# Patient Record
Sex: Female | Born: 1955
Health system: Southern US, Community
[De-identification: ages and names within clinical notes are randomized; demographics above are authoritative.]

## PROBLEM LIST (undated history)

## (undated) DIAGNOSIS — M1990 Unspecified osteoarthritis, unspecified site: Secondary | ICD-10-CM

## (undated) DIAGNOSIS — R011 Cardiac murmur, unspecified: Secondary | ICD-10-CM

## (undated) DIAGNOSIS — L982 Febrile neutrophilic dermatosis [Sweet]: Secondary | ICD-10-CM

## (undated) DIAGNOSIS — F329 Major depressive disorder, single episode, unspecified: Secondary | ICD-10-CM

## (undated) DIAGNOSIS — A64 Unspecified sexually transmitted disease: Secondary | ICD-10-CM

## (undated) DIAGNOSIS — K219 Gastro-esophageal reflux disease without esophagitis: Secondary | ICD-10-CM

## (undated) DIAGNOSIS — F32A Depression, unspecified: Secondary | ICD-10-CM

## (undated) DIAGNOSIS — F419 Anxiety disorder, unspecified: Secondary | ICD-10-CM

## (undated) DIAGNOSIS — S060XAA Concussion with loss of consciousness status unknown, initial encounter: Secondary | ICD-10-CM

## (undated) DIAGNOSIS — K589 Irritable bowel syndrome without diarrhea: Secondary | ICD-10-CM

## (undated) DIAGNOSIS — K602 Anal fissure, unspecified: Secondary | ICD-10-CM

## (undated) DIAGNOSIS — G43909 Migraine, unspecified, not intractable, without status migrainosus: Secondary | ICD-10-CM

## (undated) DIAGNOSIS — Z87442 Personal history of urinary calculi: Secondary | ICD-10-CM

## (undated) DIAGNOSIS — C539 Malignant neoplasm of cervix uteri, unspecified: Secondary | ICD-10-CM

## (undated) DIAGNOSIS — E079 Disorder of thyroid, unspecified: Secondary | ICD-10-CM

## (undated) DIAGNOSIS — Z9289 Personal history of other medical treatment: Secondary | ICD-10-CM

## (undated) DIAGNOSIS — D801 Nonfamilial hypogammaglobulinemia: Secondary | ICD-10-CM

## (undated) DIAGNOSIS — S060X9A Concussion with loss of consciousness of unspecified duration, initial encounter: Secondary | ICD-10-CM

## (undated) DIAGNOSIS — J302 Other seasonal allergic rhinitis: Secondary | ICD-10-CM

## (undated) DIAGNOSIS — E785 Hyperlipidemia, unspecified: Secondary | ICD-10-CM

## (undated) DIAGNOSIS — J45909 Unspecified asthma, uncomplicated: Secondary | ICD-10-CM

## (undated) DIAGNOSIS — B159 Hepatitis A without hepatic coma: Secondary | ICD-10-CM

## (undated) DIAGNOSIS — M359 Systemic involvement of connective tissue, unspecified: Secondary | ICD-10-CM

## (undated) HISTORY — PX: UPPER GASTROINTESTINAL ENDOSCOPY: SHX188

## (undated) HISTORY — DX: Febrile neutrophilic dermatosis (sweet): L98.2

## (undated) HISTORY — PX: COLONOSCOPY: SHX174

## (undated) HISTORY — DX: Disorder of thyroid, unspecified: E07.9

## (undated) HISTORY — DX: Concussion with loss of consciousness of unspecified duration, initial encounter: S06.0X9A

## (undated) HISTORY — DX: Unspecified osteoarthritis, unspecified site: M19.90

## (undated) HISTORY — DX: Unspecified asthma, uncomplicated: J45.909

## (undated) HISTORY — DX: Major depressive disorder, single episode, unspecified: F32.9

## (undated) HISTORY — DX: Systemic involvement of connective tissue, unspecified: M35.9

## (undated) HISTORY — DX: Personal history of urinary calculi: Z87.442

## (undated) HISTORY — DX: Hepatitis a without hepatic coma: B15.9

## (undated) HISTORY — DX: Unspecified sexually transmitted disease: A64

## (undated) HISTORY — PX: ELBOW SURGERY: SHX618

## (undated) HISTORY — DX: Irritable bowel syndrome, unspecified: K58.9

## (undated) HISTORY — DX: Hyperlipidemia, unspecified: E78.5

## (undated) HISTORY — DX: Gastro-esophageal reflux disease without esophagitis: K21.9

## (undated) HISTORY — DX: Cardiac murmur, unspecified: R01.1

## (undated) HISTORY — DX: Malignant neoplasm of cervix uteri, unspecified: C53.9

## (undated) HISTORY — PX: OTHER SURGICAL HISTORY: SHX169

## (undated) HISTORY — DX: Other seasonal allergic rhinitis: J30.2

## (undated) HISTORY — DX: Migraine, unspecified, not intractable, without status migrainosus: G43.909

## (undated) HISTORY — PX: TONSILLECTOMY: SUR1361

## (undated) HISTORY — PX: NASAL SINUS SURGERY: SHX719

## (undated) HISTORY — DX: Anxiety disorder, unspecified: F41.9

## (undated) HISTORY — DX: Concussion with loss of consciousness status unknown, initial encounter: S06.0XAA

## (undated) HISTORY — DX: Nonfamilial hypogammaglobulinemia: D80.1

## (undated) HISTORY — DX: Personal history of other medical treatment: Z92.89

## (undated) HISTORY — PX: FOOT SURGERY: SHX648

## (undated) HISTORY — DX: Depression, unspecified: F32.A

## (undated) HISTORY — DX: Anal fissure, unspecified: K60.2

---

## 1986-07-17 HISTORY — PX: ABDOMINAL HYSTERECTOMY: SHX81

## 1993-07-17 HISTORY — PX: ELBOW SURGERY: SHX618

## 2007-07-18 HISTORY — PX: BREAST LUMPECTOMY: SHX2

## 2007-07-18 LAB — HM COLONOSCOPY

## 2009-05-26 ENCOUNTER — Encounter: Payer: Self-pay | Admitting: Internal Medicine

## 2009-07-30 ENCOUNTER — Encounter: Payer: Self-pay | Admitting: Internal Medicine

## 2009-09-16 ENCOUNTER — Ambulatory Visit: Payer: Self-pay | Admitting: Oncology

## 2009-10-12 ENCOUNTER — Encounter: Payer: Self-pay | Admitting: Internal Medicine

## 2009-10-13 LAB — CBC WITH DIFFERENTIAL/PLATELET
BASO%: 0.5 % (ref 0.0–2.0)
Basophils Absolute: 0 10*3/uL (ref 0.0–0.1)
EOS%: 2.6 % (ref 0.0–7.0)
Eosinophils Absolute: 0.1 10*3/uL (ref 0.0–0.5)
HCT: 39.5 % (ref 34.8–46.6)
HGB: 13.6 g/dL (ref 11.6–15.9)
LYMPH%: 17.7 % (ref 14.0–49.7)
MCH: 34.1 pg — ABNORMAL HIGH (ref 25.1–34.0)
MCHC: 34.4 g/dL (ref 31.5–36.0)
MCV: 99.1 fL (ref 79.5–101.0)
MONO#: 0.5 10*3/uL (ref 0.1–0.9)
MONO%: 9.2 % (ref 0.0–14.0)
NEUT#: 3.4 10*3/uL (ref 1.5–6.5)
NEUT%: 70 % (ref 38.4–76.8)
Platelets: 242 10*3/uL (ref 145–400)
RBC: 3.99 10*6/uL (ref 3.70–5.45)
RDW: 13.3 % (ref 11.2–14.5)
WBC: 4.9 10*3/uL (ref 3.9–10.3)
lymph#: 0.9 10*3/uL (ref 0.9–3.3)

## 2009-10-14 LAB — IGG, IGA, IGM
IgA: 61 mg/dL — ABNORMAL LOW (ref 68–378)
IgG (Immunoglobin G), Serum: 737 mg/dL (ref 694–1618)

## 2009-10-14 LAB — COMPREHENSIVE METABOLIC PANEL
ALT: 14 U/L (ref 0–35)
Albumin: 4.1 g/dL (ref 3.5–5.2)
CO2: 27 mEq/L (ref 19–32)
Calcium: 9.2 mg/dL (ref 8.4–10.5)
Chloride: 102 mEq/L (ref 96–112)
Glucose, Bld: 88 mg/dL (ref 70–99)
Potassium: 4.2 mEq/L (ref 3.5–5.3)
Sodium: 138 mEq/L (ref 135–145)
Total Protein: 6.2 g/dL (ref 6.0–8.3)

## 2009-10-14 LAB — LACTATE DEHYDROGENASE: LDH: 123 U/L (ref 94–250)

## 2009-10-25 ENCOUNTER — Ambulatory Visit (HOSPITAL_COMMUNITY): Admission: RE | Admit: 2009-10-25 | Discharge: 2009-10-25 | Payer: Self-pay | Admitting: Obstetrics & Gynecology

## 2009-10-25 HISTORY — PX: EXCISION VAGINAL CYST: SHX5825

## 2009-10-26 ENCOUNTER — Encounter (INDEPENDENT_AMBULATORY_CARE_PROVIDER_SITE_OTHER): Payer: Self-pay | Admitting: *Deleted

## 2009-10-26 DIAGNOSIS — D801 Nonfamilial hypogammaglobulinemia: Secondary | ICD-10-CM

## 2009-10-26 DIAGNOSIS — C539 Malignant neoplasm of cervix uteri, unspecified: Secondary | ICD-10-CM | POA: Insufficient documentation

## 2009-10-26 DIAGNOSIS — J45909 Unspecified asthma, uncomplicated: Secondary | ICD-10-CM

## 2009-10-26 DIAGNOSIS — M545 Low back pain: Secondary | ICD-10-CM

## 2009-10-26 DIAGNOSIS — J329 Chronic sinusitis, unspecified: Secondary | ICD-10-CM | POA: Insufficient documentation

## 2009-10-26 DIAGNOSIS — E039 Hypothyroidism, unspecified: Secondary | ICD-10-CM | POA: Insufficient documentation

## 2009-10-26 DIAGNOSIS — Z8719 Personal history of other diseases of the digestive system: Secondary | ICD-10-CM | POA: Insufficient documentation

## 2009-10-26 DIAGNOSIS — Z87442 Personal history of urinary calculi: Secondary | ICD-10-CM | POA: Insufficient documentation

## 2009-11-02 ENCOUNTER — Ambulatory Visit: Payer: Self-pay | Admitting: Internal Medicine

## 2009-11-02 DIAGNOSIS — Z8541 Personal history of malignant neoplasm of cervix uteri: Secondary | ICD-10-CM | POA: Insufficient documentation

## 2009-11-03 ENCOUNTER — Encounter: Payer: Self-pay | Admitting: Internal Medicine

## 2009-11-10 ENCOUNTER — Ambulatory Visit: Payer: Self-pay | Admitting: Oncology

## 2009-11-11 ENCOUNTER — Encounter: Payer: Self-pay | Admitting: Internal Medicine

## 2009-12-23 ENCOUNTER — Ambulatory Visit: Payer: Self-pay | Admitting: Oncology

## 2009-12-27 ENCOUNTER — Encounter: Payer: Self-pay | Admitting: Internal Medicine

## 2009-12-27 LAB — IGG, IGA, IGM
IgA: 61 mg/dL — ABNORMAL LOW (ref 68–378)
IgG (Immunoglobin G), Serum: 799 mg/dL (ref 694–1618)
IgM, Serum: 52 mg/dL — ABNORMAL LOW (ref 60–263)

## 2009-12-31 LAB — IGG SUBCLASS 4
IgA: 61 mg/dL — ABNORMAL LOW (ref 68–378)
IgE (Immunoglobulin E), Serum: 12.4 IU/mL (ref 0.0–180.0)
IgG (Immunoglobin G), Serum: 799 mg/dL (ref 694–1618)
IgG Subclass 1: 438 mg/dL (ref 382–929)
IgG Subclass 2: 254 mg/dL (ref 241–700)
IgM, Serum: 52 mg/dL — ABNORMAL LOW (ref 60–263)

## 2010-02-07 ENCOUNTER — Ambulatory Visit: Payer: Self-pay | Admitting: Oncology

## 2010-03-03 ENCOUNTER — Encounter: Payer: Self-pay | Admitting: Internal Medicine

## 2010-03-03 LAB — IGG, IGA, IGM
IgA: 56 mg/dL — ABNORMAL LOW (ref 68–378)
IgG (Immunoglobin G), Serum: 1100 mg/dL (ref 694–1618)
IgM, Serum: 43 mg/dL — ABNORMAL LOW (ref 60–263)

## 2010-03-30 ENCOUNTER — Ambulatory Visit: Payer: Self-pay | Admitting: Internal Medicine

## 2010-04-01 ENCOUNTER — Ambulatory Visit: Payer: Self-pay | Admitting: Oncology

## 2010-04-04 ENCOUNTER — Encounter: Payer: Self-pay | Admitting: Internal Medicine

## 2010-04-09 ENCOUNTER — Emergency Department (HOSPITAL_COMMUNITY): Admission: EM | Admit: 2010-04-09 | Discharge: 2010-04-09 | Payer: Self-pay | Admitting: Emergency Medicine

## 2010-04-11 ENCOUNTER — Telehealth: Payer: Self-pay | Admitting: Internal Medicine

## 2010-05-02 ENCOUNTER — Ambulatory Visit: Payer: Self-pay | Admitting: Oncology

## 2010-05-03 ENCOUNTER — Encounter: Payer: Self-pay | Admitting: Internal Medicine

## 2010-05-03 LAB — CBC WITH DIFFERENTIAL/PLATELET
BASO%: 0.3 % (ref 0.0–2.0)
Basophils Absolute: 0 10*3/uL (ref 0.0–0.1)
EOS%: 3.8 % (ref 0.0–7.0)
Eosinophils Absolute: 0.2 10*3/uL (ref 0.0–0.5)
HCT: 43.5 % (ref 34.8–46.6)
HGB: 14.4 g/dL (ref 11.6–15.9)
LYMPH%: 12.1 % — ABNORMAL LOW (ref 14.0–49.7)
MCH: 32.1 pg (ref 25.1–34.0)
MCHC: 33.2 g/dL (ref 31.5–36.0)
MCV: 96.8 fL (ref 79.5–101.0)
MONO#: 0.4 10*3/uL (ref 0.1–0.9)
MONO%: 7 % (ref 0.0–14.0)
NEUT#: 4.6 10*3/uL (ref 1.5–6.5)
NEUT%: 76.8 % (ref 38.4–76.8)
Platelets: 277 10*3/uL (ref 145–400)
RBC: 4.49 10*6/uL (ref 3.70–5.45)
RDW: 13.4 % (ref 11.2–14.5)
WBC: 6 10*3/uL (ref 3.9–10.3)
lymph#: 0.7 10*3/uL — ABNORMAL LOW (ref 0.9–3.3)

## 2010-06-03 ENCOUNTER — Ambulatory Visit: Payer: Self-pay | Admitting: Oncology

## 2010-06-16 ENCOUNTER — Encounter: Payer: Self-pay | Admitting: Internal Medicine

## 2010-06-28 ENCOUNTER — Ambulatory Visit: Payer: Self-pay | Admitting: Internal Medicine

## 2010-07-21 ENCOUNTER — Telehealth: Payer: Self-pay | Admitting: Internal Medicine

## 2010-07-22 ENCOUNTER — Ambulatory Visit: Payer: Self-pay | Admitting: Oncology

## 2010-07-26 ENCOUNTER — Encounter: Payer: Self-pay | Admitting: Internal Medicine

## 2010-07-26 LAB — IGG, IGA, IGM
IgA: 69 mg/dL (ref 68–378)
IgG (Immunoglobin G), Serum: 976 mg/dL (ref 694–1618)
IgM, Serum: 36 mg/dL — ABNORMAL LOW (ref 60–263)

## 2010-08-08 ENCOUNTER — Telehealth: Payer: Self-pay | Admitting: Internal Medicine

## 2010-08-12 ENCOUNTER — Telehealth: Payer: Self-pay | Admitting: Internal Medicine

## 2010-08-16 NOTE — Miscellaneous (Signed)
Summary: HIPAA Restrictions  HIPAA Restrictions   Imported By: Florinda Marker 11/02/2009 16:19:35  _____________________________________________________________________  External Attachment:    Type:   Image     Comment:   External Document

## 2010-08-16 NOTE — Assessment & Plan Note (Signed)
Summary: new pt recurrent sinus infections   CC:  auto-immunie disorder.  History of Present Illness: Ms. Emily Calhoun is a 55 year old who was referred to me by Dr. 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  She says that she has had problems with recurrent upper respiratory infections for all of her life.  She never knew exactly why until several years ago when she was diagnosed with hypogammaglobulinemia after one of her sons had been found to have the same condition.  She also has one son who has severe hypersensitivity reactions to multiple medications.  She recently moved here from [place] after her husband took a job as Publishing copy with [employer].  I do not have her records from [place].  She recalls being told that her IgA and IgM levels were low and that some other blood tests was hyper or elevated. Shortly after she was diagnosed she was started on intravenous immunoglobulin therapy. She developed hypersensitivity reaction after her initial dose and underwent desensitization.  She has been able to tolerate subsequent doses relatively well except for fatigue for a few days after each dose.Initially she was receiving the immunoglobulin therapy every two weeks.  She recalls having trough levels drawn occasionally before her immunoglobulin was confused.  She began to develop problems with access and had a Port-A-Cath placed about one year ago. Her last dose of IV Ig was in January shortly before she moved. Dr. Shanon Ace, the physician overseeing her therapy in [place] had recommended changing her dosing to once monthly shortly before she moved.  She also recalls some discussion of possibly stopping in the future but no specific decision was made.  She says that she definitely notes improvement since starting the IV Ig.  She feels like the frequency of respiratory infections has been decreased by at least 50%.  She is very concerned about being off of it now and  wants to restart.  Although her respiratory infections in the past have not been life-threatening and she's never had pneumonia, she is very worried about developing infections in the future, especially since she has severe allergies to so many antibiotics.  She has a history of anaphylaxis with diffuse severe itching and throat swelling with metronidazole, azithromycin, clarithromycin, erythromycin, aminoglycosides, vancomycin, levofloxacin, ciprofloxacin, sulfa agents, and penicillins, cephalosporins, ketolides and telithromycin.   Although she has a history of allergies to other quinolone agents she says that she can tolerate moxifloxacin.  She says she can also tolerate tetracycline (she had a bad sun hypersensitivity reaction while on minocycline) and clindamycin.  She is not sure if she is ever been treated with carbapenams or aztreonam.  She has had multiple severe sinus infections and has required 3 separate sinus surgeries.  She had an Aspergillus sinus infection several years ago and that is when she was diagnosed with hypogammaglobulinemia.  She is not sure how she was treated for that infection.  She says she has had one sinus infection since her last dose of IV Ig.  She says that manifested as frontal headache and she notes that" she probably should have had an MRI".  However the headache resolves spontaneously without any specific therapy.  She is due to see Dr. Osborn Coho tomorrow to establish ENT therapy, she is also seeing Dr. Paulino Rily for her primary care needs.  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  She says she cannot recall the last episode  but it sounds like it's been several years.  when she was in to see Dr. Truett Perna recently her immunoglobulin levels were checked.  Her IgG was normal at 737 but her IgA was low at 61 and her IgM was low at 43.  Her CBC and complete metabolic panels were  normal.  She says she has received a pneumococcal vaccine but does not know when the last dose was given.  She always receives seasonal flu vaccine.  Preventive Screening-Counseling & Management  Alcohol-Tobacco     Alcohol drinks/day: 2     Alcohol type: wine     Smoking Status: never  Caffeine-Diet-Exercise     Caffeine use/day: yes     Does Patient Exercise: yes  Safety-Violence-Falls     Seat Belt Use: yes   Current Allergies (reviewed today): ! FLAGYL (METRONIDAZOLE) ! GENTAMICIN SULFATE (GENTAMICIN SULFATE) ! MORPHINE SULFATE (MORPHINE SULFATE) ! LEVAQUIN (LEVOFLOXACIN) ! * MACROLIDES ! SULFA ! PCN ! KEFLEX (CEPHALEXIN) ! CEPHALOSPORINS ! ZITHROMAX (AZITHROMYCIN) ! BIAXIN (CLARITHROMYCIN) ! VANCOCIN HCL (VANCOMYCIN HCL) ! TOBRADEX (TOBRAMYCIN-DEXAMETHASONE) ! KETOCONAZOLE Past History:  Past Medical History: Asthma Cervical cancer, hx of Hypothyroidism Low back pain  Review of Systems       The patient complains of decreased hearing.  The patient denies anorexia, weight loss, weight gain, dyspnea on exertion, prolonged cough, hemoptysis, and enlarged lymph nodes.    Vital Signs:  Patient profile:   55 year old female Height:      62 inches (157.48 cm) Weight:      148.5 pounds (67.50 kg) BMI:     27.26 Temp:     97.8 degrees F oral Pulse rate:   76 / minute BP sitting:   112 / 78  (left arm) Cuff size:   regular  Vitals Entered By: Jennet Maduro RN (November 02, 2009 2:15 PM) CC: auto-immunie disorder Is Patient Diabetic? No Pain Assessment Patient in pain? no      Nutritional Status BMI of 25 - 29 = overweight Nutritional Status Detail appetite "OK"  Have you ever been in a relationship where you felt threatened, hurt or afraid?not asked   Does patient need assistance? Functional Status Self care Ambulation Normal   Physical Exam  General:  alert and overweight-appearing.   Eyes:  vision grossly intact, pupils equal, and pupils round.    Ears:  R ear normal and L ear normal.   Mouth:  good dentition, pharynx pink and moist, no erythema, and no exudates.   Lungs:  normal breath sounds, no crackles, and no wheezes.   Heart:  normal rate, regular rhythm, and no murmur.   Skin:  no rashes and no suspicious lesions.   Cervical Nodes:  no anterior cervical adenopathy and no posterior cervical adenopathy.   Axillary Nodes:  no R axillary adenopathy and no L axillary adenopathy.   Psych:  normally interactive, good eye contact, not anxious appearing, and not depressed appearing.     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  She does meet current guidelines for IVIG therapy.  It would be helpful for me to review Dr. Rhys Martini records and I have requested them from Dr. Truett Perna. I would recommend continuing the dose that she was receiving in [place] and check periodic trough levels in order to determine frequency of dosing. Orders: Consultation Level IV (19147)  Patient Instructions: 1)  Please schedule a follow-up appointment in 6 weeks.  Prevention & Chronic Care Immunizations   Influenza vaccine: Not documented    Tetanus booster: Not documented    Pneumococcal vaccine: Not documented  Colorectal Screening   Hemoccult: Not documented    Colonoscopy: Not documented  Other Screening   Pap smear: Not documented    Mammogram: Not documented   Smoking status: never  (11/02/2009)  Lipids   Total Cholesterol: Not documented   LDL: Not documented   LDL Direct: Not documented   HDL: Not documented   Triglycerides: Not documented

## 2010-08-16 NOTE — Consult Note (Signed)
Summary: [facility] New Pt. Eval.  [facility]   Imported By: Florinda Marker 01/27/2010 08:47:55  _____________________________________________________________________  External Attachment:    Type:   Image     Comment:   External Document

## 2010-08-16 NOTE — Consult Note (Signed)
Summary: New Pt. Referral: [facility]  New Pt. Referral: [facility]   Imported By: Florinda Marker 11/11/2009 09:12:50  _____________________________________________________________________  External Attachment:    Type:   Image     Comment:   External Document

## 2010-08-16 NOTE — Consult Note (Signed)
[facility]   Imported By: Florinda Marker 01/12/2010 15:26:29  _____________________________________________________________________  External Attachment:    Type:   Image     Comment:   External Document

## 2010-08-16 NOTE — Consult Note (Signed)
[facility]: [facility]   Imported By: Florinda Marker 03/31/2010 10:20:09  _____________________________________________________________________  External Attachment:    Type:   Image     Comment:   External Document

## 2010-08-16 NOTE — Miscellaneous (Signed)
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) Added new problem of ASTHMA, EXERCISE INDUCED (ICD-493.81) Added new problem of LOW BACK PAIN, CHRONIC (ICD-724.2) Added new problem of PERSONAL HISTORY OF URINARY CALCULI (ICD-V13.01) Added new problem of HEMORRHOIDS, HX OF (ICD-V12.79) Added new problem of CERVICAL CANCER (ICD-180.9) - Stage II, Hysterectomy in 1988 Added new problem of HYPOTHYROIDISM (ICD-244.9) Added new problem of UNSPECIFIED HEPATITIS (ICD-573.3) - hx of Hep A Medications: Added new medication of SUMATRIPTAN SUCCINATE 25 MG TABS (SUMATRIPTAN SUCCINATE) as needed per PCP Added new medication of VENTOLIN HFA 108 (90 BASE) MCG/ACT AERS (ALBUTEROL SULFATE) Two puffs two times a day per PCP Added new medication of ALVESCO 80 MCG/ACT AERS (CICLESONIDE) two puffs two times a day per PCP Added new medication of DIPHENHYDRAMINE HCL 50 MG CAPS (DIPHENHYDRAMINE HCL) Take 1 capsule by mouth two times a day as needed per PCP Added new medication of DOK 100 MG CAPS (DOCUSATE SODIUM) Take 1 capsule by mouth two times a day as needed per PCP Added new medication of LEXAPRO 20 MG TABS (ESCITALOPRAM OXALATE) Take 1 tablet by mouth once a day per PCP Added new medication of ESTRADERM 0.05 MG/24HR PTTW (ESTRADIOL) Apply 0.075 combination patches two times a day per PCP Added new medication of FAMOTIDINE 20 MG TABS (FAMOTIDINE) Take 1 tablet by mouth two times a day as needed per PCP Added new medication of LEVOTHYROXINE SODIUM 50 MCG TABS (LEVOTHYROXINE SODIUM) Take 1 tablet by mouth once a day per PCP Added new medication of VALACYCLOVIR HCL 500 MG TABS (VALACYCLOVIR HCL) Take 1 tablet by mouth once a day per PCP Added new medication of APAP 325 MG TABS (ACETAMINOPHEN) as needed per PCP Added new medication of CLONAZEPAM 1 MG TABS  (CLONAZEPAM) Take 1 tablet by mouth two times a day, uses 2-3 days prior to IVIG infusions per PCP Added new medication of CLINDAMYCIN PHOSPHATE 1 % GEL (CLINDAMYCIN PHOSPHATE) Apply two times a day as needed per PCP Allergies: Added new allergy or adverse reaction of FLAGYL (METRONIDAZOLE) Added new allergy or adverse reaction of GENTAMICIN SULFATE (GENTAMICIN SULFATE) Added new allergy or adverse reaction of MORPHINE SULFATE (MORPHINE SULFATE) Added new allergy or adverse reaction of LEVAQUIN (LEVOFLOXACIN) Added new allergy or adverse reaction of * MACROLIDES Added new allergy or adverse reaction of SULFA Added new allergy or adverse reaction of PCN Added new allergy or adverse reaction of KETEK (TELITHROMYCIN) Added new allergy or adverse reaction of CEPHALOSPORINS Added new allergy or adverse reaction of ZITHROMAX (AZITHROMYCIN) Added new allergy or adverse reaction of BIAXIN (CLARITHROMYCIN) Added new allergy or adverse reaction of VANCOCIN HCL (VANCOMYCIN HCL) Added new allergy or adverse reaction of TOBRADEX (TOBRAMYCIN-DEXAMETHASONE) Added new allergy or adverse reaction of KETOCONAZOLE Observations: Added new observation of NKA: F (10/26/2009 12:00)

## 2010-08-16 NOTE — Progress Notes (Signed)
Summary: Side effects from therapy.  Phone Note Call from Patient Call back at [facility] Phone [phone]   Caller: Patient Call For: Dr. Cliffton Asters Reason for Call: Acute Illness, Talk to Doctor Summary of Call: Messge left by pt.  Seen in ED for side effects from treatments.  Wanting to talk to MD about this problem.  MD notified of pt. request.  Jennet Maduro RN  April 11, 2010 2:30 PM   Follow-up for Phone Call        I called her number twice but it said the number had been changed or disconnected. Follow-up by: Cliffton Asters MD,  April 11, 2010 5:29 PM  Additional Follow-up for Phone Call Additional follow up Details #1::        I spoke with Emily Calhoun this morning.  She says that she had much worse symptoms and headache after her last IVIG infusion and is considering postponing her next confusion.  She feels like she just cannot tolerate the side effects.  I told her that she is already on so many premedications and I would not know what to add at this point.  I suggested that she talk to Dr. Truett Perna about taking some time off with the infusions. Additional Follow-up by: Cliffton Asters MD,  April 12, 2010 10:53 AM

## 2010-08-16 NOTE — Consult Note (Signed)
[facility]   Imported By: Florinda Marker 05/20/2010 11:16:42  _____________________________________________________________________  External Attachment:    Type:   Image     Comment:   External Document

## 2010-08-16 NOTE — Consult Note (Signed)
Summary: [facility] [place]: Health Care   Imported By: Florinda Marker 01/27/2010 15:23:59  _____________________________________________________________________  External Attachment:    Type:   Image     Comment:   External Document

## 2010-08-16 NOTE — Consult Note (Signed)
Summary: [facility]   Imported By: Florinda Marker 11/11/2009 11:45:16  _____________________________________________________________________  External Attachment:    Type:   Image     Comment:   External Document

## 2010-08-16 NOTE — Consult Note (Signed)
Summary: [facility] [place]: [facility]  [facility] [place]: [facility] Note   Imported By: Florinda Marker 01/27/2010 15:54:29  _____________________________________________________________________  External Attachment:    Type:   Image     Comment:   External Document

## 2010-08-16 NOTE — Consult Note (Signed)
[facility]   Imported By: Florinda Marker 01/27/2010 09:28:53  _____________________________________________________________________  External Attachment:    Type:   Image     Comment:   External Document

## 2010-08-16 NOTE — Assessment & Plan Note (Signed)
Summary: F/U OV/VS   CC:  follow-up visit.  History of Present Illness: Ms. Emily Calhoun is in for her second visit with me.  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  Her condition is further complicated by multiple antibiotic allergies.  She was initially diagnosed and treated in [place] by Emily Calhoun in [place]. She did not tolerate her initial dose of IVIG. Thereafter,  she was managed with very small doses of IVIG following intensive premedication with prochlorperazine, acetaminophen, diphenhydramine, famotidine, cetirizine, meperidine, acetazolamide, sumatriptan, ondansetron, albuterol inhalers, clonazepam and Solu-Medrol.  With this, she was able to tolerate gradually escalating doses on a two week schedule.  It appears the trough immunoglobulin levels were normal in February of this year before she moved here to [place].  After her visit with me in April she resumed IVIG therapy at the [facility] under the direction of Dr. Mancel Calhoun. She  tells me that she does take clonazepam for each monthly dose.  I do not see any indication in Dr. Kalman Calhoun notes that she is receiving any other premedication.  She says that after her last few doses she has been bothered by severe headaches, nausea, fatigue and severe constipation.  She says that this lasts for 5 to 7 days and is very debilitating.  It appears that trough immunoglobulin levels drawn before her last dose of IVIG on August 18 showed a slightly low IgA level of 56, normal IgG level of 1100, and slightly low IgM level of 43.  She does feel that the IVIG infusions have reduced her infections.  She is worried that if she stopped them or reduce the frequency of her infusions she might start to have severe infections again that would be difficult to manage, especially since she has so few antibiotics that she can tolerate.  However she admits that  she does consider reducing the frequency of her infusions in order to reduce the number of days of postinfusion side effects.   Current Allergies (reviewed today): ! FLAGYL (METRONIDAZOLE) ! GENTAMICIN SULFATE (GENTAMICIN SULFATE) ! MORPHINE SULFATE (MORPHINE SULFATE) ! LEVAQUIN (LEVOFLOXACIN) ! * MACROLIDES ! SULFA ! PCN ! KEFLEX (CEPHALEXIN) ! CEPHALOSPORINS ! ZITHROMAX (AZITHROMYCIN) ! BIAXIN (CLARITHROMYCIN) ! VANCOCIN HCL (VANCOMYCIN HCL) ! TOBRADEX (TOBRAMYCIN-DEXAMETHASONE) ! KETOCONAZOLE Vital Signs:  Patient profile:   55 year old female Height:      62 inches (157.48 cm) Weight:      143.25 pounds (65.11 kg) BMI:     26.30 Temp:     98.3 degrees F (36.83 degrees C) oral Pulse rate:   87 / minute BP sitting:   120 / 88  (left arm) Cuff size:   regular  Vitals Entered By: Emily Maduro RN (March 30, 2010 3:40 PM) CC: follow-up visit Is Patient Diabetic? No Pain Assessment Patient in pain? yes     Location: sinus pain Intensity: 4 Type: sorenessin Onset of pain  Intermittent Nutritional Status BMI of 25 - 29 = overweight Nutritional Status Detail appetite "fine"  Have you ever been in a relationship where you felt threatened, hurt or afraid?not asked   Does patient need assistance? Functional Status Self care Ambulation Normal   Physical Exam  General:  alert and well-nourished.   Mouth:  good dentition, pharynx pink and moist, no erythema, and no exudates.   Lungs:  normal breath sounds, no crackles, and no wheezes.   Heart:  normal rate, regular rhythm,  and no murmur.   Psych:  normally interactive, good eye contact, and not depressed appearing.  she is slightly anxious.   Impression & Recommendations:  Problem # 1:  HYPOGAMMAGLOBULINEMIA (ICD-279.00) Emily Calhoun has slightly low trough IgM and IgA levels on monthly IVIG but is not having problems with infections. I spoke with Emily Calhoun, her immunologist/hematologist, in [place]  [phone]). He suggested continuing the monthly dosing since she does have problems tolerating the infusions. I spoke with Emily Calhoun and he agrees with staying with monthly infusions. She is receiving the same, extensive premedications here that she was getting in [place].  Orders: Est. Patient Level IV (09811)  Medications Added to Medication List This Visit: 1)  Monthily Ivig  2)  Promethazine Hcl 25 Mg Tabs (Promethazine hcl) .... As needed 3)  Promethazine Hcl 50 Mg Supp (Promethazine hcl) .... As needed  Patient Instructions: 1)  Please schedule a follow-up appointment in 3 months.

## 2010-08-18 ENCOUNTER — Encounter: Payer: Self-pay | Admitting: Internal Medicine

## 2010-08-18 ENCOUNTER — Encounter (INDEPENDENT_AMBULATORY_CARE_PROVIDER_SITE_OTHER): Payer: 59 | Admitting: Internal Medicine

## 2010-08-18 DIAGNOSIS — D801 Nonfamilial hypogammaglobulinemia: Secondary | ICD-10-CM

## 2010-08-18 DIAGNOSIS — J309 Allergic rhinitis, unspecified: Secondary | ICD-10-CM | POA: Insufficient documentation

## 2010-08-18 DIAGNOSIS — J329 Chronic sinusitis, unspecified: Secondary | ICD-10-CM

## 2010-08-18 NOTE — Progress Notes (Signed)
Summary: Pt. verbalized that she has "viral head cold"  Phone Note Call from Patient Call back at Home Phone [phone]   Caller: Patient Call For: Cliffton Asters MD Reason for Call: Acute Illness Summary of Call: Pt. left message that she completed the antibiotic.  She was seen by Dr. Annalee Genta who did a "culture" which came back negative.  Then, on Jan 19 she started having symptoms of a "viral" infection.  She is not asking for medication at this time but wanted the "viral" infection noted in her chart.  RN returned phone call.  Pt. verbalized that starting Jan. 19 she had a headache, sorethroat, fever up to 101F and drainage down the back of her throat.  Again, she believes this to be "viral."  She verbalized that she is beginning to feel better, just sluggish and tired now.  Jennet Maduro RN  August 08, 2010 9:06 AM

## 2010-08-18 NOTE — Assessment & Plan Note (Signed)
Summary: F/U/VS   [facility]  [facility]  Last seen in November.  History of Present Illness: Emily Calhoun is in for her routine visit.  After her last visit with me in September she decided to stop her monthly IVIG infusions due to the troublesome side effects.  She has had one upper respiratory infection since that time.  She had some low-grade fevers cough productive of yellow sputum and some sinus congestion.  She used her intranasal steroids and managed it symptomatically with complete resolution of symptoms in about 7 days.  She did not require antibiotics.  She has had her annual influenza vaccine.  Preventive Screening-Counseling & Management  Alcohol-Tobacco     Alcohol drinks/day: 2     Alcohol type: wine     Smoking Status: never  Caffeine-Diet-Exercise     Caffeine use/day: yes     Does Patient Exercise: yes  Safety-Violence-Falls     Seat Belt Use: yes   Updated Prior Medication List: SUMATRIPTAN SUCCINATE 25 MG TABS (SUMATRIPTAN SUCCINATE) as needed per PCP VENTOLIN HFA 108 (90 BASE) MCG/ACT AERS (ALBUTEROL SULFATE) Two puffs two times a day per PCP ALVESCO 80 MCG/ACT AERS (CICLESONIDE) two puffs two times a day per PCP DIPHENHYDRAMINE HCL 50 MG CAPS (DIPHENHYDRAMINE HCL) Take 1 capsule by mouth two times a day as needed per PCP DOK 100 MG CAPS (DOCUSATE SODIUM) Take 1 capsule by mouth two times a day as needed per PCP LEXAPRO 20 MG TABS (ESCITALOPRAM OXALATE) Take 1 tablet by mouth once a day per PCP ESTRADERM 0.05 MG/24HR PTTW (ESTRADIOL) Apply 0.075 combination patches two times a day per PCP FAMOTIDINE 20 MG TABS (FAMOTIDINE) Take 1 tablet by mouth two times a day as needed per PCP LEVOTHYROXINE SODIUM 50 MCG TABS (LEVOTHYROXINE SODIUM) Take 1 tablet by mouth once a day per PCP VALACYCLOVIR HCL 500 MG TABS (VALACYCLOVIR HCL) Take 1 tablet by mouth once a day per PCP APAP 325 MG TABS (ACETAMINOPHEN) as needed per PCP CLONAZEPAM 1 MG TABS  (CLONAZEPAM) Take 1 tablet by mouth two times a day, uses 2-3 days prior to IVIG infusions per PCP PROMETHAZINE HCL 25 MG TABS (PROMETHAZINE HCL) as needed PROMETHAZINE HCL 50 MG SUPP (PROMETHAZINE HCL) as needed  Current Allergies (reviewed today): ! FLAGYL (METRONIDAZOLE) ! GENTAMICIN SULFATE (GENTAMICIN SULFATE) ! MORPHINE SULFATE (MORPHINE SULFATE) ! LEVAQUIN (LEVOFLOXACIN) ! * MACROLIDES ! SULFA ! PCN ! KEFLEX (CEPHALEXIN) ! CEPHALOSPORINS ! ZITHROMAX (AZITHROMYCIN) ! BIAXIN (CLARITHROMYCIN) ! VANCOCIN HCL (VANCOMYCIN HCL) ! TOBRADEX (TOBRAMYCIN-DEXAMETHASONE) ! KETOCONAZOLE Vital Signs:  Patient profile:   55 year old female Height:      62 inches (157.48 cm) Weight:      148.25 pounds (67.39 kg) BMI:     27.21 Temp:     97.4 degrees F (36.33 degrees C) oral Pulse rate:   76 / minute BP sitting:   119 / 80  (left arm) Cuff size:   regular  Vitals Entered By: Jennet Maduro RN (June 28, 2010 10:30 AM) CC[facility]  Last seen in November Is Patient Diabetic? No Pain Assessment Patient in pain? no      Nutritional Status BMI of 25 - 29 = overweight Nutritional Status Detail appetite "fine"  Have you ever been in a relationship where you felt threatened, hurt or afraid?not asked today   Does patient need assistance? Functional Status Self care Ambulation Normal   Physical Exam  General:  alert  and well-nourished.   Mouth:  good dentition, pharynx pink and moist, no erythema, and no exudates.   Lungs:  normal breath sounds, no crackles, and no wheezes.   Heart:  normal rate, regular rhythm, and no murmur.   Psych:  normally interactive, good eye contact, not anxious appearing, and not depressed appearing.     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 She has had only one self-limited upper respiratory infection since that  time.  She had repeat lab work on December 1.  Her IgG level remained normal at 863.  Her IgA level was slightly low at 62 but this was a little higher than it was in August.  Her IgM was slightly lower at 26.  I certainly would not treat her lab work and restart the infusions at this time given her fairly severe side effects.  She is in agreement with that plan.  She prefers to follow-up here on an as-needed basis. Orders: Est. Patient Level III (04540)  Patient Instructions: 1)  Please schedule a follow-up appointment as needed.

## 2010-08-18 NOTE — Progress Notes (Signed)
Summary: Pt. has sinus infection  Phone Note Call from Patient Call back at Home Phone [phone]   Caller: Patient Call For: Cliffton Asters MD Reason for Call: Acute Illness, Talk to Doctor Summary of Call: Message left by pt. stating that she was instructed to call when she developed an infection.  She has developed a sinus infection.  Would appreciate a call back from Dr. Orvan Falconer.  Jennet Maduro RN  July 21, 2010 5:15 PM   Follow-up for Phone Call        Please fill Avelox 400 mg daily for 5 days, no refills. Follow-up by: Cliffton Asters MD,  July 22, 2010 7:59 AM    New/Updated Medications: AVELOX 400 MG TABS (MOXIFLOXACIN HCL) .qdtab

## 2010-08-22 ENCOUNTER — Encounter (INDEPENDENT_AMBULATORY_CARE_PROVIDER_SITE_OTHER): Payer: Self-pay | Admitting: *Deleted

## 2010-08-24 NOTE — Progress Notes (Signed)
Summary: Pt. feels she needs placed on steroids  Phone Note Call from Patient Call back at Home Phone [phone]   Caller: Patient Reason for Call: Acute Illness Summary of Call: Pt. believes she is getting worse.  Thinks that she needs to be placed on steroids.  Please advise. Jennet Maduro RN  August 12, 2010 3:47 PM   Follow-up for Phone Call        Please see if she can come in for a visit on Thursday, 08/18/10 at 2:00 pm.  Follow-up by: Cliffton Asters MD,  August 16, 2010 10:02 AM

## 2010-08-24 NOTE — Assessment & Plan Note (Signed)
Vital Signs:  Patient profile:   55 year old female Height:      62 inches (157.48 cm) Weight:      147 pounds (66.82 kg) Pulse rate:   86 / minute BP sitting:   118 / 86  (left arm) Cuff size:   regular  Vitals Entered By: Jennet Maduro RN (August 18, 2010 2:07 PM) CC: productive cough, brown sputum cleared up after antibiotic, saw Dr. Kelli Churn, ENT recommeded continuing Mucinex.  Within the last 2 days has started having nasal dranage and headache Is Patient Diabetic? No Pain Assessment Patient in pain? no      Nutritional Status BMI of 25 - 29 = overweight  Have you ever been in a relationship where you felt threatened, hurt or afraid?not asked today   Does patient need assistance? Functional Status Self care Ambulation Normal   CC:  productive cough, brown sputum cleared up after antibiotic, saw Dr. Kelli Churn, and ENT recommeded continuing Mucinex.  Within the last 2 days has started having nasal dranage and headache.  History of Present Illness: Emily Calhoun is seen on a work in basis. She recently had a flare in her chronic upper respiratory symptoms.  She developed cough productive of dark brown sputum and increased sinus drainage.  I gave her a 5 day course of Avelox and she improved significantly but has had persistent clear sinus drainage greater than her usual amount of with increased sneezing.  It has not been responsive to her chronic use of Sudafed, antihistamines and intranasal steroids.  She is not any fever. She does have her usual chronic cough but no shortness of breath.  She has had some mild headaches which is not unusual for her.  She has not had any serious infections since she stopped giving her monthly IVIG injections and she remains comfortable staying off of the IVIG. She is requesting to have her Port-A-Cath removed.  She has not had any problems with it.  He was last flushed about 5 weeks ago.  She notes that the nurses were unable to draw blood from it the  last several months.  Preventive Screening-Counseling & Management  Alcohol-Tobacco     Alcohol drinks/day: 2     Alcohol type: wine     Smoking Status: never  Caffeine-Diet-Exercise     Caffeine use/day: yes     Does Patient Exercise: yes  Safety-Violence-Falls     Seat Belt Use: yes  Allergies: 1)  ! Flagyl (Metronidazole) 2)  ! Gentamicin Sulfate (Gentamicin Sulfate) 3)  ! Morphine Sulfate (Morphine Sulfate) 4)  ! Levaquin (Levofloxacin) 5)  ! * Macrolides 6)  ! Sulfa 7)  ! Pcn 8)  ! Keflex (Cephalexin) 9)  ! Cephalosporins 10)  ! Zithromax (Azithromycin) 11)  ! Biaxin (Clarithromycin) 12)  ! Vancocin Hcl (Vancomycin Hcl) 13)  ! Tobradex (Tobramycin-Dexamethasone) 14)  ! Ketoconazole  Past History:  Past Medical History: Asthma Cervical cancer, hx of Hypothyroidism Low back pain Allergic rhinitis  Physical Exam  General:  alert and well-nourished.   Nose:  no external erythema and no nasal discharge.   Mouth:  good dentition, pharynx pink and moist, no erythema, and no exudates.   Chest Wall:  her right upper chest Port-A-Cath site looks normal. Lungs:  normal breath sounds, no crackles, and no wheezes.   Heart:  normal rate, regular rhythm, and no murmur.     Impression & Recommendations:  Problem # 1:  SINUSITIS, RECURRENT (ICD-473.9) Her acute upper respiratory  infection has resolved.  She has chronic rhinitis that is probably due to a combination of allergic rhinitis and mucosal abnormalities related to her chronic sinusitis and surgeries.  I have counseled her to be careful with antibiotic and steroid use. Her updated medication list for this problem includes:    Sudafed 30 Mg Tabs (Pseudoephedrine hcl) .Marland Kitchen... Take 1 tablet by mouth two times a day    Flonase 50 Mcg/act Susp (Fluticasone propionate)  Orders: Est. 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  I will make a referral to general surgery. Orders: Est. Patient Level III (60454) Surgical Referral (Surgery)  Medications Added to Medication List This Visit: 1)  Sudafed 30 Mg Tabs (Pseudoephedrine hcl) .... Take 1 tablet by mouth two times a day 2)  Flonase 50 Mcg/act Susp (Fluticasone propionate)  Patient Instructions: 1)  Please schedule a follow-up appointment as needed.   Orders Added: 1)  Est. Patient Level III [09811] 2)  Surgical Referral [Surgery]

## 2010-09-01 ENCOUNTER — Encounter: Payer: Self-pay | Admitting: Internal Medicine

## 2010-09-01 NOTE — Consult Note (Signed)
Summary: [facility]  [facility]   Imported By: Florinda Marker 08/25/2010 15:57:39  _____________________________________________________________________  External Attachment:    Type:   Image     Comment:   External Document

## 2010-09-01 NOTE — Letter (Signed)
Summary: [facility]  [address]   [place], [place] [address]   Phone: [phone]  Fax: [phone]          August 22, 2010  [facility] [address] [place], [place]  [address]  [place]  Dear Ms. Gwynn,  Your appointment at [facility] has been scheduled with:    MD:     Dr. Donell Beers   Date:     September 01, 2009   Time:     3:50 PM arrive at 3:30 PM   Address:   [address], [address]   Tel. #:     [phone]   If you need to reschedule this appointment please call that office at least 24 hours in advance of the appointment.  Thank you.   Sincerely,   Jennet Maduro [facility]

## 2010-09-06 ENCOUNTER — Other Ambulatory Visit: Payer: Self-pay | Admitting: General Surgery

## 2010-09-06 ENCOUNTER — Encounter (HOSPITAL_COMMUNITY): Payer: 59 | Attending: General Surgery

## 2010-09-06 DIAGNOSIS — Z01812 Encounter for preprocedural laboratory examination: Secondary | ICD-10-CM | POA: Insufficient documentation

## 2010-09-06 LAB — CBC
HCT: 41.2 % (ref 36.0–46.0)
Hemoglobin: 14.1 g/dL (ref 12.0–15.0)
MCH: 32.8 pg (ref 26.0–34.0)
MCHC: 34.2 g/dL (ref 30.0–36.0)
MCV: 95.8 fL (ref 78.0–100.0)
Platelets: 252 10*3/uL (ref 150–400)
RBC: 4.3 MIL/uL (ref 3.87–5.11)
RDW: 12.5 % (ref 11.5–15.5)
WBC: 4 10*3/uL (ref 4.0–10.5)

## 2010-09-06 LAB — COMPREHENSIVE METABOLIC PANEL
ALT: 18 U/L (ref 0–35)
AST: 23 U/L (ref 0–37)
Albumin: 4.1 g/dL (ref 3.5–5.2)
Alkaline Phosphatase: 72 U/L (ref 39–117)
BUN: 13 mg/dL (ref 6–23)
CO2: 29 mEq/L (ref 19–32)
Calcium: 9.4 mg/dL (ref 8.4–10.5)
Chloride: 101 mEq/L (ref 96–112)
Creatinine, Ser: 0.76 mg/dL (ref 0.4–1.2)
GFR calc Af Amer: >60 mL/min (ref >60)
GFR calc non Af Amer: >60 mL/min (ref >60)
Glucose, Bld: 101 mg/dL — ABNORMAL HIGH (ref 70–99)
Potassium: 4.4 mEq/L (ref 3.5–5.1)
Sodium: 135 mEq/L (ref 135–145)
Total Bilirubin: 0.7 mg/dL (ref 0.3–1.2)
Total Protein: 7.1 g/dL (ref 6.0–8.3)

## 2010-09-06 LAB — SURGICAL PCR SCREEN
MRSA, PCR: NEGATIVE
Staphylococcus aureus: NEGATIVE

## 2010-09-07 ENCOUNTER — Ambulatory Visit (HOSPITAL_COMMUNITY)
Admission: RE | Admit: 2010-09-07 | Discharge: 2010-09-07 | Disposition: A | Discharge status: Home / Self Care | Payer: 59 | Source: Ambulatory Visit | Attending: General Surgery | Admitting: General Surgery

## 2010-09-07 DIAGNOSIS — Z79899 Other long term (current) drug therapy: Secondary | ICD-10-CM | POA: Insufficient documentation

## 2010-09-07 DIAGNOSIS — D801 Nonfamilial hypogammaglobulinemia: Secondary | ICD-10-CM | POA: Insufficient documentation

## 2010-09-07 DIAGNOSIS — F411 Generalized anxiety disorder: Secondary | ICD-10-CM | POA: Insufficient documentation

## 2010-09-07 DIAGNOSIS — Z452 Encounter for adjustment and management of vascular access device: Secondary | ICD-10-CM | POA: Insufficient documentation

## 2010-09-07 DIAGNOSIS — J45909 Unspecified asthma, uncomplicated: Secondary | ICD-10-CM | POA: Insufficient documentation

## 2010-09-15 HISTORY — PX: BREAST REDUCTION SURGERY: SHX8

## 2010-09-22 NOTE — Op Note (Signed)
  [name], [name]                  ACCOUNT NO.:  [id number]  MEDICAL RECORD NO.:  [id number]           PATIENT TYPE:  O  LOCATION:  DAYL                         FACILITY:  [facility]  PHYSICIAN:  Almond Lint, MD       DATE OF BIRTH:  11/27/1955  DATE OF PROCEDURE:  09/07/2010 DATE OF DISCHARGE:                              OPERATIVE REPORT   PREOPERATIVE DIAGNOSIS:  Hypogammaglobulinemia.  POSTOPERATIVE DIAGNOSIS:  Hypogammaglobulinemia.  PROCEDURE:  Removal of right subclavian Port-A-Cath.  SURGEON:  Almond Lint, MD  ANESTHESIA:  General and local.  FINDINGS:  Port removed easily, 12 cm of catheter was removed with angled cut at the end.  SPECIMEN:  None.  BLOOD LOSS:  Minimal.  COMPLICATIONS:  None known.  PROCEDURE IN DETAIL:  [name] was identified in the holding area and taken to operating room where she was placed supine on the operating room table.  General anesthesia was induced.  Her right upper chest was prepped and draped in sterile fashion.  Her prior incision site was infiltrated with local anesthetic.  The old incision was opened and dissection commenced through the subcutaneous tissues with the cautery. Once the capsule of the Port-A-Cath was identified, a Horticulturist, commercial was placed in for visualization and the Port-A-Cath capsule was opened with Metzenbaum scissors.  The Port-A-Cath was elevated with a Kocher and this came out easily from the vein.  The capsule was then removed from the incision with cautery.  The edges of the wound were trimmed back, and the wound was closed with interrupted 3-0 Vicryl deep dermal sutures and running Monocryl for subcuticular suture.  The wound was then cleaned, dried and dressed with Dermabond.  The patient was tolerated the procedure well.  Needle, sponge and instrument counts were correct x2, and she was taken to PACU in stable condition.     Almond Lint, MD    FB/MEDQ  D:  09/07/2010  T:  09/07/2010   Job:  [id number]  Electronically Signed by Almond Lint MD on 09/21/2010 12:44:50 PM

## 2010-09-23 ENCOUNTER — Encounter: Payer: Self-pay | Admitting: *Deleted

## 2010-09-27 NOTE — Consult Note (Signed)
Summary: Consultation Report  Consultation Report   Imported By: Florinda Marker 09/23/2010 09:04:23  _____________________________________________________________________  External Attachment:    Type:   Image     Comment:   External Document

## 2010-10-05 LAB — URINALYSIS, ROUTINE W REFLEX MICROSCOPIC
Glucose, UA: NEGATIVE mg/dL
Ketones, ur: NEGATIVE mg/dL
Protein, ur: NEGATIVE mg/dL
Urobilinogen, UA: 0.2 mg/dL (ref 0.0–1.0)

## 2010-10-05 LAB — CBC
Hemoglobin: 13.8 g/dL (ref 12.0–15.0)
MCHC: 34.8 g/dL (ref 30.0–36.0)
RBC: 4.04 MIL/uL (ref 3.87–5.11)
WBC: 4.7 10*3/uL (ref 4.0–10.5)

## 2010-10-05 LAB — URINE MICROSCOPIC-ADD ON

## 2010-10-07 NOTE — Progress Notes (Signed)
This encounter was created in error - please disregard.

## 2010-10-18 ENCOUNTER — Encounter (HOSPITAL_BASED_OUTPATIENT_CLINIC_OR_DEPARTMENT_OTHER): Payer: 59 | Admitting: Oncology

## 2010-10-18 DIAGNOSIS — R1031 Right lower quadrant pain: Secondary | ICD-10-CM

## 2010-10-18 DIAGNOSIS — D801 Nonfamilial hypogammaglobulinemia: Secondary | ICD-10-CM

## 2010-11-15 LAB — HM PAP SMEAR

## 2011-02-21 ENCOUNTER — Encounter: Payer: Self-pay | Admitting: Internal Medicine

## 2011-02-21 ENCOUNTER — Ambulatory Visit (INDEPENDENT_AMBULATORY_CARE_PROVIDER_SITE_OTHER): Payer: 59 | Admitting: Internal Medicine

## 2011-02-21 DIAGNOSIS — J069 Acute upper respiratory infection, unspecified: Secondary | ICD-10-CM

## 2011-02-21 DIAGNOSIS — R21 Rash and other nonspecific skin eruption: Secondary | ICD-10-CM | POA: Insufficient documentation

## 2011-02-21 DIAGNOSIS — R000 Tachycardia, unspecified: Secondary | ICD-10-CM | POA: Insufficient documentation

## 2011-02-21 NOTE — Assessment & Plan Note (Signed)
I do not know the cause of her rash and doubt that it is related to the viral URI. I would simply follow it at this time.

## 2011-02-21 NOTE — Assessment & Plan Note (Signed)
I suggested that she return to see her primary care doctor about her tachycardia and concerns that it might be related to the Wellbutrin. She has not noticed any improvement as she has tapered down on the dose of Wellbutrin.

## 2011-02-21 NOTE — Assessment & Plan Note (Signed)
It is quite likely that she her husband are suffering from the same viral upper respiratory infection. She is not febrile today and I did not see any reason to start empiric antibiotic therapy. I suggested that she continue symptomatic therapy with Tylenol and had Afrin for her sinus congestion, rhinorrhea and cough. She should do that for the next 3-4 days. She has not really been having more frequent respiratory infections since stopping the IV Ig late last year. I would recommend that she have complete immunoglobulin levels once she is fully recovered from this illness to see if she is hypogammaglobulinemic.

## 2011-02-21 NOTE — Progress Notes (Signed)
  Subjective:    Patient ID: Emily Calhoun, female    DOB: [birth year], 55 y.o.   MRN: [id number]  HPI Emily Calhoun is seen on a work in basis. She and her husband traveled back to [place] he recently to visit her son. About one week ago just as they were returning she developed subjective fevers, mild chills, rhinorrhea and cough productive of clear white sputum. She's been taking Tylenol PM and hydroxyzine to treat her symptoms. She feels a little bit better today and feels like this is the first day she has not had any fever. Her husband Emily Calhoun is had a similar illness.  She's also noted a mildly pruritic rash on her left upper back the began about the same time as her respiratory illness.  Her only change in medication since her last visit was May he has been that she stopped her Lexapro was started on Wellbutrin. About that same time she began to notice some heat intolerance and tachycardia when she was riding her horse. She states that her heart rate has been getting up to about 160 while riding. She says that's a dramatic difference and that she used to be able to tolerate the same level of activity without any noticeable tachycardia. She states she had an echocardiogram done and was told that was normal. She does have some mild shortness of breath when her heart rate is up but does not feel like she's having a flare of exercise-induced asthma.    Review of Systems     Objective:   Physical Exam  Constitutional: No distress.       She sounds congested.  HENT:  Right Ear: External ear normal.  Left Ear: External ear normal.  Mouth/Throat: Oropharynx is clear and moist. No oropharyngeal exudate.  Eyes: Conjunctivae are normal.  Cardiovascular: Normal rate, regular rhythm and normal heart sounds.   No murmur heard. Pulmonary/Chest: Breath sounds normal. She has no wheezes. She has no rales.  Skin:       She has a cluster of slightly excoriated papules on her left upper back.          Assessment & Plan:

## 2011-02-27 ENCOUNTER — Ambulatory Visit: Payer: 59 | Admitting: Internal Medicine

## 2011-02-28 ENCOUNTER — Telehealth: Payer: Self-pay | Admitting: *Deleted

## 2011-02-28 NOTE — Telephone Encounter (Signed)
Patient called c/o ongoing URI symptoms.  Said she has called her PCP and has an appointment to see him. Wendall Mola CMA

## 2011-03-21 ENCOUNTER — Encounter: Payer: Self-pay | Admitting: Internal Medicine

## 2011-03-21 ENCOUNTER — Ambulatory Visit (INDEPENDENT_AMBULATORY_CARE_PROVIDER_SITE_OTHER): Payer: 59 | Admitting: Internal Medicine

## 2011-03-21 ENCOUNTER — Ambulatory Visit (INDEPENDENT_AMBULATORY_CARE_PROVIDER_SITE_OTHER)
Admission: RE | Admit: 2011-03-21 | Discharge: 2011-03-21 | Disposition: A | Discharge status: Home / Self Care | Payer: 59 | Source: Ambulatory Visit | Attending: Internal Medicine | Admitting: Internal Medicine

## 2011-03-21 VITALS — BP 110/76 | HR 86 | Temp 97.9°F | Ht 63.0 in | Wt 148.4 lb

## 2011-03-21 DIAGNOSIS — J45990 Exercise induced bronchospasm: Secondary | ICD-10-CM

## 2011-03-21 DIAGNOSIS — D801 Nonfamilial hypogammaglobulinemia: Secondary | ICD-10-CM

## 2011-03-21 NOTE — Progress Notes (Signed)
  Subjective:    Patient ID: Emily Calhoun, female    DOB: May 16, 1956, 55 y.o.   MRN: [id number]  HPI  55 yowf  Daughter of GP never smoked but grew up in house of smokers with lots of ear infections and sore throat assoc with breathing difficulties and allergy tests neg as adult multiple times "always neg"  with low IgA and  IgM borderline by w/u in  Immunology at [facility] 2010 rec infusions of gammaglobulin not sure if they helped but if anything helped x maybe reduced sinus fungal infections but did not help breathing at all  then stopped them completely November 2011 due to systemic reactions > referred to pulmonary clinic by [facility] in Sept 2012    03/21/2011 Initial pulmonary office eval cc not happy with" breathing control" x 15  Years no better with advair,  alvesco albuterol. Main concern is  Poor heat tolerance can do eliptical trainer fine x 45 min as long as stays cool but does have   Some coughing when supine but not typically any spells of breathing difficulty.  Intermittent nasal congestion does not appear to correlate with breathing difficulties. No purulent sputum at present  Sleeping ok without nocturnal  or early am exac of resp c/o's or need for noct saba.   Pt denies any significant sore throat, dysphagia, itching, sneezing,    excess/ purulent  Nasal secretions,  fever, chills, sweats, unintended wt loss, pleuritic or exertional cp, hempoptysis, orthopnea pnd or leg swelling.    Also denies any obvious fluctuation of symptoms with weather or environmental changes or other aggravating or alleviating factors other than as outlined above.    Review of Systems  Constitutional: Negative for fever, chills and unexpected weight change.  HENT: Positive for ear pain and congestion. Negative for nosebleeds, sore throat, rhinorrhea, sneezing, trouble swallowing, dental problem, voice change, postnasal drip and sinus pressure.   Eyes: Negative for visual disturbance.  Respiratory:  Positive for cough and shortness of breath. Negative for choking.   Cardiovascular: Positive for chest pain. Negative for leg swelling.  Gastrointestinal: Negative for vomiting, abdominal pain and diarrhea.  Genitourinary: Negative for difficulty urinating.  Musculoskeletal: Positive for arthralgias.  Skin: Negative for rash.  Neurological: Positive for headaches. Negative for tremors and syncope.  Hematological: Does not bruise/bleed easily.       Objective:   Physical Exam  Anxious but quite pleasant amb  wf nad  Wt 03/21/2011  148  HEENT: nl dentition, turbinates, and orophanx. Nl external ear canals without cough reflex   NECK :  without JVD/Nodes/TM/ nl carotid upstrokes bilaterally   LUNGS: no acc muscle use, clear to A and P bilaterally without cough on insp or exp maneuvers   CV:  RRR  no s3 or murmur or increase in P2, no edema   ABD:  soft and nontender with nl excursion in the supine position. No bruits or organomegaly, bowel sounds nl  MS:  warm without deformities, calf tenderness, cyanosis or clubbing  SKIN: warm and dry without lesions    NEURO:  alert, approp, no deficits        Assessment & Plan:

## 2011-03-21 NOTE — Patient Instructions (Addendum)
Symbicort Take 2 puffs first thing in am and then another 2 puffs about 12 hours later.   Stop the alvesco and only use the albuterol in an emergency  Work on perfecting  inhaler technique:  relax and gently blow all the way out then take a nice smooth deep breath back in, triggering the inhaler at same time you start breathing in.  Hold for up to 5 seconds if you can.  Rinse and gargle with water when done   If your mouth or throat starts to bother you,   I suggest you time the inhaler to your dental care and after using the inhaler(s) brush teeth and tongue with a baking soda containing toothpaste and when you rinse this out, gargle with it first to see if this helps your mouth and throat.     Pepcid 20 mg after bfast and at bedtime for now automatically  GERD (REFLUX)  is an extremely common cause of respiratory symptoms, many times with no significant heartburn at all.    It can be treated with medication, but also with lifestyle changes including avoidance of late meals, excessive alcohol, smoking cessation, and avoid fatty foods, chocolate, peppermint, colas, red wine, and acidic juices such as orange juice.  NO MINT OR MENTHOL PRODUCTS SO NO COUGH DROPS  USE SUGARLESS CANDY INSTEAD (jolley ranchers or Stover's)  NO OIL BASED VITAMINS - use powdered substitutes.   Please schedule a follow up office visit in 2  weeks, sooner if needed     .

## 2011-03-22 ENCOUNTER — Telehealth: Payer: Self-pay | Admitting: Internal Medicine

## 2011-03-22 ENCOUNTER — Encounter: Payer: Self-pay | Admitting: Internal Medicine

## 2011-03-22 NOTE — Telephone Encounter (Signed)
It was not elevated here and should not go up on symbicort either esp if find the need for ventolin goes down but it is also influenced by caffeine and sudafed which she should avoid.   If persists we can do a 24 hour holter monitor

## 2011-03-22 NOTE — Progress Notes (Signed)
Quick Note:  Spoke with pt and notified of results per Dr. Wert. Pt verbalized understanding and denied any questions.  ______ 

## 2011-03-22 NOTE — Assessment & Plan Note (Signed)
No apparent benefit on lower respiratory symptoms ? Helped sinus dz per pt's hx  > defer decision to rx to ID/ Dr Orvan Falconer

## 2011-03-22 NOTE — Telephone Encounter (Signed)
Spoke with pt to give her cxr results. She verbalized understanding of these results, but states that she was wanting to ask MW at ov yesterday why her heart rate might be increased so much. She states that she has seen her PCP about this, and she told pt that she did not know the cause, and should ask MW at [facility] but she forgot. Please advise recs thanks

## 2011-03-22 NOTE — Telephone Encounter (Signed)
I spoke with pt and she states she is going to monitor her heart rate

## 2011-03-22 NOTE — Assessment & Plan Note (Addendum)
Symptoms are markedly disproportionate to objective findings and not clear this is a lung problem but pt does appear to have difficult airway management issues.  DDX of  difficult airways managment all start with A and  include Adherence, Ace Inhibitors, Acid Reflux, Active Sinus Disease, Alpha 1 Antitripsin deficiency, Anxiety masquerading as Airways dz,  ABPA,  allergy(esp in young), Aspiration (esp in elderly), Adverse effects of DPI,  Active smokers, plus two Bs  = Bronchiectasis and Beta blocker use..and one C= CHF   Adherence is always the initial "prime suspect" and is a multilayered concern that requires a "trust but verify" approach in every patient - starting with knowing how to use medications, especially inhalers, correctly, keeping up with refills and understanding the fundamental difference between maintenance and prns vs those medications only taken for a very short course and then stopped and not refilled. The proper method of use, as well as anticipated side effects, of this metered-dose inhaler are discussed and demonstrated to the patient. Improved to 75% with coaching, has already "failed" advair and alvesco per hx  Try symbicort 160 2bid x 2 weeks then return to regroup  ? Acid reflux > See instructions for specific recommendations which were reviewed directly with the patient who was given a copy with highlighter outlining the key components.   ? Anxiety > dx of exclusion

## 2011-03-24 ENCOUNTER — Institutional Professional Consult (permissible substitution): Payer: 59 | Admitting: Pulmonary Disease

## 2011-04-05 ENCOUNTER — Telehealth: Payer: Self-pay | Admitting: Internal Medicine

## 2011-04-05 ENCOUNTER — Encounter: Payer: Self-pay | Admitting: Internal Medicine

## 2011-04-05 ENCOUNTER — Ambulatory Visit (INDEPENDENT_AMBULATORY_CARE_PROVIDER_SITE_OTHER): Payer: 59 | Admitting: Internal Medicine

## 2011-04-05 DIAGNOSIS — J45909 Unspecified asthma, uncomplicated: Secondary | ICD-10-CM

## 2011-04-05 MED ORDER — ESOMEPRAZOLE MAGNESIUM 40 MG PO CPDR: 40.0000 mg | DELAYED_RELEASE_CAPSULE | Freq: Every day | ORAL | Status: DC

## 2011-04-05 NOTE — Progress Notes (Signed)
  Subjective:    Patient ID: Emily Calhoun, female    DOB: 1955/10/06, 55 y.o.   MRN: [id number]  HPI  61 yowf  Daughter of GP never smoked but grew up in house of smokers with lots of ear infections and sore throat assoc with breathing difficulties and allergy tests neg as adult multiple times "always neg"  with low IgA and  IgM borderline by w/u in  Immunology at [facility] 2010 rec infusions of gammaglobulin not sure if they helped but if anything helped x maybe reduced sinus fungal infections but did not help breathing at all  then stopped them completely November 2011 due to systemic reactions > referred to pulmonary clinic by [facility] in Sept 2012    03/21/2011 Initial pulmonary office eval cc not happy with" breathing control" x 15  Years no better with advair,  alvesco albuterol. 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  Intermittent nasal congestion does not appear to correlate with breathing difficulties. No purulent sputum at present. rec Symbicort Take 2 puffs first thing in am and then another 2 puffs about 12 hours later.   Stop the alvesco and only use the albuterol in an emergency  Work on perfecting  inhaler technique:   .     Pepcid 20 mg after bfast and at bedtime for now automatically  GERD diet    04/05/2011 f/u ov/Avaiyah Strubel cc no change in tolerance to riding in heat (could not catch her breath assoc with loss of voice) despite using symbicort 2 puff 12 hours then then 2 puffs before event. No problem with indoor aerobics   Sleeping ok without nocturnal  or early am exac of resp c/o's or need for noct saba.   Pt denies any significant sore throat, dysphagia, itching, sneezing,    excess/ purulent  Nasal secretions,  fever, chills, sweats, unintended wt loss, pleuritic or exertional cp, hempoptysis, orthopnea pnd or leg  swelling.    Also denies any obvious fluctuation of symptoms with weather or environmental changes or other aggravating or alleviating factors other than as outlined above.          Objective:   Physical Exam  Anxious but quite pleasant amb  wf nad  Wt 03/21/2011  148 > 04/05/2011  148   HEENT: nl dentition, turbinates, and orophanx. Nl external ear canals without cough reflex   NECK :  without JVD/Nodes/TM/ nl carotid upstrokes bilaterally   LUNGS: no acc muscle use, clear to A and P bilaterally without cough on insp or exp maneuvers   CV:  RRR  no s3 or murmur or increase in P2, no edema   ABD:  soft and nontender with nl excursion in the supine position. No bruits or organomegaly, bowel sounds nl  MS:  warm without deformities, calf tenderness, cyanosis or clubbing    cxr 03/22/11 Significant bronchitic changes.  2. No focal pulmonary abnormality.     Assessment & Plan:

## 2011-04-05 NOTE — Telephone Encounter (Signed)
UNNECESSARY  

## 2011-04-05 NOTE — Assessment & Plan Note (Signed)
Symptoms are refractory to symbicort despite adequate hfa demonstrated.  Symptoms are markedly disproportionate to objective findings and not clear this is a lung problem but pt does appear to have difficult airway management issues.   DDX of  difficult airways managment all start with A and  include Adherence, Ace Inhibitors, Acid Reflux, Active Sinus Disease, Alpha 1 Antitripsin deficiency, Anxiety masquerading as Airways dz,  ABPA,  allergy(esp in young), Aspiration (esp in elderly), Adverse effects of DPI,  Active smokers, plus two Bs  = Bronchiectasis and Beta blocker use..and one C= CHF   ? Acid reflux/ vcd > needs max rx then methacholine challenge while on acid rx to make sure this is really asthma'  ? Anxiety> dx of exclusion

## 2011-04-05 NOTE — Patient Instructions (Addendum)
Be sure to keep using symbicort  Take 2 puffs first thing in am and then another 2 puffs about 12 hours later.    Only use your albuterol as a rescue medication to be used if you can't catch your breath by resting or doing a relaxed purse lip breathing pattern. The less you use it, the better it will work when you need it.   Start nexium 40 mg Take 30-60 min before first meal of the day   Continue pepcid 20 mg one at bedtime  We will  schedule a methacholine challenge in 2 weeks, no sooner, to give the medications for reflux a chance to work  Please schedule a follow up office visit in 3  weeks, sooner if needed

## 2011-04-19 ENCOUNTER — Ambulatory Visit (HOSPITAL_COMMUNITY)
Admission: RE | Admit: 2011-04-19 | Discharge: 2011-04-19 | Disposition: A | Discharge status: Home / Self Care | Payer: 59 | Source: Ambulatory Visit | Attending: Internal Medicine | Admitting: Internal Medicine

## 2011-04-19 DIAGNOSIS — J45909 Unspecified asthma, uncomplicated: Secondary | ICD-10-CM | POA: Insufficient documentation

## 2011-04-20 ENCOUNTER — Ambulatory Visit (INDEPENDENT_AMBULATORY_CARE_PROVIDER_SITE_OTHER): Payer: 59 | Admitting: Internal Medicine

## 2011-04-20 ENCOUNTER — Encounter: Payer: Self-pay | Admitting: Internal Medicine

## 2011-04-20 DIAGNOSIS — J45909 Unspecified asthma, uncomplicated: Secondary | ICD-10-CM

## 2011-04-20 MED ORDER — MONTELUKAST SODIUM 10 MG PO TABS: 10.0000 mg | ORAL_TABLET | Freq: Every day | ORAL | Status: DC

## 2011-04-20 MED ORDER — LEVALBUTEROL TARTRATE 45 MCG/ACT IN AERO: 1.0000 | INHALATION_SPRAY | RESPIRATORY_TRACT | Status: DC | PRN

## 2011-04-20 MED ORDER — BUDESONIDE-FORMOTEROL FUMARATE 160-4.5 MCG/ACT IN AERO: INHALATION_SPRAY | RESPIRATORY_TRACT | Status: DC

## 2011-04-20 NOTE — Patient Instructions (Addendum)
Try adding singulair 10 mg one each pm - this should improve your sinus problems and your breathing, but you have to take it every day for a month  Continue symbicort 160 mg Take 2 puffs first thing in am and then another 2 puffs about 12 hours later.   Only use xopenex (rescue) when you really need it for breathing and stop the other forms of albuterol  Only use zyrtec as needed for runny nose as needed   Please schedule a follow up office visit in 4 weeks, sooner if needed

## 2011-04-20 NOTE — Progress Notes (Signed)
Subjective:    Patient ID: Emily Calhoun, female    DOB: [birth year], 55 y.o.   MRN: [id number]  HPI  52 yowf  Daughter of GP never smoked but grew up in house of smokers with lots of ear infections and sore throat assoc with breathing difficulties and allergy tests neg as adult multiple times "always neg"  with low IgA and  IgM borderline by w/u in  Immunology at [facility] 2010 rec infusions of gammaglobulin not sure if they helped but if anything helped x maybe reduced sinus fungal infections but did not help breathing at all  then stopped them completely November 2011 due to systemic reactions > referred to pulmonary clinic by [facility] in Sept 2012    03/21/2011 Initial pulmonary office eval cc not happy with" breathing control" x 15  Years no better with advair,  alvesco albuterol. 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  Intermittent nasal congestion does not appear to correlate with breathing difficulties. No purulent sputum at present. rec Symbicort Take 2 puffs first thing in am and then another 2 puffs about 12 hours later.   Stop the alvesco and only use the albuterol in an emergency  Work on perfecting  inhaler technique:   .     Pepcid 20 mg after bfast and at bedtime for now automatically  GERD diet    04/05/2011 f/u ov/Emily Calhoun cc no change in tolerance to riding in heat (could not catch her breath assoc with loss of voice) despite using symbicort 2 puff 12 hours then then 2 puffs before event. No problem with indoor aerobics rec Be sure to keep using symbicort  Take 2 puffs first thing in am and then another 2 puffs about 12 hours later.    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 Could not articulate what the other 50% of symptoms she experiences but may be related to tachcardia from so much saba use before activity. Has never tried singulair.   Sleeping ok without nocturnal  or early am exac of resp c/o's or need for noct saba.   Pt denies any significant sore throat, dysphagia, itching, sneezing,    excess/ purulent  Nasal secretions,  fever, chills, sweats, unintended wt loss, pleuritic or exertional cp, hempoptysis, orthopnea pnd or leg swelling.    Also denies any obvious fluctuation of symptoms with weather or environmental changes or other aggravating or alleviating factors other than as outlined above.          Objective:   Physical Exam  Anxious but quite pleasant amb  wf nad  Wt 03/21/2011  148 > 04/05/2011 >  148 04/20/2011    HEENT: nl dentition, turbinates, and orophanx. Nl external ear canals without cough reflex   NECK :  without JVD/Nodes/TM/ nl carotid upstrokes bilaterally   LUNGS: no acc muscle use, clear to  A and P bilaterally without cough on insp or exp maneuvers   CV:  RRR  no s3 or murmur or increase in P2, no edema   ABD:  soft and nontender with nl excursion in the supine position. No bruits or organomegaly, bowel sounds nl  MS:  warm without deformities, calf tenderness, cyanosis or clubbing    cxr 03/22/11  Significant bronchitic changes.  2. No focal pulmonary abnormality.     Assessment & Plan:

## 2011-04-20 NOTE — Assessment & Plan Note (Signed)
Not clear why only 50% of her symptoms are reproduced with MCT but she clearly has asthma with Symptoms are markedly disproportionate to objective findings and not clear this is a lung problem but pt does appear to have difficult airway management issues.   DDX of  difficult airways managment all start with A and  include Adherence, Ace Inhibitors, Acid Reflux, Active Sinus Disease, Alpha 1 Antitripsin deficiency, Anxiety masquerading as Airways dz,  ABPA,  allergy(esp in young), Aspiration (esp in elderly), Adverse effects of DPI,  Active smokers, plus two Bs  = Bronchiectasis and Beta blocker use..and one C= CHF   Adherence is always the initial "prime suspect" and is a multilayered concern that requires a "trust but verify" approach in every patient - starting with knowing how to use medications, especially inhalers, correctly, keeping up with refills and understanding the fundamental difference between maintenance and prns vs those medications only taken for a very short course and then stopped and not refilled.  The proper method of use, as well as anticipated side effects, of this metered-dose inhaler are discussed and demonstrated to the patient. Improved to 90% with coaching  Try singulair as new maint rx and use prn xopenex and zyrtec as per avs instructions for specific recommendations which were reviewed directly with the patient who was given a copy with highlighter outlining the key components.

## 2011-05-16 ENCOUNTER — Encounter: Payer: Self-pay | Admitting: Internal Medicine

## 2011-05-16 ENCOUNTER — Ambulatory Visit (INDEPENDENT_AMBULATORY_CARE_PROVIDER_SITE_OTHER): Payer: 59 | Admitting: Internal Medicine

## 2011-05-16 DIAGNOSIS — J45909 Unspecified asthma, uncomplicated: Secondary | ICD-10-CM

## 2011-05-16 MED ORDER — MOMETASONE FURO-FORMOTEROL FUM 200-5 MCG/ACT IN AERO: INHALATION_SPRAY | RESPIRATORY_TRACT | Status: DC

## 2011-05-16 NOTE — Patient Instructions (Signed)
The only change I recommend is to use dulera 200 Take 2 puffs first thing in am and then another 2 puffs about 12 hours later instead of symbicort   If not satisfied you need a cpst with spirometry before and after exercise using the dulera the morning of the study.  Continue to take the reflux medications and diet.

## 2011-05-16 NOTE — Progress Notes (Signed)
Subjective:    Patient ID: Leeroy Cha, female    DOB: [birth year], 55 y.o.   MRN: [id number]  HPI  50 yowf  Daughter of GP never smoked but grew up in house of smokers with lots of ear infections and sore throat assoc with breathing difficulties and allergy tests neg as adult multiple times "always neg"  with low IgA and  IgM borderline by w/u in  Immunology at [facility] 2010 rec infusions of gammaglobulin not sure if they helped but if anything helped x maybe reduced sinus fungal infections but did not help breathing at all  then stopped them completely November 2011 due to systemic reactions > referred to pulmonary clinic by [facility] in Sept 2012    03/21/2011 Initial pulmonary office eval cc not happy with" breathing control" x 15  Years no better with advair,  alvesco albuterol. 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  Intermittent nasal congestion does not appear to correlate with breathing difficulties. No purulent sputum at present. rec Symbicort Take 2 puffs first thing in am and then another 2 puffs about 12 hours later.   Stop the alvesco and only use the albuterol in an emergency  Work on perfecting  inhaler technique:   .     Pepcid 20 mg after bfast and at bedtime for now automatically  GERD diet    04/05/2011 f/u ov/Wert cc no change in tolerance to riding in heat (could not catch her breath assoc with loss of voice) despite using symbicort 2 puff 12 hours then then 2 puffs before event. No problem with indoor aerobics rec Be sure to keep using symbicort  Take 2 puffs first thing in am and then another 2 puffs about 12 hours later.   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 Could not articulate what the other 50% of symptoms she experiences but may be related to tachcardia from so much saba use before activity. Has never tried singulair.  rec Try adding singulair 10 mg one each pm - this should improve your sinus problems and your breathing, but you have to take it every day for a month Continue symbicort 160 mg Take 2 puffs first thing in am and then another 2 puffs about 12 hours later. Only use xopenex (rescue) when you really need it for breathing and stop the other forms of albuterol Only use zyrtec as needed for runny nose as needed    05/16/2011 f/u ov/Wert took singulair for two weeks and no real change the pattern of short of breath riding but no trouble with other forms of exercise but only used rescue 3 x total always while riding,  All three times needed rescue was w/in 3 hours of the symbicort,  Every time it happens is one third of the way cross country race x 2 min in with no  improvement in any of her symptoms x cough since started symcicort and gerd rxl.     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   Sleeping ok without nocturnal  or early am exac of resp c/o's or need for noct saba.   Pt denies any significant sore throat, dysphagia, itching, sneezing,    excess/ purulent  Nasal secretions,  fever, chills, sweats, unintended wt loss, pleuritic or exertional cp, hempoptysis, orthopnea pnd or leg swelling.    Also denies any obvious fluctuation of symptoms with weather or environmental changes or other aggravating or alleviating factors other than as outlined above.          Objective:   Physical  Exam  Anxious but quite pleasant amb  wf nad  Wt 03/21/2011  148 > 04/05/2011 >  148 04/20/2011  >  05/16/2011  149  HEENT: nl dentition, turbinates, and orophanx. Nl external ear canals without cough reflex   NECK :  without JVD/Nodes/TM/ nl carotid upstrokes bilaterally   LUNGS: no acc muscle use, clear to A and P bilaterally without cough on insp or exp maneuvers   CV:  RRR  no s3 or murmur or increase in P2, no edema   ABD:  soft and nontender with nl excursion in the supine position. No bruits or organomegaly, bowel sounds nl  MS:  warm without deformities, calf tenderness, cyanosis or clubbing    cxr 03/22/11  Significant bronchitic changes.  2. No focal pulmonary abnormality.     Assessment & Plan:

## 2011-05-17 ENCOUNTER — Encounter: Payer: Self-pay | Admitting: Internal Medicine

## 2011-05-17 NOTE — Assessment & Plan Note (Addendum)
-   Symbicort 160 trial 03/21/11 > changed to dulera 200 05/17/2011     - HFA 50% 05/16/2011  > 90% p coaching    - Methacholine challenge on Max gerd rx 04/19/11 > pos at 1.0  So added singulair trial 04/20/2011 > failed p two weeks  Symptoms are markedly disproportionate to objective findings and not clear this is a lung problem but pt does appear to have difficult airway management issues.   DDX of  difficult airways managment all start with A and  include Adherence, Ace Inhibitors, Acid Reflux, Active Sinus Disease, Alpha 1 Antitripsin deficiency, Anxiety masquerading as Airways dz,  ABPA,  allergy(esp in young), Aspiration (esp in elderly), Adverse effects of DPI,  Active smokers, plus two Bs  = Bronchiectasis and Beta blocker use..and one C= CHF  ? Acid reflux> no change rx  ? Anxiety > dx of exclusion > could be that she's just pushing herself beyond her capabilities just like we see in young basketball players who all want to perform like MJ  ?  Adherence is always the initial "prime suspect" and is a multilayered concern that requires a "trust but verify" approach in every patient - starting with knowing how to use medications, especially inhalers, correctly, keeping up with refills and understanding the fundamental difference between maintenance and prns vs those medications only taken for a very short course and then stopped and not refilled. The proper method of use, as well as anticipated side effects, of this metered-dose inhaler are discussed and demonstrated to the patient. Improved to 75% with coaching, no better on symbicort > try dulera 200 then regrouip - the asthma component that caused the cough the and pos MCT could probably be controlled with low dose qvar having failed to address all her symptoms with stronger meds and singulair   See instructions for specific recommendations which were reviewed directly with the patient who was given a copy with highlighter outlining the key  components.

## 2011-05-22 ENCOUNTER — Ambulatory Visit (INDEPENDENT_AMBULATORY_CARE_PROVIDER_SITE_OTHER): Payer: 59 | Admitting: Adult Health

## 2011-05-22 ENCOUNTER — Encounter: Payer: Self-pay | Admitting: Adult Health

## 2011-05-22 ENCOUNTER — Telehealth: Payer: Self-pay | Admitting: Internal Medicine

## 2011-05-22 DIAGNOSIS — J329 Chronic sinusitis, unspecified: Secondary | ICD-10-CM

## 2011-05-22 DIAGNOSIS — R0602 Shortness of breath: Secondary | ICD-10-CM

## 2011-05-22 DIAGNOSIS — Z9889 Other specified postprocedural states: Secondary | ICD-10-CM

## 2011-05-22 DIAGNOSIS — J45909 Unspecified asthma, uncomplicated: Secondary | ICD-10-CM

## 2011-05-22 DIAGNOSIS — R05 Cough: Secondary | ICD-10-CM

## 2011-05-22 NOTE — Telephone Encounter (Signed)
I spoke with pt and she c/o increase SOB x 2-3 days, cough. Pt states she had a rough weekend. Pt states Dr. Sherene Sires gave her a sample of xopenex but felt it didn't really help her. Pt is coming today to see TP at 4:30

## 2011-05-22 NOTE — Patient Instructions (Signed)
We are setting you up for a CT sinus .  Continue on Dulera 2 puffs Twice daily   Hold Flonase 2 days then restart May use Ayr Saline Gel As needed  For nasal irritation  Saline rinses As needed   follow up Dr. Sherene Sires  In 2-3 weeks and As needed   We are referring you to cardiology to evaluate your dyspnea.  Please contact office for sooner follow up if symptoms do not improve or worsen or seek emergency care

## 2011-05-22 NOTE — Progress Notes (Signed)
Subjective:    Patient ID: [place], female    DOB: November 14, 1955, 55 y.o.   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 2010 rec infusions of gammaglobulin not sure if they helped but if anything helped x maybe reduced sinus fungal infections but did not help breathing at all  then stopped them completely November 2011 due to systemic reactions > referred to pulmonary clinic by [facility] in Sept 2012    03/21/2011 Initial pulmonary office eval cc not happy with" breathing control" x 15  Years no better with advair,  alvesco albuterol. 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  Intermittent nasal congestion does not appear to correlate with breathing difficulties. No purulent sputum at present. rec Symbicort Take 2 puffs first thing in am and then another 2 puffs about 12 hours later.   Stop the alvesco and only use the albuterol in an emergency  Work on perfecting  inhaler technique:   .     Pepcid 20 mg after bfast and at bedtime for now automatically  GERD diet    04/05/2011 f/u ov/Wert cc no change in tolerance to riding in heat (could not catch her breath assoc with loss of voice) despite using symbicort 2 puff 12 hours then then 2 puffs before event. No problem with indoor aerobics rec Be sure to keep using symbicort  Take 2 puffs first thing in am and then another 2 puffs about 12 hours later.   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 Could not articulate what the other 50% of symptoms she experiences but may be related to tachcardia from so much saba use before activity. Has never tried singulair.  rec Try adding singulair 10 mg one each pm - this should improve your sinus problems and your breathing, but you have to take it every day for a month Continue symbicort 160 mg Take 2 puffs first thing in am and then another 2 puffs about 12 hours later. Only use xopenex (rescue) when you really need it for breathing and stop the other forms of albuterol Only use zyrtec as needed for runny nose as needed    05/16/2011 f/u ov/Wert took singulair for two weeks and no real change the pattern of short of breath riding but no trouble with other forms of exercise but only used rescue 3 x total always while riding,  All three times needed rescue was w/in 3 hours of the symbicort,  Every time it happens is one third of the way cross country race x 2 min in with no improvement  in any of her symptoms x cough since started symcicort and gerd rxl.    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 >>changed to [facility]   05/22/2011 Acute OV  Complains of increased SOB x 2 days worse with exertion and at night, nasal dryness, nosebleeds, wheezing, chest tightness. Was out in the cold air 2 days ago, she competes with competition horse racing, jumping fences and sprinting. Says symptoms predominately occur with racing. Within 30 secs of race starts to feel tightness in chest and dry cough. Used xopenex prior to race this weekend with no change in symptoms. Does have a lot of sinus congestion and drainage.  Had  some nosebleed 3 days ago.  No leg swelling, edema, abd pain or n/v/d.          Objective:   Physical Exam  Anxious but quite pleasant amb  wf nad  Wt 03/21/2011  148 > 04/05/2011 >  148 04/20/2011  >  05/16/2011  149>>149 05/22/11   HEENT: nl dentition, turbinates, and orophanx. Nl external ear canals without cough reflex   NECK :  without JVD/Nodes/TM/ nl carotid upstrokes bilaterally   LUNGS: no acc muscle use, clear to A and P bilaterally without cough on insp or exp maneuvers No wheezing noted.    CV:  RRR  no s3 or murmur or increase in P2, no edema   ABD:  soft and nontender with nl excursion in the supine position. No bruits or organomegaly, bowel sounds nl  MS:  warm without deformities, calf tenderness, cyanosis or clubbing         Assessment & Plan:

## 2011-05-23 ENCOUNTER — Ambulatory Visit (INDEPENDENT_AMBULATORY_CARE_PROVIDER_SITE_OTHER)
Admission: RE | Admit: 2011-05-23 | Discharge: 2011-05-23 | Disposition: A | Discharge status: Home / Self Care | Payer: 59 | Source: Ambulatory Visit | Attending: Adult Health | Admitting: Adult Health

## 2011-05-23 DIAGNOSIS — Z9889 Other specified postprocedural states: Secondary | ICD-10-CM

## 2011-05-23 DIAGNOSIS — R05 Cough: Secondary | ICD-10-CM

## 2011-05-23 DIAGNOSIS — J329 Chronic sinusitis, unspecified: Secondary | ICD-10-CM

## 2011-05-24 NOTE — Assessment & Plan Note (Signed)
Not optimally controlled asthma ? Etiology  Will check CT sinus to r/o possible sinus disease.  Will send to [facility] for eval of possible card component of the dyspnea. Has strong family hx of CAD   Plan:  We are setting you up for a CT sinus .  Continue on Dulera 2 puffs Twice daily   Hold Flonase 2 days then restart  May use Ayr Saline Gel As needed  For nasal irritation  Saline rinses As needed   follow up Dr. Sherene Sires  In 2-3 weeks and As needed   We are referring you to cardiology to evaluate your dyspnea.  Please contact office for sooner follow up if symptoms do not improve or worsen or seek emergency care

## 2011-06-01 ENCOUNTER — Other Ambulatory Visit: Payer: Self-pay | Admitting: Internal Medicine

## 2011-06-09 ENCOUNTER — Other Ambulatory Visit: Payer: Self-pay | Admitting: *Deleted

## 2011-06-09 ENCOUNTER — Telehealth: Payer: Self-pay | Admitting: Internal Medicine

## 2011-06-09 ENCOUNTER — Institutional Professional Consult (permissible substitution): Payer: 59 | Admitting: Cardiovascular Disease

## 2011-06-09 MED ORDER — MOMETASONE FURO-FORMOTEROL FUM 200-5 MCG/ACT IN AERO: 2.0000 | INHALATION_SPRAY | Freq: Two times a day (BID) | RESPIRATORY_TRACT | Status: DC

## 2011-06-09 MED ORDER — MOMETASONE FURO-FORMOTEROL FUM 200-5 MCG/ACT IN AERO: INHALATION_SPRAY | RESPIRATORY_TRACT | Status: DC

## 2011-06-09 NOTE — Telephone Encounter (Signed)
Refill sent. Pt aware.Andrej Spagnoli, CMA  

## 2012-06-07 ENCOUNTER — Other Ambulatory Visit: Payer: Self-pay | Admitting: Internal Medicine

## 2012-09-17 ENCOUNTER — Other Ambulatory Visit: Payer: Self-pay | Admitting: Neurology

## 2012-09-17 DIAGNOSIS — R109 Unspecified abdominal pain: Secondary | ICD-10-CM

## 2012-09-20 ENCOUNTER — Other Ambulatory Visit: Payer: 59

## 2012-09-23 ENCOUNTER — Other Ambulatory Visit: Payer: Self-pay | Admitting: Neurology

## 2012-09-23 ENCOUNTER — Ambulatory Visit
Admission: RE | Admit: 2012-09-23 | Discharge: 2012-09-23 | Disposition: A | Discharge status: Home / Self Care | Payer: 59 | Source: Ambulatory Visit | Attending: Neurology | Admitting: Neurology

## 2012-09-23 DIAGNOSIS — R109 Unspecified abdominal pain: Secondary | ICD-10-CM

## 2012-09-23 DIAGNOSIS — R209 Unspecified disturbances of skin sensation: Secondary | ICD-10-CM

## 2012-09-23 MED ORDER — IOHEXOL 300 MG/ML  SOLN
100.0000 mL | Freq: Once | INTRAMUSCULAR | Status: AC | PRN
  Administered 2012-09-23: 100 mL via INTRAVENOUS

## 2012-09-30 ENCOUNTER — Other Ambulatory Visit: Payer: Self-pay | Admitting: Neurology

## 2012-10-17 ENCOUNTER — Ambulatory Visit: Payer: Self-pay | Admitting: Neurology

## 2012-10-18 ENCOUNTER — Ambulatory Visit (HOSPITAL_COMMUNITY)
Admission: RE | Admit: 2012-10-18 | Discharge: 2012-10-18 | Disposition: A | Discharge status: Home / Self Care | Payer: 59 | Source: Ambulatory Visit | Attending: Chiropractic Medicine | Admitting: Chiropractic Medicine

## 2012-10-18 ENCOUNTER — Other Ambulatory Visit (HOSPITAL_COMMUNITY): Payer: Self-pay | Admitting: Chiropractic Medicine

## 2012-10-18 DIAGNOSIS — G8929 Other chronic pain: Secondary | ICD-10-CM | POA: Insufficient documentation

## 2012-10-18 DIAGNOSIS — M51379 Other intervertebral disc degeneration, lumbosacral region without mention of lumbar back pain or lower extremity pain: Secondary | ICD-10-CM | POA: Insufficient documentation

## 2012-10-18 DIAGNOSIS — M5137 Other intervertebral disc degeneration, lumbosacral region: Secondary | ICD-10-CM | POA: Insufficient documentation

## 2012-10-18 DIAGNOSIS — IMO0002 Reserved for concepts with insufficient information to code with codable children: Secondary | ICD-10-CM

## 2012-10-18 DIAGNOSIS — M25559 Pain in unspecified hip: Secondary | ICD-10-CM | POA: Insufficient documentation

## 2012-12-03 ENCOUNTER — Telehealth: Payer: Self-pay | Admitting: *Deleted

## 2012-12-03 NOTE — Telephone Encounter (Signed)
Chart reviewed.  Acyclovir 400mg  qd #90/0RF.

## 2012-12-03 NOTE — Telephone Encounter (Signed)
Last seen for Aex 11/28/11 next Aex 12/24/12   Ok to give #30?

## 2012-12-03 NOTE — Telephone Encounter (Signed)
Can you double check about this.  i think she is on Valacyclovir 500mg  qd.  OK to RF this #90/0RF [facility].  Please check with pharmacy about medication.  Let me know if not Valacyclovir (Valtrex).

## 2012-12-03 NOTE — Telephone Encounter (Signed)
Chart in your chair with fax from pharmacy attached

## 2012-12-24 ENCOUNTER — Encounter: Payer: Self-pay | Admitting: Obstetrics & Gynecology

## 2012-12-24 ENCOUNTER — Ambulatory Visit (INDEPENDENT_AMBULATORY_CARE_PROVIDER_SITE_OTHER): Payer: 59 | Admitting: Obstetrics & Gynecology

## 2012-12-24 VITALS — BP 102/70 | HR 64 | Resp 16 | Ht 61.75 in | Wt 144.4 lb

## 2012-12-24 DIAGNOSIS — Z01419 Encounter for gynecological examination (general) (routine) without abnormal findings: Secondary | ICD-10-CM

## 2012-12-24 DIAGNOSIS — Z0000 Encounter for general adult medical examination without abnormal findings: Secondary | ICD-10-CM

## 2012-12-24 DIAGNOSIS — L708 Other acne: Secondary | ICD-10-CM

## 2012-12-24 DIAGNOSIS — L709 Acne, unspecified: Secondary | ICD-10-CM

## 2012-12-24 LAB — POCT URINALYSIS DIPSTICK
Protein, UA: NEGATIVE
Urobilinogen, UA: NEGATIVE

## 2012-12-24 MED ORDER — ACYCLOVIR 400 MG PO TABS: 400.0000 mg | ORAL_TABLET | Freq: Every day | ORAL | Status: DC

## 2012-12-24 MED ORDER — ESTRADIOL 0.075 MG/24HR TD PTTW: 1.0000 | MEDICATED_PATCH | TRANSDERMAL | Status: DC

## 2012-12-24 MED ORDER — VALACYCLOVIR HCL 500 MG PO TABS: 500.0000 mg | ORAL_TABLET | Freq: Every day | ORAL | Status: DC

## 2012-12-24 NOTE — Progress Notes (Signed)
57 y.o. [id number] MarriedCaucasianF here for annual exam.  Needs new person for dermatology.  Having itchy place on back of neck/scalp.  Had bed bug outbreak.  Now with scarring on ankles.  Also had recent spider or tick bite.  Wants me to look at it.  Blood work with Mila Palmer.  All normal per patient.  She reports seeing neurologist for itchy place on scalp.  Referred by Dr. Sharyn Lull.  CT abd was done.  Reviewed today.  Negative.    No vaginal bleeding.  Still riding horses quite a bit.  Requests name of plastic surgeon for blepharoplasty and possible revision of scar under right breast that has been present since breast reduction was performed.  Patient's last menstrual period was 07/17/1986.          Sexually active: yes  The current method of family planning is status post hysterectomy.    Exercising: yes  horse back riding and running Smoker:  no  Health Maintenance: Pap:  11/15/10 WNL History of abnormal Pap:  yes MMG:  01/17/11 normal Colonoscopy:  2013 repeat in 5 years BMD:   2010 normal TDaP:  2010 Screening Labs: PCP, Hb today: PCP, Urine today: RBC-trace   reports that she has never smoked. She has never used smokeless tobacco. She reports that she drinks about 3.5 ounces of alcohol per week. She reports that she does not use illicit drugs.  Past Medical History  Diagnosis Date  . Asthma   . Sweet's disease   . Autoimmune disease   . Migraine   . Thyroid disease     hypothyroid  . STD (sexually transmitted disease)     HSV II  . Migraine   . Anxiety   . History of kidney stones   . IBS (irritable bowel syndrome)   . Cervical cancer     mom took DES    Past Surgical History  Procedure Laterality Date  . Colon surgery    . Nasal sinus surgery    . Vesicovaginal fistula closure w/ tah    . Abdominal hysterectomy  1988    ?retained ovaries for cx ca  . Hemorrhoid surgery    . Elbow surgery Right 1995    tendon repair  . Foot surgery Right   . Breast  lumpectomy Left 2009    benign  . Breast reduction surgery Bilateral 09/2010  . Excision vaginal cyst  10-25-2009    benign squamous cyst    Current Outpatient Prescriptions  Medication Sig Dispense Refill  . Acetaminophen (APAP) 325 MG tablet Take 650 mg by mouth as needed. Per PCP       . ALBUTEROL IN Inhale into the lungs as needed.      . Clobetasol Propionate 0.05 % shampoo Apply 1 application topically 2 (two) times daily as needed.      . Cyanocobalamin (VITAMIN B 12 PO) Take by mouth daily.      . diphenhydrAMINE (BENADRYL) 50 MG capsule Take 50 mg by mouth. Two times a day , uses 2-3 days prior to IVIG infusions per PCP       . docusate sodium (COLACE) 100 MG capsule Take 100 mg by mouth 2 (two) times daily as needed. Per PCP       . escitalopram (LEXAPRO) 10 MG tablet Take 10 mg by mouth daily.      Marland Kitchen estradiol (VIVELLE-DOT) 0.075 MG/24HR Place 1 patch onto the skin 2 (two) times a week.      Marland Kitchen  fluticasone (FLONASE) 50 MCG/ACT nasal spray Place 1 spray into the nose 2 (two) times daily.       Marland Kitchen levothyroxine (SYNTHROID, LEVOTHROID) 75 MCG tablet Take 75 mcg by mouth daily before breakfast.      . Linaclotide (LINZESS PO) Take 1 capsule by mouth daily.      . Mometasone Furo-Formoterol Fum (DULERA) 200-5 MCG/ACT AERO Inhale 2 puffs into the lungs 2 (two) times daily.  1 Inhaler  11  . Multiple Vitamins-Minerals (MULTIVITAMIN PO) Take by mouth daily.      Marland Kitchen NEXIUM 40 MG capsule TAKE 1 CAPSULE BY MOUTH DAILY.  30 capsule  0  . Omega-3 Fatty Acids (FISH OIL PO) Take 2 capsules by mouth at bedtime.      . SUMAtriptan (IMITREX) 25 MG tablet Take 25 mg by mouth as needed. Per PCP       . valACYclovir (VALTREX) 500 MG tablet Take 500 mg by mouth daily.        Marland Kitchen levalbuterol (XOPENEX HFA) 45 MCG/ACT inhaler Inhale 1 puff into the lungs every 4 (four) hours as needed for wheezing.  1 Inhaler  2   No current facility-administered medications for this visit.    Family History  Problem  Relation Age of Onset  . Allergies Mother   . Heart disease Mother   . Heart disease Father   . Clotting disorder Father     per pt had clot in is intestines  . Diabetes Father   . Thyroid disease Father   . Diabetes Sister   . Thyroid disease Sister     ROS:  Pertinent items are noted in HPI.  Otherwise, a comprehensive ROS was negative.  Exam:   BP 102/70  Pulse 64  Resp 16  Ht 5' 1.75" (1.568 m)  Wt 144 lb 6.4 oz (65.499 kg)  BMI 26.64 kg/m2  LMP 07/17/1986   Height: 5' 1.75" (156.8 cm)  Ht Readings from Last 3 Encounters:  12/24/12 5' 1.75" (1.568 m)  05/22/11 5\' 2"  (1.575 m)  05/16/11 5\' 2"  (1.575 m)    General appearance: alert, cooperative and appears stated age Head: Normocephalic, without obvious abnormality, atraumatic Neck: no adenopathy, supple, symmetrical, trachea midline and thyroid normal to inspection and palpation Lungs: clear to auscultation bilaterally Breasts: normal appearance, no masses or tenderness, s/p breast reduction Heart: regular rate and rhythm Abdomen: soft, non-tender; bowel sounds normal; no masses,  no organomegaly Extremities: extremities normal, atraumatic, no cyanosis or edema Skin: Skin color, texture, turgor normal. Superficial erythema around bite on right shin and left abdomen--appears to be topical reaction Lymph nodes: Cervical, supraclavicular, and axillary nodes normal. No abnormal inguinal nodes palpated Neurologic: Grossly normal   Pelvic: External genitalia:  no lesions              Urethra:  normal appearing urethra with no masses, tenderness or lesions              Bartholins and Skenes: normal                 Vagina: normal appearing vagina with normal color and discharge, no lesions, small 1-2cm cyst to right side of cuff              Cervix: absent              Pap taken: no Bimanual Exam:  Uterus:  uterus absent              Adnexa: normal adnexa  and no mass, fullness, tenderness               Rectovaginal:  Confirms               Anus:  normal sphincter tone, no lesions  A:  Well Woman with normal exam 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  Appt made for pt before she left for July 8 at [place]. 9:30am pap smear not indicated this year.  Neg 5/12 Rx for Acyclovir 400mg  qd #90/4 RF Vivelle dot 0.075 twice weekly #24/4 RF Derm referral will be made per pt request return annually or prn  An After Visit Summary was printed and given to the patient.

## 2012-12-24 NOTE — Patient Instructions (Signed)

## 2013-02-27 ENCOUNTER — Encounter: Payer: Self-pay | Admitting: Obstetrics & Gynecology

## 2013-03-12 ENCOUNTER — Encounter: Payer: Self-pay | Admitting: *Deleted

## 2013-03-12 ENCOUNTER — Ambulatory Visit (INDEPENDENT_AMBULATORY_CARE_PROVIDER_SITE_OTHER): Payer: 59 | Admitting: Internal Medicine

## 2013-03-12 ENCOUNTER — Encounter: Payer: Self-pay | Admitting: Internal Medicine

## 2013-03-12 VITALS — BP 133/88 | HR 71 | Temp 97.9°F | Wt 147.0 lb

## 2013-03-12 DIAGNOSIS — F3289 Other specified depressive episodes: Secondary | ICD-10-CM

## 2013-03-12 DIAGNOSIS — F329 Major depressive disorder, single episode, unspecified: Secondary | ICD-10-CM | POA: Insufficient documentation

## 2013-03-12 DIAGNOSIS — G4700 Insomnia, unspecified: Secondary | ICD-10-CM

## 2013-03-12 DIAGNOSIS — D801 Nonfamilial hypogammaglobulinemia: Secondary | ICD-10-CM

## 2013-03-12 LAB — CBC
Hemoglobin: 14.6 g/dL (ref 12.0–15.0)
Platelets: 290 10*3/uL (ref 150–400)
RBC: 4.38 MIL/uL (ref 3.87–5.11)
WBC: 6 10*3/uL (ref 4.0–10.5)

## 2013-03-12 LAB — COMPREHENSIVE METABOLIC PANEL
ALT: 18 U/L (ref 0–35)
Albumin: 4.4 g/dL (ref 3.5–5.2)
CO2: 30 mEq/L (ref 19–32)
Chloride: 102 mEq/L (ref 96–112)
Glucose, Bld: 87 mg/dL (ref 70–99)
Potassium: 4.2 mEq/L (ref 3.5–5.3)
Sodium: 139 mEq/L (ref 135–145)
Total Bilirubin: 0.6 mg/dL (ref 0.3–1.2)
Total Protein: 6.9 g/dL (ref 6.0–8.3)

## 2013-03-12 NOTE — Progress Notes (Signed)
Patient ID: Emily Calhoun, female   DOB: 1955-09-14, 57 y.o.   MRN: [id number]         [facility] for Infectious Disease  Patient Active Problem List   Diagnosis Date Noted  . Depression 03/12/2013    Priority: High  . Insomnia 03/12/2013    Priority: High  . Rash 02/21/2011    Priority: High  . 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 SINUSITIS, RECURRENT 10/26/2009    Priority: High  . ALLERGIC RHINITIS 08/18/2010  . CERVICAL CANCER, HX OF 11/02/2009  . CERVICAL CANCER 10/26/2009  . HYPOTHYROIDISM 10/26/2009  . Asthma 10/26/2009  . LOW BACK PAIN, CHRONIC 10/26/2009  . HEMORRHOIDS, HX OF 10/26/2009  . PERSONAL HISTORY OF URINARY CALCULI 10/26/2009    Patient's Medications  New Prescriptions   No medications on file  Previous Medications   ACETAMINOPHEN (APAP) 325 MG TABLET    Take 650 mg by mouth as needed. Per PCP    ACYCLOVIR (ZOVIRAX) 400 MG TABLET    Take 1 tablet (400 mg total) by mouth daily.   ALBUTEROL IN    Inhale into the lungs as needed.   ALPRAZOLAM (XANAX) 0.25 MG TABLET    Take 0.5 mg by mouth at bedtime as needed for sleep.   CLINDAMYCIN (CLEOCIN) 300 MG CAPSULE    Take 300 mg by mouth 3 (three) times daily.   CLOBETASOL PROPIONATE 0.05 % SHAMPOO    Apply 1 application topically 2 (two) times daily as needed.   CYANOCOBALAMIN (VITAMIN B 12 PO)    Take by mouth daily.   DIPHENHYDRAMINE (BENADRYL) 50 MG CAPSULE    Take 50 mg by mouth. Two times a day , uses 2-3 days prior to IVIG infusions per PCP    ESCITALOPRAM (LEXAPRO) 10 MG TABLET    Take 10 mg by mouth daily.   ESTRADIOL (VIVELLE-DOT) 0.075 MG/24HR    Place 1 patch onto the skin 2 (two) times a week.   FLUTICASONE (FLONASE) 50 MCG/ACT NASAL SPRAY    Place 1 spray into the nose 2 (two) times daily.    LEVOTHYROXINE (SYNTHROID, LEVOTHROID) 75 MCG TABLET    Take 75 mcg by mouth daily before breakfast.   LINACLOTIDE (LINZESS PO)    Take 1 capsule by mouth daily.   MOMETASONE  FURO-FORMOTEROL FUM (DULERA) 200-5 MCG/ACT AERO    Inhale 2 puffs into the lungs 2 (two) times daily.   MOMETASONE-FORMOTEROL (DULERA) 100-5 MCG/ACT AERO    Inhale 2 puffs into the lungs.   MULTIPLE VITAMINS-MINERALS (MULTIVITAMIN PO)    Take by mouth daily.   NEXIUM 40 MG CAPSULE    TAKE 1 CAPSULE BY MOUTH DAILY.   SUMATRIPTAN (IMITREX) 25 MG TABLET    Take 25 mg by mouth as needed. Per PCP   Modified Medications   No medications on file  Discontinued Medications   DOCUSATE SODIUM (COLACE) 100 MG CAPSULE    Take 100 mg by mouth 2 (two) times daily as needed. Per PCP    LEVALBUTEROL (XOPENEX HFA) 45 MCG/ACT INHALER    Inhale 1 puff into the lungs every 4 (four) hours as needed for wheezing.   OMEGA-3 FATTY ACIDS (FISH OIL PO)    Take 2 capsules by mouth at bedtime.    Subjective: Emily Calhoun is in for her first visit in 2 years. She has a history of recurrent sinus infections and asthma. 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 She  took bimonthly IVIG infusions for years before she moved here from [place] but decided to stop them in early 2012 because of side effects and uncertainty as to whether or not they were helping prevent infections. She feels like she's been doing worse since May of this year. She went on a trip to [place] and upon returning she developed a blistering rash on both ankles. It seemed to get better on its own then recurred and now she is left with excoriated lesions. Last month she developed increased sinus congestion and sore throat. She noted some black dots in the nasal discharge and was worried about fungal infection. She's had subjective fevers, chills and sweats. She says that when she gets this way she becomes very depressed and has no energy and feels like she could sleep all day. Over the past few months she's had a notable change in her sleep pattern. She has difficulty falling asleep and staying asleep. Several months ago she started  taking Xanax 0.5 mg at bedtime but still has difficulty sleeping. She will also occasionally take Benadryl to help her sleep. He is now frequently napping during the day and finds it difficult to complete her normal activities. She tries to stay active with jogging and horseback riding but finds that she's not able to do as much as she normally would. She will note unusual dyspnea on exertion with his exercises. She's had no change in her appetite and states she eats well. She has not had any change in her weight.  She believes it's been at least 6 months and she saw her primary care physician, Dr. Paulino Rily. She's been seen by Dr. Ritta Slot for a body composition analysis. His also been seeing her dermatologist, Dr. Sharyn Lull. She was referred to see Dr. Terrace Arabia, a neurologist because of pain and a "whelp" on her left posterior scalp. Dr. Terrace Arabia ordered a CT scan because of a history that was obtained of some lower abdominal and right hip pain. There are no acute abnormalities and apparently there was no etiology for the scalp pain found. She's also been seen more recently by Dr. Osborn Coho who performed endoscopic evaluation of her sinuses. Cultures were obtained that he'll but methicillin sensitive staph aureus and multiple skin flora. The staph aureus is sensitive to clindamycin which he has been taking for the past 3 days. She has also been using her Nettie pot and intranasal Bactroban. She think she may feel a little bit better today.  Review of Systems: Constitutional: positive for chills, fatigue, fevers and sweats, negative for anorexia and weight loss Eyes: negative Ears, nose, mouth, throat, and face: positive for hearing loss and nasal congestion, negative for ear drainage, earaches, sore mouth and sore throat Respiratory: positive for asthma, cough, dyspnea on exertion and sputum, negative for hemoptysis and pleurisy/chest pain Cardiovascular: positive for dyspnea and palpitations, negative for  chest pressure/discomfort, irregular heart beat, orthopnea and paroxysmal nocturnal dyspnea Gastrointestinal: negative Genitourinary:negative Integument/breast: positive for skin lesion(s) and as noted under subjective, negative for dryness, pruritus and skin color change Musculoskeletal:positive for right hip pain that she attributes to riding her horse, negative for back pain, myalgias, neck pain and stiff joints Behavioral/Psych: positive for depression and sleep disturbance, negative for anxiety and loss of interest in favorite activities  Past Medical History  Diagnosis Date  . Asthma   . Sweet's disease   . Autoimmune disease   . Migraine   . Thyroid disease     hypothyroid  .  STD (sexually transmitted disease)     HSV II  . Migraine   . Anxiety   . History of kidney stones   . IBS (irritable bowel syndrome)   . Cervical cancer     mom took DES  . Hypogammaglobulinemia     history of    History  Substance Use Topics  . Smoking status: Never Smoker   . Smokeless tobacco: Never Used  . Alcohol Use: 3.5 oz/week    7 drink(s) per week    Family History  Problem Relation Age of Onset  . Allergies Mother   . Heart disease Mother   . Heart disease Father   . Clotting disorder Father     per pt had clot in is intestines  . Diabetes Father   . Thyroid disease Father   . Diabetes Sister   . Thyroid disease Sister     Allergies  Allergen Reactions  . Azithromycin   . Cephalexin   . Cephalosporins   . Clarithromycin   . Gentamicin Sulfate     REACTION: all mycins  . Ketoconazole   . Levofloxacin   . Macrobid [Nitrofurantoin]   . Metronidazole   . Morphine Sulfate   . Penicillins   . Singulair [Montelukast Sodium]     Nervousness and depression per pt  . Sulfonamide Derivatives   . Tobramycin-Dexamethasone     Objective: Temp: 97.9 F (36.6 C) (08/27 1137) Temp src: Oral (08/27 1137) BP: 133/88 mmHg (08/27 1137) Pulse Rate: 71 (08/27 1137)  General:  She appears both worried and frustrated. At time she is tearful. Skin: She has some excoriated lesions around her ankles. She is a pale patch of dry skin the web space between her thumb and index finger on right hand. Oral: No oropharyngeal lesions Ears: Normal canals and tympanic membranes Nose: Both nares open but edematous and erythematous mucosa left greater than right Lymph nodes: No palpable adenopathy Lungs: Clear bilaterally Cor: Regular S1 and S2 no murmurs Abdomen: Soft and nontender with no palpable masses Joints and extremities: Normal Neuro: Normal speech and conversation Mood and affect: Appears mildly depressed  Lab Results No recent lab work available   Assessment: I doubt that there is one simple answer to all of the problems Verdene is currently facing. She has long history of sinus congestion and respiratory problems. This may possibly be due to recurrent infections related to hypogammaglobulinemia but there is probably also a component of seasonal allergies and possibly her GERD. I will obtain quantitative immunoglobulins today and have her continue taking the clindamycin for now.  She is also suffering from worsening depression. She states that she has considered seeing a psychiatrist/counselor. This is probably contributing to her fatigue and difficulty sleeping and vice versa. I certainly also possible that her use of Benadryl and Xanax is affecting her mood and stamina. I will try to arrange a psychiatric evaluation.  She has a variety of skin lesions that are chronic and bothersome to her. On exam, the lesions are very nonspecific. She is requesting help obtaining a second opinion dermatology evaluation.  Plan: 1. Check CBC, complete metabolic panel, and quantitative immunoglobulin panel today 2. Continue clindamycin for now 3. Referral for psychiatric evaluation 4. Referral for dermatologic evaluation   Cliffton Asters, MD [facility] for Infectious  Disease [facility] [phone] pager   [phone] cell 03/12/2013, 1:22 PM

## 2013-03-13 ENCOUNTER — Telehealth: Payer: Self-pay | Admitting: *Deleted

## 2013-03-13 LAB — OTHER SOLSTAS TEST
IgA: 68 mg/dL — ABNORMAL LOW (ref 69–380)
IgG (Immunoglobin G), Serum: 996 mg/dL (ref 690–1700)
IgM, Serum: 63 mg/dL (ref 52–322)

## 2013-03-13 NOTE — Telephone Encounter (Signed)
Per Dr Orvan Falconer made the patient a referral to [facility] for 03/14/13 at 240 pm with Dr Michell Heinrich. Also called  Dr Donell Beers office to make a referral and was told to give the patient the number and have her call as they do not take referrals and if they need any notes they will call our office. Advised the patient to call [phone] the patient request line and schedule asap. Called the patient and advised her of the appt with Dorcas Mcmurray and the number for counseling and advised her if any questions please call the office. She was very happy to get an appt so quickly and advised to tell Dr Orvan Falconer thank you.

## 2013-03-19 ENCOUNTER — Telehealth: Payer: Self-pay | Admitting: *Deleted

## 2013-03-19 NOTE — Telephone Encounter (Signed)
Patient called for her lab results, in particular the "gammaglobulinemia" test. Please advise Wendall Mola

## 2013-04-07 ENCOUNTER — Telehealth: Payer: Self-pay

## 2013-04-07 NOTE — Telephone Encounter (Signed)
See note

## 2013-04-07 NOTE — Telephone Encounter (Signed)
Patient is calling to inform physician of recurrent sinus infection. She is on third course of antibiotics given by Dr Annalee Genta.  Patient wanted Infectious Disease specialist to know what was happening.   Laurell Josephs, RN

## 2013-04-07 NOTE — Telephone Encounter (Deleted)
Patient is calling to inform physician of her recurrent

## 2013-04-08 ENCOUNTER — Telehealth: Payer: Self-pay | Admitting: Internal Medicine

## 2013-04-08 ENCOUNTER — Telehealth: Payer: Self-pay | Admitting: Licensed Clinical Social Worker

## 2013-04-08 NOTE — Telephone Encounter (Signed)
Patient called today concerned about now being on a third antibiotic and still not feeling well. She would like Dr.Campbell to give her some insight on what she needs to do from this point. Also see previous not from yesterday.

## 2013-04-08 NOTE — Telephone Encounter (Signed)
I spoke to Emily Calhoun today. Last week she had a recurrence of sore throat, increased cough productive of yellow sputum and purulent sinus drainage. She is not having any fevers. She spoke with Dr. Thurmon Fair nurse and a refill on her clindamycin prescription was called in. She started out on September 19 and is feeling better but is frustrated by the recurrent symptoms. She's not had any recent cultures.  I told her that I suspect that her acute on chronic symptoms are related to underlying respiratory tract disease and that it may be that her symptoms are not always do to an acute infection that will respond to antibiotics. I suggested that she followup with Dr. Francella Solian, her pulmonologist and return to see me in one week. If she is having continued cough and purulent secretions I will obtain sputum cultures to help guide any future antibiotic therapy.  Her recent immunoglobulin levels did not show any impressive deficiencies and I do not think that restarting intravenous immunoglobulin is likely to make much difference at this time.

## 2013-04-15 ENCOUNTER — Ambulatory Visit (INDEPENDENT_AMBULATORY_CARE_PROVIDER_SITE_OTHER): Payer: 59 | Admitting: Internal Medicine

## 2013-04-15 ENCOUNTER — Encounter: Payer: Self-pay | Admitting: Internal Medicine

## 2013-04-15 VITALS — BP 119/78 | HR 84 | Temp 98.0°F | Ht 62.0 in | Wt 148.0 lb

## 2013-04-15 DIAGNOSIS — Z23 Encounter for immunization: Secondary | ICD-10-CM

## 2013-04-15 DIAGNOSIS — J329 Chronic sinusitis, unspecified: Secondary | ICD-10-CM

## 2013-04-15 NOTE — Progress Notes (Signed)
Patient ID: Emily Calhoun, female   DOB: 22-Feb-1956, 57 y.o.   MRN: [id number]         [facility]ious Disease  Patient Active Problem List   Diagnosis Date Noted  . Depression 03/12/2013    Priority: High  . Insomnia 03/12/2013    Priority: High  . Rash 02/21/2011    Priority: High  . 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 SINUSITIS, RECURRENT 10/26/2009    Priority: High  . ALLERGIC RHINITIS 08/18/2010  . CERVICAL CANCER, HX OF 11/02/2009  . CERVICAL CANCER 10/26/2009  . HYPOTHYROIDISM 10/26/2009  . Asthma 10/26/2009  . LOW BACK PAIN, CHRONIC 10/26/2009  . HEMORRHOIDS, HX OF 10/26/2009  . PERSONAL HISTORY OF URINARY CALCULI 10/26/2009    Patient's Medications  New Prescriptions   No medications on file  Previous Medications   ACETAMINOPHEN (APAP) 325 MG TABLET    Take 650 mg by mouth as needed. Per PCP    ACYCLOVIR (ZOVIRAX) 400 MG TABLET    Take 1 tablet (400 mg total) by mouth daily.   ALBUTEROL IN    Inhale into the lungs as needed.   ALPRAZOLAM (XANAX) 0.25 MG TABLET    Take 0.5 mg by mouth at bedtime as needed for sleep.   CLINDAMYCIN (CLEOCIN) 300 MG CAPSULE    Take 300 mg by mouth 3 (three) times daily.   CLOBETASOL PROPIONATE 0.05 % SHAMPOO    Apply 1 application topically 2 (two) times daily as needed.   CYANOCOBALAMIN (VITAMIN B 12 PO)    Take by mouth daily.   DIPHENHYDRAMINE (BENADRYL) 50 MG CAPSULE    Take 50 mg by mouth. Two times a day , uses 2-3 days prior to IVIG infusions per PCP    ESCITALOPRAM (LEXAPRO) 10 MG TABLET    Take 20 mg by mouth daily.    ESTRADIOL (VIVELLE-DOT) 0.075 MG/24HR    Place 1 patch onto the skin 2 (two) times a week.   FLUTICASONE (FLONASE) 50 MCG/ACT NASAL SPRAY    Place 1 spray into the nose 2 (two) times daily.    LEVOTHYROXINE (SYNTHROID, LEVOTHROID) 75 MCG TABLET    Take 75 mcg by mouth daily before breakfast.   LINACLOTIDE (LINZESS PO)    Take 1 capsule by mouth daily.   MOMETASONE  FURO-FORMOTEROL FUM (DULERA) 200-5 MCG/ACT AERO    Inhale 2 puffs into the lungs 2 (two) times daily.   MOMETASONE-FORMOTEROL (DULERA) 100-5 MCG/ACT AERO    Inhale 2 puffs into the lungs.   MULTIPLE VITAMINS-MINERALS (MULTIVITAMIN PO)    Take by mouth daily.   NEXIUM 40 MG CAPSULE    TAKE 1 CAPSULE BY MOUTH DAILY.   SUMATRIPTAN (IMITREX) 25 MG TABLET    Take 25 mg by mouth as needed. Per PCP   Modified Medications   No medications on file  Discontinued Medications   No medications on file    Subjective: Emily Calhoun is in for a followup visit with her husband, Emily Calhoun. She's been back on clindamycin since September 19 after having another flare of her sinus congestion and cough productive of yellow-green sputum. She is feeling better with decreased congestion. She has not had any fever, chills or sweats. She is back to her baseline dyspnea on exertion. She notes wheezing daily but says this is unchanged.  She saw Emily Calhoun recently for a psychiatric evaluation. He recommended increasing her Lexapro. My records indicated that she was taking 10 mg daily and his  history also obtained that same dose. He recommended increasing the Lexapro to 20 mg daily but she states that after she got to the new prescription refills she realized that she had been taking 20 mg previously and so was taking 40 mg for a few days before she realized it. She then stopped and went back to her baseline 20 mg daily. She previously told me that she was taking Xanax about once daily but now says she rarely takes it. She reiterates that she is much more depressed since having the respiratory problems over the past 6 weeks. She is very frustrated and wants to know what she should do with her medications. She does not have followup visit with Emily Calhoun until November.  She states that she is using her rescue inhaler whenever she rides her horse or has more exercise. She states that she's not sure if her Elwin Sleight is helping she has not  seen her pulmonologist, Dr. Francella Calhoun, and about 2 years. She asked me if she thought it would be helpful for her to get another opinion at [facility].  Review of Systems: Pertinent items are noted in HPI.  Past Medical History  Diagnosis Date  . Asthma   . Sweet's disease   . Autoimmune disease   . Migraine   . Thyroid disease     hypothyroid  . STD (sexually transmitted disease)     HSV II  . Migraine   . Anxiety   . History of kidney stones   . IBS (irritable bowel syndrome)   . Cervical cancer     mom took DES  . Hypogammaglobulinemia     history of    History  Substance Use Topics  . Smoking status: Never Smoker   . Smokeless tobacco: Never Used  . Alcohol Use: 3.5 oz/week    7 drink(s) per week    Family History  Problem Relation Age of Onset  . Allergies Mother   . Heart disease Mother   . Heart disease Father   . Clotting disorder Father     per pt had clot in is intestines  . Diabetes Father   . Thyroid disease Father   . Diabetes Sister   . Thyroid disease Sister     Allergies  Allergen Reactions  . Azithromycin   . Cephalexin   . Cephalosporins   . Clarithromycin   . Gentamicin Sulfate     REACTION: all mycins  . Ketoconazole   . Levofloxacin   . Macrobid [Nitrofurantoin]   . Metronidazole   . Morphine Sulfate   . Penicillins   . Singulair [Montelukast Sodium]     Nervousness and depression per pt  . Sulfonamide Derivatives   . Tobramycin-Dexamethasone     Objective: Temp: 98 F (36.7 C) (09/30 1149) Temp src: Oral (09/30 1149) BP: 119/78 mmHg (09/30 1149) Pulse Rate: 84 (09/30 1149)  General: She is in no distress Skin: No rash Nasal mucosa: Slightly pink in left nostril without drainage Or pharyngeal: No lesions Lungs: Clear without any crackles or wheezing Cor: Regular S1 and S2 no murmurs Mood and affect: Frustrated   Assessment: Emily Calhoun has had some recent flares of her chronic bronchitis and sinusitis.  She has improved with a third course of oral clindamycin. I will have her complete therapy in 3 more days then stop. I think she also has some component of chronic respiratory illness and asthma and I suggested that she schedule a visit with Dr.  Wert in the near future. I obtain quantitative immunoglobulin levels one month ago and all were normal with the exception of her IgA level which was just 1 point below normal at 68. I'm not convinced that restarting intravenous immunoglobulin we'll offer significant benefit.  She is clearly more depressed and anxious. I am not sure how the confusion over her Lexapro dose occurred but I suggested that she followup with Emily Calhoun to get further input about her medical management.  Plan: 1. Complete 3 more days of clindamycin and stop and observe off antibiotics 2. Influenza vaccination today 3. Schedule followup visit with Emily Calhoun 4. Followup with Emily Calhoun 5. Follow up here in 4-6 weeks   Cliffton Asters, MD [facility] [phone] pager   [phone] cell 04/15/2013, 12:25 PM

## 2013-04-16 ENCOUNTER — Encounter: Payer: Self-pay | Admitting: Internal Medicine

## 2013-04-22 ENCOUNTER — Encounter: Payer: Self-pay | Admitting: Internal Medicine

## 2013-04-22 ENCOUNTER — Ambulatory Visit (INDEPENDENT_AMBULATORY_CARE_PROVIDER_SITE_OTHER): Payer: 59 | Admitting: Internal Medicine

## 2013-04-22 VITALS — BP 114/70 | HR 94 | Temp 97.8°F | Ht 61.5 in | Wt 152.0 lb

## 2013-04-22 DIAGNOSIS — J45909 Unspecified asthma, uncomplicated: Secondary | ICD-10-CM

## 2013-04-22 MED ORDER — ALBUTEROL SULFATE HFA 108 (90 BASE) MCG/ACT IN AERS: 2.0000 | INHALATION_SPRAY | Freq: Four times a day (QID) | RESPIRATORY_TRACT | Status: DC | PRN

## 2013-04-22 NOTE — Progress Notes (Signed)
Subjective:    Patient ID: Emily Calhoun, female    DOB: 1955-10-01   MRN: [id number]  Brief patient profile:  56 yowf  Daughter of GP never smoked but grew up in house of smokers with lots of ear infections and sore throat assoc with breathing difficulties going back to childhood and played competitive tennis in 20's with def poor aerobic tolerance  and allergy tests neg as adult multiple times "always neg"  with low IgA and  IgM borderline by w/u in  Immunology at [place] rec infusions of gammaglobulin not sure if they helped but if anything helped x maybe reduced sinus fungal infections but did not help breathing at all  then stopped them completely November 2011 due to systemic reactions > referred to pulmonary clinic by [facility] in Sept 2012 with Pos methacholine challenge 04/2011 though only repoduced symptoms about 50% level.  History of Present Illness  03/21/2011 Initial pulmonary office eval cc not happy with" breathing control" x 15  Years no better with advair,  alvesco albuterol. 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  Intermittent nasal congestion does not appear to correlate with breathing difficulties. No purulent sputum at present. rec Symbicort Take 2 puffs first thing in am and then another 2 puffs about 12 hours later.  Stop the alvesco and only use the albuterol in an emergency Work on perfecting  inhaler technique:   .    Pepcid 20 mg after bfast and at bedtime for now automatically GERD diet    04/05/2011 f/u ov/Kenzli Barritt cc no change in tolerance to riding in heat (could not catch her breath assoc with loss of voice) despite using symbicort 2 puff 12 hours then then 2 puffs before event. No problem with indoor aerobics rec Be sure to keep using symbicort  Take 2 puffs first thing in am and then another 2 puffs about  12 hours later.  Only use your albuterol as a rescue medication to be used if you can't catch your breath Start nexium 40 mg Take 30-60 min before first meal of the day  Continue pepcid 20 mg one at bedtime    04/20/2011 f/u ov/Nekoda Chock cc better overall with  50% reproduction of symptoms with Methacholine challenge test  and better p albuterol - less than twice weekly. Could not articulate what the other 50% of symptoms she experiences but may be related to tachcardia from so much saba use before activity. Has never tried singulair.  rec Try adding singulair 10 mg one each pm - this should improve your sinus problems and your breathing, but you have to take it every day for a month Continue symbicort 160 mg Take 2 puffs first thing in am and then another 2 puffs about 12 hours later. Only use xopenex (rescue) when you really need it for breathing and stop the other forms of albuterol Only use zyrtec as needed for runny nose as needed    05/16/2011 f/u ov/Maryse Brierley took singulair for two weeks and no real change the pattern of short of breath riding but no trouble with other forms of exercise but only used rescue 3 x total always while riding,  All three times needed rescue was w/in 3 hours of the symbicort,  Every time it happens is one third of the way cross country race x  2 min in with no improvement in any of her symptoms x cough since started symcicort and gerd rxl.   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 >>changed to [employer]   05/22/2011 Acute OV  Complains of increased SOB x 2 days worse with exertion and at night, nasal dryness, nosebleeds, wheezing, chest tightness. Was out in the cold air 2 days ago, she competes with competition horse racing, jumping fences and sprinting. Says symptoms predominately occur with racing. Within 30 secs of race starts to feel tightness in chest and dry cough. Used xopenex prior  to race this weekend with no change in symptoms. Does have a lot of sinus congestion and drainage.  Had some nosebleed 3 days ago.  rec We are setting you up for a CT sinus .  Continue on Dulera 2 puffs Twice daily   Hold Flonase 2 days then restart May use [place] Saline Gel As needed  For nasal irritation  Saline rinses As needed       04/22/2013 f/u ov/Tryphena Perkovich re: asthma poor control on dulera and "prn" alvesco Chief Complaint  Patient presents with  . Acute Visit    Pt c/o increased SOB for the past 2 months. She states that she only gets SOB with exertion.    after sev hours supine cough dry most nights  Doe x  Riding horseback daily x 2 months x worse than usual attributed to heat No relief prn alvesco  - has not tried rescue saba or rechallenge p ? eia Cardiac w/u by Mendel Ryder "one half my heart does not work"  = LBBB with nl echo per notes reviewed   No obvious pattern to  day to day or daytime variabilty or assoc chronic daytime or ex cough or cp or chest tightness, subjective wheeze overt sinus or hb symptoms. No unusual exp hx or h/o childhood pna/ asthma or knowledge of premature birth.  Sleeping ok without early am exacerbation  of respiratory  c/o's or need for noct saba. Also denies any obvious fluctuation of symptoms with weather or environmental changes or other aggravating or alleviating factors except as outlined above   Current Medications, Allergies, Complete Past Medical History, Past Surgical History, Family History, and Social History were reviewed in [employer] record.  ROS  The following are not active complaints unless bolded sore throat, dysphagia, dental problems, itching, sneezing,  nasal congestion or excess/ purulent secretions, ear ache,   fever, chills, sweats, unintended wt loss, pleuritic or exertional cp, hemoptysis,  orthopnea pnd or leg swelling, presyncope, palpitations, heartburn, abdominal pain, anorexia, nausea, vomiting, diarrhea   or change in bowel or urinary habits, change in stools or urine, dysuria,hematuria,  rash, arthralgias, visual complaints, headache, numbness weakness or ataxia or problems with walking or coordination,  change in mood/affect or memory.               Objective:   Physical Exam  Anxious but quite pleasant amb  wf nad  Wt 03/21/2011  148 > 04/05/2011 >  148 04/20/2011  >  05/16/2011  149>>149 05/22/11 > 04/22/2013  152   HEENT: nl dentition, turbinates, and orophanx. Nl external ear canals without cough reflex   NECK :  without JVD/Nodes/TM/ nl carotid upstrokes bilaterally   LUNGS: no acc muscle use, clear to A and P bilaterally without cough on insp or exp maneuvers No wheezing  noted.    CV:  RRR  no s3 or murmur or increase in P2, no edema   ABD:  soft and nontender with nl excursion in the supine position. No bruits or organomegaly, bowel sounds nl  MS:  warm without deformities, calf tenderness, cyanosis or clubbing         Assessment & Plan:

## 2013-04-22 NOTE — Patient Instructions (Addendum)
Continue dulera 200 Take 2 puffs first thing in am and then another 2 puffs about 12 hours later.    Only use your albuterol (proire) as a rescue medication to be used if you can't catch your breath by resting or doing a relaxed purse lip breathing pattern.   - ok to use proaire when short of breath horseback riding, take 2 puffs and wait  5 min then try to resume same activity as caused you to be short breath  - if not happy with the results, you need a cpst having done your am dulera but not the albuterol on that specific day and we'll do the test with spirometry before and after to see if you have any exercise induced asthma that is not being eliminated with dulera.  Pepcid 20 mg one at bedtime for at least a month to see if your noct cough improves  GERD (REFLUX)  is an extremely common cause of respiratory symptoms, many times with no significant heartburn at all.    It can be treated with medication, but also with lifestyle changes including avoidance of late meals, excessive alcohol, smoking cessation, and avoid fatty foods, chocolate, peppermint, colas, red wine, and acidic juices such as orange juice.  NO MINT OR MENTHOL PRODUCTS SO NO COUGH DROPS  USE SUGARLESS CANDY INSTEAD (jolley ranchers or Stover's)  NO OIL BASED VITAMINS - use powdered substitutes.

## 2013-04-24 NOTE — Assessment & Plan Note (Addendum)
-   Symbicort 160 trial 03/21/11 > changed to dulera 200 05/17/2011     - HFA 50% 05/16/2011  > 90% 04/22/2013     - Methacholine challenge on Max gerd rx 04/19/11 > pos at 1.0  So added singulair trial 04/20/2011 > failed p two weeks     -CT sinus 05/23/11 -neg (Minimal mucosal thickening along the anterior aspect of the left maxillary sinus)  Very strongly doubt that the symptoms she has with intense ex "all her life" are ex induced asthma though we have never excluded this possibility by cpst with spirometry before and after while on dulera, which is the next logical step in the w/u and will probably point to a conditioning issue which I reviewed in detail with her today to  Help her understand how this works and 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  In the meantime she can certainly try saba and rechallenge herself 5 min p saba for any activity that she knows provokes sob  The noct symptoms of dry cough are much more likely GERD than asthma related based on occurying freq w/in an hour of hs and for this I rec pepcid at hs  Extensive review also of Dr  Rexene Edison Smith's notes re cardiac status = benign LBBB

## 2013-04-30 ENCOUNTER — Telehealth: Payer: Self-pay

## 2013-04-30 DIAGNOSIS — J329 Chronic sinusitis, unspecified: Secondary | ICD-10-CM

## 2013-04-30 MED ORDER — DOXYCYCLINE HYCLATE 50 MG PO CAPS: 50.0000 mg | ORAL_CAPSULE | Freq: Two times a day (BID) | ORAL | Status: DC

## 2013-04-30 NOTE — Telephone Encounter (Addendum)
Patient is having discolored sinus drainage after discontinuing Antibiotic for one week.    Please advise.  She will also call Dr Thurmon Fair office to report symptoms.   Laurell Josephs, RN

## 2013-04-30 NOTE — Telephone Encounter (Signed)
I will give her a trial of oral doxycycline. She has an extensive list of antibiotic allergies and intolerances that severely limits therapeutic options.

## 2013-05-16 ENCOUNTER — Telehealth: Payer: Self-pay | Admitting: *Deleted

## 2013-05-16 NOTE — Telephone Encounter (Signed)
Patient called and advised she is having another sinus infection and she wants to have an antibiotic called in. Advised her that because she has an extensive allergy list and just had a sinus infection 04/30/13 for which we called in meds she will need to be seen. We were disconnected and I advised Tamika of the call and that if she calls back to just schedule her with the first available to be checked out.

## 2013-05-19 ENCOUNTER — Encounter: Payer: Self-pay | Admitting: Internal Medicine

## 2013-05-19 ENCOUNTER — Ambulatory Visit (INDEPENDENT_AMBULATORY_CARE_PROVIDER_SITE_OTHER): Payer: 59 | Admitting: Internal Medicine

## 2013-05-19 VITALS — BP 124/86 | HR 84 | Temp 97.8°F | Ht 62.0 in | Wt 152.0 lb

## 2013-05-19 DIAGNOSIS — J329 Chronic sinusitis, unspecified: Secondary | ICD-10-CM

## 2013-05-19 NOTE — Progress Notes (Signed)
Patient ID: Emily Calhoun, female   DOB: February 21, 1956, 57 y.o.   MRN: [id number]         [facility]ious Disease  Patient Active Problem List   Diagnosis Date Noted  . Depression 03/12/2013    Priority: High  . Insomnia 03/12/2013    Priority: High  . Rash 02/21/2011    Priority: High  . 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 SINUSITIS, RECURRENT 10/26/2009    Priority: High  . ALLERGIC RHINITIS 08/18/2010  . CERVICAL CANCER, HX OF 11/02/2009  . CERVICAL CANCER 10/26/2009  . HYPOTHYROIDISM 10/26/2009  . Asthma 10/26/2009  . LOW BACK PAIN, CHRONIC 10/26/2009  . HEMORRHOIDS, HX OF 10/26/2009  . PERSONAL HISTORY OF URINARY CALCULI 10/26/2009    Patient's Medications  New Prescriptions   No medications on file  Previous Medications   ACETAMINOPHEN (APAP) 325 MG TABLET    Take 650 mg by mouth as needed. Per PCP    ACYCLOVIR (ZOVIRAX) 400 MG TABLET    Take 1 tablet (400 mg total) by mouth daily.   ALBUTEROL (PROAIR HFA) 108 (90 BASE) MCG/ACT INHALER    Inhale 2 puffs into the lungs every 6 (six) hours as needed for wheezing or shortness of breath.   ALPRAZOLAM (XANAX) 0.25 MG TABLET    Take 0.5 mg by mouth at bedtime as needed for sleep.   CICLESONIDE (ALVESCO) 80 MCG/ACT INHALER    Inhale 1 puff into the lungs as needed.   CYANOCOBALAMIN (VITAMIN B 12 PO)    Take by mouth daily.   DIPHENHYDRAMINE (BENADRYL) 50 MG CAPSULE    Take 50 mg by mouth. Two times a day , uses 2-3 days prior to IVIG infusions per PCP    ESTRADIOL (VIVELLE-DOT) 0.075 MG/24HR    Place 1 patch onto the skin 2 (two) times a week.   FLUTICASONE (FLONASE) 50 MCG/ACT NASAL SPRAY    Place 1 spray into the nose 2 (two) times daily.    LEVOTHYROXINE (SYNTHROID, LEVOTHROID) 75 MCG TABLET    Take 75 mcg by mouth daily before breakfast.   LINACLOTIDE (LINZESS PO)    Take 1 capsule by mouth daily as needed.    MOMETASONE FURO-FORMOTEROL FUM (DULERA) 200-5 MCG/ACT AERO    Inhale 2 puffs into  the lungs 2 (two) times daily.   MULTIPLE VITAMINS-MINERALS (MULTIVITAMIN PO)    Take by mouth daily.   NEXIUM 40 MG CAPSULE    TAKE 1 CAPSULE BY MOUTH DAILY.   SUMATRIPTAN (IMITREX) 25 MG TABLET    Take 25 mg by mouth as needed. Per PCP    VENLAFAXINE (EFFEXOR) 75 MG TABLET    Take 75 mg by mouth daily.  Modified Medications   No medications on file  Discontinued Medications   DOXYCYCLINE (VIBRAMYCIN) 50 MG CAPSULE    Take 1 capsule (50 mg total) by mouth 2 (two) times daily.   ESCITALOPRAM (LEXAPRO) 10 MG TABLET    Take 20 mg by mouth daily.     Subjective: Emily Calhoun is seen on a work in basis today. Her husband, Emily Calhoun, who is with her. She continues to be bothered with nasal drainage that she describes as yellow to brown in color. She uses a nettipot with saline solution and a small amount of mupirocin ointment several times daily. She will no thick yellow mucus after each saline irrigation. He also uses fluticasone nasal spray in the morning after her saline irrigation. She underwent endoscopic evaluation in August  which showed the sinuses were widely patent with minimal drainage and no mucosal edema or erythema. She's had 3 rounds of antibiotics in the last few months including moxifloxacin, clindamycin and doxycycline was minimal relief in her symptoms. She has not had any fever, chills or sweats. He is also complaining of discomfort in her upper teeth that she thinks are related to "sinus infection". She also remains extremely fatigued and believes that may be due to infection.  Review of Systems: Constitutional: positive for malaise, negative for anorexia, chills, fevers, sweats and weight loss Eyes: positive for mild purulent discharge from left eye recently, negative for irritation, redness and visual disturbance Ears, nose, mouth, throat, and face: positive for nasal congestion and intermittent popping in her left ear and discomfort in her anterior T., negative for ear drainage, earaches,  epistaxis, hearing loss, sore mouth and sore throat Respiratory: positive for cough and dyspnea on exertion, negative for sputum and wheezing Cardiovascular: negative Gastrointestinal: negative Genitourinary:negative Integument/breast: positive for recent pruritic rash on the back of her neck  Past Medical History  Diagnosis Date  . Asthma   . Sweet's disease   . Autoimmune disease   . Migraine   . Thyroid disease     hypothyroid  . STD (sexually transmitted disease)     HSV II  . Migraine   . Anxiety   . History of kidney stones   . IBS (irritable bowel syndrome)   . Cervical cancer     mom took DES  . Hypogammaglobulinemia     history of    History  Substance Use Topics  . Smoking status: Never Smoker   . Smokeless tobacco: Never Used  . Alcohol Use: 3.5 oz/week    7 drink(s) per week    Family History  Problem Relation Age of Onset  . Allergies Mother   . Heart disease Mother   . Heart disease Father   . Clotting disorder Father     per pt had clot in is intestines  . Diabetes Father   . Thyroid disease Father   . Diabetes Sister   . Thyroid disease Sister     Allergies  Allergen Reactions  . Azithromycin Anaphylaxis  . Cephalexin Anaphylaxis  . Cephalosporins Anaphylaxis  . Metronidazole Anaphylaxis  . Penicillins Anaphylaxis  . Sulfonamide Derivatives Anaphylaxis  . Clarithromycin   . Gentamicin Sulfate     REACTION: all mycins  . Ketoconazole   . Macrobid [Nitrofurantoin]   . Morphine Sulfate   . Singulair [Montelukast Sodium]     Nervousness and depression per pt  . Tobramycin-Dexamethasone     Objective: Temp: 97.8 F (36.6 C) (11/03 1104) Temp src: Oral (11/03 1104) BP: 124/86 mmHg (11/03 1104) Pulse Rate: 84 (11/03 1104)  General: She is concerned but in no distress Skin: Excoriations on her upper back and posterior neck without obvious primary lesion Oral: No oropharyngeal lesions and teeth in good condition Lungs: Clear Cor:  Regular S1 and S2 no murmurs  Assessment: I'm not convinced that her current sinus drainage is due to active infection. I suspect that is the major reason why she has had little relief from recent antibiotic treatments. I tried to help her understand that nasal mucous and positive cultures (sinus cultures grew MSSA in August and she has a history of positive fungal cultures more remotely) are not always do to an active infection and I reviewed the potential downside of continued antibiotic therapy including more problems with adverse events and allergies (  she has an extensive list of antimicrobial agents causing anaphylaxis), emergence of drug resistant organisms and C. difficile colitis. Her previous sinus surgery her sinuses are well drained in widely patent. No significant inflammation was noted that when she underwent endoscopic evaluation several months ago. I suggested that she stop using the mupirocin ointment and stay off of antibiotics for now. I do not believe that she has significant hypogammaglobulinemia now and I would not restart IVIG.  Plan: 1. Discontinue mupirocin ointment and saline irrigation 2. Observe off of antibiotics 3. Followup in 3 weeks   Cliffton Asters, MD [facility] [phone] pager   [phone] cell 05/19/2013, 12:01 PM

## 2013-06-09 ENCOUNTER — Ambulatory Visit: Payer: 59 | Admitting: Internal Medicine

## 2013-06-21 ENCOUNTER — Telehealth: Payer: Self-pay | Admitting: Gynecology

## 2013-06-21 NOTE — Telephone Encounter (Signed)
On Call Note:  Needs refill for Acylcovir 400 mg daily suppression.  # 30 no refill provided.  [facility] [phone]

## 2013-07-02 NOTE — Telephone Encounter (Signed)
See other encounter for 06/21/13. KW CMA / TF

## 2013-07-18 ENCOUNTER — Telehealth: Payer: Self-pay | Admitting: Obstetrics & Gynecology

## 2013-07-18 NOTE — Telephone Encounter (Signed)
Return call to patient, number confirmation. LMTCB

## 2013-07-18 NOTE — Telephone Encounter (Signed)
Patient states she has seen [facility] for this issue. Says that her eyes are swollen and puffy. And having problems with her vision. States she needs to see some one today. i told her the nurse would call her.

## 2013-07-18 NOTE — Telephone Encounter (Signed)
Patient called to say sorry she called the wrong office about her eyes. She got her doctors confused

## 2013-07-18 NOTE — Telephone Encounter (Signed)
Call to patient, confirmed she does not need our office, she was trying to call Mardene Speak OD at [facility]. They are seeing her for on-going eye problem for last 3 weeks.  Routing to provider for final review. Patient agreeable to disposition. Will close encounter

## 2013-07-24 ENCOUNTER — Telehealth: Payer: Self-pay | Admitting: *Deleted

## 2013-07-24 ENCOUNTER — Encounter: Payer: Self-pay | Admitting: Internal Medicine

## 2013-07-24 ENCOUNTER — Ambulatory Visit (INDEPENDENT_AMBULATORY_CARE_PROVIDER_SITE_OTHER): Payer: 59 | Admitting: Internal Medicine

## 2013-07-24 VITALS — BP 98/58 | HR 103 | Temp 97.8°F | Resp 12 | Ht 61.75 in | Wt 151.7 lb

## 2013-07-24 DIAGNOSIS — E039 Hypothyroidism, unspecified: Secondary | ICD-10-CM

## 2013-07-24 DIAGNOSIS — M25562 Pain in left knee: Secondary | ICD-10-CM | POA: Insufficient documentation

## 2013-07-24 DIAGNOSIS — M2550 Pain in unspecified joint: Secondary | ICD-10-CM

## 2013-07-24 DIAGNOSIS — R5382 Chronic fatigue, unspecified: Secondary | ICD-10-CM

## 2013-07-24 DIAGNOSIS — R5383 Other fatigue: Secondary | ICD-10-CM

## 2013-07-24 DIAGNOSIS — R5381 Other malaise: Secondary | ICD-10-CM

## 2013-07-24 NOTE — Telephone Encounter (Signed)
Pt shared these symptoms:  Numbness of her forehead, lids closing, difficulty seeing, sweats, dry mouth, whelps on her head and buttocks, decreased bowel movements when taking Linziss.  Requesting referral to endocrinologist.  RN spoke with Dr. Megan Salon about pt's request.

## 2013-07-24 NOTE — Telephone Encounter (Signed)
Pt has a scheduled appt with Dr. Cruzita Lederer for this afternoon.  Dr. Megan Salon informed.

## 2013-07-24 NOTE — Progress Notes (Signed)
Patient ID: Emily Calhoun, female   DOB: May 14, 1956, 58 y.o.   MRN: [id number]   HPI  Emily Calhoun is a 58 y.o.-year-old female, referred by her PCP, Dr.Wolters for evaluation for chronic fatigue and arthralgia. She also has hypothyroidism. This was a work-in appt - she is here with her husband who offers part of the history.  Pt describes multiple sxs for last 3 years, but some developed more recently: - She was on 3 types of ABx last mo for a sinus infection that never clear up  - developed heavy, swollen, eye lids  - in last 3 weeks.  - Around the same time, she was referred to Dr Casimiro Needle >> took her off Lexapro that she was taking for last 10 years >> started on Effexor >> insomnia, weight gain, hallucinogenic dreams >> she stopped this 6 days ago >> 4 days ago: puffy eyelids, vomiting, still insomnia >> saw PCP then, but a clear cause not found) >> saw psychiatrist the same day (again, a cause not found) >> feels a little better today - remains off psychotropic medications. Sees counselor now, but not going back to see Dr Casimiro Needle, does not have a new psychiatrist - extremely tired - did not sleep in 3 days (per husband) - memory problems - exercises - burns 1300 cal/day per her fitbit - weight gain despite exercising and lack of appetite - for last 10 days >> bloated - constipation >> Linzess helps  >> sees GI - sweats at night and during the day - hysterectomy in 1988 (feels these are not hot flushes)  - palpitations >> sees cardiology - constantly drinking water  - R shoulder pain radiating down the arm (nerve pain) - last month - occasionally little fingers swelling bilaterally - old injury >> lower back pain - tinnitus >> sees ENT - dry eyes - saw ophthalmologist  - rash - hand and buttocks - itchy scalp  - saw dermatologist >> referred to neurology  She had Medrol packs and Prednisone - long time ago. She has been on several steroid tapers in the past, >1 years ago.   She also  took a steroid every 2 weeks- 1 mo x 1 year, the again few mo. Stopped the infusions 2 years   For her hypothyroidism, she is on Levothyroxine 75 mcg. I reviewed pt's last TSH: 0.84 on 07/22/2013.   Reviewed recent CMP normal exc. Glu 101 in 07/22/2013, per records from PCP.    Reviewed recent CBG: normal Hb/HT, slightly high neutrophiles and slightly low Lymphs - will scan.   Reviewed last vitamin  D - 45.4 on 05/07/2013.   I reviewed her chart and she also has a history of major depression - Dr Casimiro Needle, hypogammaglobulinemia - Dr Learta Codding, asthma - Dr Melvyn Novas, kidney stones, CKD stage 2-3, GERD, vit D def  - Dr Sabra Heck, mild HL, tinnitus - Dr. Wilburn Cornelia. Also seeing Dr Tamala Julian with cardiology.  ROS: Constitutional: see HPI Eyes: + blurry vision, no xerophthalmia ENT: no sore throat, + nodules palpated in throat, no dysphagia/odynophagia, no hoarseness, + tinnitus Cardiovascular: no CP/SOB/palpitations/leg swelling Respiratory: no cough/SOB Gastrointestinal: no N/V/D/+ C Musculoskeletal: no muscle/joint aches Skin: + rashes - left big finger and right buttock - few red papules Neurological: no tremors/numbness/tingling/dizziness, + HA Psychiatric: + depression/+ anxiety  Past Medical History  Diagnosis Date  . Asthma   . Sweet's disease   . Autoimmune disease   . Migraine   . Thyroid disease  hypothyroid  . STD (sexually transmitted disease)     HSV II  . Migraine   . Anxiety   . History of kidney stones   . IBS (irritable bowel syndrome)   . Cervical cancer     mom took DES  . Hypogammaglobulinemia     history of   Past Surgical History  Procedure Laterality Date  . Colon surgery    . Nasal sinus surgery    . Vesicovaginal fistula closure w/ tah    . Abdominal hysterectomy  1988    ?retained ovaries for cx ca  . Hemorrhoid surgery    . Elbow surgery Right 1995    tendon repair  . Foot surgery Right   . Breast lumpectomy Left 2009    benign  . Breast reduction  surgery Bilateral 09/2010  . Excision vaginal cyst  10-25-2009    benign squamous cyst   History   Social History  . Marital Status: Married    Spouse Name: N/A    Number of Children: 2   Occupational History  . Futures trader    Social History Main Topics  . Smoking status: Never Smoker   . Smokeless tobacco: Never Used  . Alcohol Use:     Wine 2x/day  . Drug Use: No  . Sexual Activity: Yes    Birth Control/ Protection: Surgical     Comment: TAH   Current Outpatient Prescriptions on File Prior to Visit  Medication Sig Dispense Refill  . Acetaminophen (APAP) 325 MG tablet Take 650 mg by mouth as needed. Per PCP       . acyclovir (ZOVIRAX) 400 MG tablet Take 1 tablet (400 mg total) by mouth daily.  90 tablet  4  . albuterol (PROAIR HFA) 108 (90 BASE) MCG/ACT inhaler Inhale 2 puffs into the lungs every 6 (six) hours as needed for wheezing or shortness of breath.  1 Inhaler  3  . ciclesonide (ALVESCO) 80 MCG/ACT inhaler Inhale 1 puff into the lungs as needed.      . Cyanocobalamin (VITAMIN B 12 PO) Take by mouth daily.      . diphenhydrAMINE (BENADRYL) 50 MG capsule Take 50 mg by mouth. Two times a day , uses 2-3 days prior to IVIG infusions per PCP       . estradiol (VIVELLE-DOT) 0.075 MG/24HR Place 1 patch onto the skin 2 (two) times a week.  24 patch  4  . fluticasone (FLONASE) 50 MCG/ACT nasal spray Place 1 spray into the nose 2 (two) times daily.       Marland Kitchen levothyroxine (SYNTHROID, LEVOTHROID) 75 MCG tablet Take 75 mcg by mouth daily before breakfast.      . Linaclotide (LINZESS PO) Take 1 capsule by mouth daily as needed.       . Mometasone Furo-Formoterol Fum (DULERA) 200-5 MCG/ACT AERO Inhale 2 puffs into the lungs 2 (two) times daily.  1 Inhaler  11  . Multiple Vitamins-Minerals (MULTIVITAMIN PO) Take by mouth daily.      Marland Kitchen NEXIUM 40 MG capsule TAKE 1 CAPSULE BY MOUTH DAILY.  30 capsule  0  . SUMAtriptan (IMITREX) 25 MG tablet Take 25 mg by mouth as needed. Per PCP        . ALPRAZolam (XANAX) 0.25 MG tablet Take 0.5 mg by mouth at bedtime as needed for sleep.       No current facility-administered medications on file prior to visit.   Allergies  Allergen Reactions  . Azithromycin Anaphylaxis  .  Cephalexin Anaphylaxis  . Cephalosporins Anaphylaxis  . Metronidazole Anaphylaxis  . Penicillins Anaphylaxis  . Sulfonamide Derivatives Anaphylaxis  . Clarithromycin   . Effexor [Venlafaxine] Other (See Comments)    hallucinations  . Gentamicin Sulfate     REACTION: all mycins  . Ketoconazole   . Macrobid [Nitrofurantoin]   . Morphine Sulfate   . Singulair [Montelukast Sodium]     Nervousness and depression per pt  . Tobramycin-Dexamethasone    Family History  Problem Relation Age of Onset  . Allergies Mother   . Heart disease Mother   . Heart disease Father   . Clotting disorder Father     per pt had clot in is intestines  . Diabetes Father   . Thyroid disease Father   . Diabetes Sister   . Thyroid disease Sister    PE: BP 98/58  Pulse 103  Temp(Src) 97.8 F (36.6 C) (Oral)  Resp 12  Ht 5' 1.75" (1.568 m)  Wt 151 lb 11.2 oz (68.811 kg)  BMI 27.99 kg/m2  SpO2 97%  LMP 07/17/1986 Wt Readings from Last 3 Encounters:  07/24/13 151 lb 11.2 oz (68.811 kg)  05/19/13 152 lb (68.947 kg)  04/22/13 152 lb (68.947 kg)   Constitutional: slightly overweight, in NAD, appears anxious and tired, palpebral ptosis bilat. Eyes: PERRLA, EOMI, no exophthalmos ENT: moist mucous membranes, no thyromegaly, no cervical lymphadenopathy Cardiovascular: tachycardia, no MRG Respiratory: CTA B Gastrointestinal: abdomen soft, NT, ND, BS+ Musculoskeletal: no deformities, strength intact in all 4 Skin: moist, warm, no rashes Neurological: + tremor with outstretched hands, DTR normal in all 4  ASSESSMENT: 1. Fatigue  2. Arthralgia  3. Hypothyroidism  PLAN:  1. Fatigue Pt with multiple complaints. We discussed at length with her and her husband that I  cannot provide a unifying diagnosis for her symptoms, and I believe that most of them are not of endocrine origin.  - An important issue is that of her psychotropic medication, which has been stopped rather abruptly. She has been on an SSRI (Lexapro) for approximately 10 years, and then switched to Effexor, which did not help and was more detrimental, so she had to come off the medication. She is seeing a counselor, and I suggested to see a psychiatrist again to see if she does not need to be on anti-anxiety medication again. She appears anxious today and she is not sleeping. - I would like to rule out adrenal insufficiency as a cause for her fatigue and possibly night sweats, especially since she has a history of being on higher dose steroids for a year and a half with her gammaglobulin infusions, and then sporadically for her asthma. No recent use, though. She will return at 8 in the morning for a cosyntropin stimulation test. I advised her not to apply her estradiol patch tonight and also to stay away any steroid products for the next day. - she had multiple questions regarding her symptoms, which I answered to the best of my ability. - she and her husband agreed to try to rule out adrenal insufficiency first. If the workup is negative, in that case probably a rheumatology eval. would be helpful.  2. R Shoulder pain  - her arthralgia is not generalized - I am not sure of the cause of her pain, but doubt an endocrine cause  3. Hypothyroidism - on levothyroxine therapy - well-replaced with last TSH at goal, 0.87. - discussed why I believe her TSH is great and the fact that I would  not recommend Armour (discussed disadvantages) - she wonders if she has Hashimoto's ds, and I offered to check antiTPO ABs, but just for diagnosis reasons, as this would not change management  Component     Latest Ref Rng 07/25/2013 07/25/2013 07/25/2013        + 60 min  + 30 min  7:59 AM  Thyroid Peroxidase Antibody      <35.0 IU/mL   <10.0  C206 ACTH     10 - 46 pg/mL   14  Cortisol, Plasma      32.2 26.6 14.3   No adrenal insufficiency or Hashimoto's antibodies. Pt informed through MyChart and by phone.

## 2013-07-24 NOTE — Patient Instructions (Signed)
Please come to the lab tomorrow at 8 am for a stimulation test. We will schedule a new appt if the labs are abnormal. Hang in there!

## 2013-07-24 NOTE — Telephone Encounter (Signed)
Received verbal order for endocrinology referral from Dr. Megan Salon.

## 2013-07-25 ENCOUNTER — Telehealth: Payer: Self-pay | Admitting: Internal Medicine

## 2013-07-25 ENCOUNTER — Encounter: Payer: Self-pay | Admitting: Internal Medicine

## 2013-07-25 ENCOUNTER — Telehealth: Payer: Self-pay | Admitting: *Deleted

## 2013-07-25 ENCOUNTER — Other Ambulatory Visit (INDEPENDENT_AMBULATORY_CARE_PROVIDER_SITE_OTHER): Payer: 59 | Admitting: *Deleted

## 2013-07-25 ENCOUNTER — Other Ambulatory Visit (INDEPENDENT_AMBULATORY_CARE_PROVIDER_SITE_OTHER): Payer: 59

## 2013-07-25 DIAGNOSIS — R5383 Other fatigue: Secondary | ICD-10-CM

## 2013-07-25 DIAGNOSIS — R5381 Other malaise: Secondary | ICD-10-CM

## 2013-07-25 DIAGNOSIS — E039 Hypothyroidism, unspecified: Secondary | ICD-10-CM

## 2013-07-25 DIAGNOSIS — R5382 Chronic fatigue, unspecified: Secondary | ICD-10-CM

## 2013-07-25 LAB — CORTISOL
CORTISOL PLASMA: 14.3 ug/dL
CORTISOL PLASMA: 32.2 ug/dL
Cortisol, Plasma: 26.6 ug/dL

## 2013-07-25 MED ORDER — COSYNTROPIN 0.25 MG IJ SOLR
0.2500 mg | Freq: Once | INTRAMUSCULAR | Status: AC
  Administered 2013-07-25: 0.25 mg via INTRAMUSCULAR

## 2013-07-25 NOTE — Telephone Encounter (Signed)
Questionnaire Copied Below:     More Detail >>      [id number]      Emily Calhoun        Sent: Fri July 25, 2013  2:36 PM      To: [facility] weather helps.  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  Having other issues had a ton of bloodwork done.  Perphaps cortisol levels are off???  [name]             ----- Message -----      From: Emily Gully, MD      Sent: 04/26/2013 12:00 AM EDT      To: Emily Calhoun      Subject: Questionnaire             To ensure we are providing you the highest quality healthcare, we'd like to know how you are feeling after your recent visit. At your earliest convenience, please complete the brief follow-up assessment by clicking the Task: Questionnaire link listed above.             Thank you for your time in helping [name] improve our services and for partnering with [name] in your wellness and care.             Sincerely,             Your Care Team                [place] Size     Small     Medium     Large     Extra Extra Large              GAO BLAUER  04/22/2013 1:45 PM   Office Visit  MRN:  [id number]   Description: 58 year old female  Provider: Tanda Rockers, MD  Department: [facility]          Reason for Call      Acute Visit      Pt c/o increased SOB for the past 2 months. She states that she only gets SOB with exertion.               Diagnoses      Asthma    -  Primary      493.90               Call Documentation      Tanda Rockers, MD at 04/22/2013  2:04 PM      Status: Signed               Subjective:       Patient ID: Emily Calhoun, female    DOB: 21-Aug-1955   MRN: [id number]   Brief patient profile:  81 yowf  Daughter of GP never smoked but grew up in house of smokers with lots of ear infections and sore throat assoc with breathing difficulties going back to childhood and played competitive tennis in 20's with def poor aerobic  tolerance  and allergy tests neg as adult multiple times "always neg"  with low IgA and  IgM borderline by w/u in  Immunology at [facility] rec infusions of gammaglobulin not sure if they helped but if anything helped x maybe reduced sinus fungal infections but did not help breathing at all  then  stopped them completely November 2011 due to systemic reactions > referred to pulmonary clinic by [facility] in Sept 2012 with Pos methacholine challenge 04/2011 though only repoduced symptoms about 50% level.   History of Present Illness   03/21/2011 Initial pulmonary office eval cc not happy with" breathing control" x 15  Years no better with advair,  alvesco albuterol. 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  Intermittent nasal congestion does not appear to correlate with breathing difficulties. No purulent sputum at present. rec Symbicort Take 2 puffs first thing in am and then another 2 puffs about 12 hours later.   Stop the alvesco and only use the albuterol in an emergency Work on perfecting  inhaler technique:   .     Pepcid 20 mg after bfast and at bedtime for now automatically GERD diet       04/05/2011 f/u ov/Wert cc no change in tolerance to riding in heat (could not catch her breath assoc with loss of voice) despite using symbicort 2 puff 12 hours then then 2 puffs before event. No problem with indoor aerobics rec Be sure to keep using symbicort  Take 2 puffs first thing in am and then another 2 puffs about 12 hours later.   Only use your albuterol as a rescue medication to be used if you can't catch your breath Start nexium 40 mg Take 30-60 min before first meal of the day   Continue pepcid 20 mg one at bedtime      04/20/2011 f/u ov/Wert cc better overall with  50% reproduction of symptoms with Methacholine challenge test  and  better p albuterol - less than twice weekly. Could not articulate what the other 50% of symptoms she experiences but may be related to tachcardia from so much saba use before activity. Has never tried singulair.  rec Try adding singulair 10 mg one each pm - this should improve your sinus problems and your breathing, but you have to take it every day for a month Continue symbicort 160 mg Take 2 puffs first thing in am and then another 2 puffs about 12 hours later. Only use xopenex (rescue) when you really need it for breathing and stop the other forms of albuterol Only use zyrtec as needed for runny nose as needed      05/16/2011 f/u ov/Wert took singulair for two weeks and no real change the pattern of short of breath riding but no trouble with other forms of exercise but only used rescue 3 x total always while riding,  All three times needed rescue was w/in 3 hours of the symbicort,  Every time it happens is one third of the way cross country race x 2 min in with no improvement in any of her symptoms x cough since started symcicort and gerd rxl.    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 >>changed to [facility]    05/22/2011 Acute OV   Complains of increased SOB x 2 days worse with exertion and at night, nasal dryness, nosebleeds, wheezing, chest tightness. Was out in the cold air 2 days ago, she competes with competition horse racing, jumping fences and sprinting. Says symptoms predominately occur with racing. Within 30 secs of race starts to feel  tightness in chest and dry cough. Used xopenex 47min prior to race this weekend with no change in symptoms. Does have a lot of sinus congestion and drainage.   Had some nosebleed 3 days ago.   rec We are setting you up for a CT sinus .   Continue on Dulera 2 puffs Twice daily    Hold Flonase 2 days then restart May use Ayr Saline Gel As needed  For nasal irritation    Saline rinses As needed          04/22/2013 f/u ov/Wert re: asthma poor control on dulera and "prn" alvesco Chief Complaint   Patient presents with     Acute Visit       Pt c/o increased SOB for the past 2 months. She states that she only gets SOB with exertion.      after sev hours supine cough dry most nights   Doe x  Riding horseback daily x 2 months x worse than usual attributed to heat No relief prn alvesco  - has not tried rescue saba or rechallenge p ? eia Cardiac w/u by Linard Millers "one half my heart does not work"  = LBBB with nl echo per notes reviewed     No obvious pattern to  day to day or daytime variabilty or assoc chronic daytime or ex cough or cp or chest tightness, subjective wheeze overt sinus or hb symptoms. No unusual exp hx or h/o childhood pna/ asthma or knowledge of premature birth.   Sleeping ok without early am exacerbation  of respiratory  c/o's or need for noct saba. Also denies any obvious fluctuation of symptoms with weather or environmental changes or other aggravating or alleviating factors except as outlined above    Current Medications, Allergies, Complete Past Medical History, Past Surgical History, Family History, and Social History were reviewed in [employer] record.   ROS  The following are not active complaints unless bolded sore throat, dysphagia, dental problems, itching, sneezing,  nasal congestion or excess/ purulent secretions, ear ache,   fever, chills, sweats, unintended wt loss, pleuritic or exertional cp, hemoptysis,  orthopnea pnd or leg swelling, presyncope, palpitations, heartburn, abdominal pain, anorexia, nausea, vomiting, diarrhea  or change in bowel or urinary habits, change in stools or urine, dysuria,hematuria,  rash, arthralgias, visual complaints, headache, numbness weakness or ataxia or problems with walking or coordination,  change in mood/affect or memory.                      Objective:      Physical Exam   Anxious but quite pleasant amb  wf nad   Wt 03/21/2011  148 > 04/05/2011 >  148 04/20/2011  >  05/16/2011  149>>149 05/22/11 > 04/22/2013  152    HEENT: nl dentition, turbinates, and orophanx. Nl external ear canals without cough reflex     NECK :  without JVD/Nodes/TM/ nl carotid upstrokes bilaterally     LUNGS: no acc muscle use, clear to A and P bilaterally without cough on insp or exp maneuvers No wheezing noted.      CV:  RRR  no s3 or murmur or increase in P2, no edema    ABD:  soft and nontender with nl excursion in the supine position. No bruits or organomegaly, bowel sounds nl   MS:  warm without deformities, calf tenderness, cyanosis or clubbing             Assessment &  Plan:                     Encounter-Level Documents:      Electronic signature on 04/22/2013 1:52 PM           Encounter MyChart Messages      Read Composed From To Subject      Y 07/25/2013  2:36 PM Maycie W Jill Poling, MD [email or web address]      Y 04/24/2013  6:29 AM Tanda Rockers, MD [facility]a [facility] Questionnaire      Y 04/22/2013 12:30 AM Generic Mychart Sherrell [facility] Car Appointment Reminder             Created by      Encounter creation information not available                 [place], [place].

## 2013-07-25 NOTE — Telephone Encounter (Signed)
Pt called requesting lab results. Advised pt that results were released in [place]. Dr Cruzita Lederer said that it does not appear that you have adrenal insufficiency based on the cosyntropin stimulation test. I am still waiting for the antibody level. Pt understood. Advised pt we would call her when the results come in.

## 2013-07-25 NOTE — Telephone Encounter (Signed)
lmomtcb x1 

## 2013-07-28 LAB — ACTH: C206 ACTH: 14 pg/mL (ref 10–46)

## 2013-07-28 LAB — THYROID PEROXIDASE ANTIBODY: Thyroperoxidase Ab SerPl-aCnc: <10 IU/mL (ref <35.0)

## 2013-07-28 NOTE — Telephone Encounter (Signed)
Spoke with the pt and she states that she had blood work done and is having a pft and wants to wait for these results before anything else is done. [place] Bing, CMA

## 2013-07-29 ENCOUNTER — Telehealth: Payer: Self-pay | Admitting: *Deleted

## 2013-07-29 NOTE — Telephone Encounter (Signed)
Emily Calhoun called stating that you mentioned her seeing a Rheumatologist. I advised pt that maybe we should wait until her labs were all back from Dr Jerline Pain. She agreed. Pt wanted Dr Lilia Argue know that she is still itching on her head and on the area on her back. Also, while she was in Dr Marigene Ehlers office, she had an extreme hot flash. Dr Jerline Pain witness this. Please advise about the itching. Pt states she is going out of the country on Thursday, but will return on Monday.

## 2013-07-29 NOTE — Telephone Encounter (Signed)
Dr Jerline Pain with [facility] called concerning pt. She wanted Dr Cruzita Lederer to know that she is testing for Sjegren's Syndrome. Pt has severe dry eye. She has ordered the testing with [facility] and will send [name] copies of the test results when they are resulted. Dr Jerline Pain just wanted to let Dr Cruzita Lederer know for continuity of care for the pt. Be advised.

## 2013-07-29 NOTE — Telephone Encounter (Signed)
That is very nice of her! I will be looking for the results, then. Thank you.

## 2013-07-29 NOTE — Telephone Encounter (Signed)
I am not sure what is the cause for her itching, so I cannot suggest a treatment, unfortunately.Marland KitchenMarland KitchenMaybe her dermatologist can help? The referral to rheumatology has to come from PCP.

## 2013-07-30 ENCOUNTER — Telehealth: Payer: Self-pay | Admitting: Emergency Medicine

## 2013-07-30 NOTE — Telephone Encounter (Addendum)
Message left to return call to [place] at [phone].   Patient left detailed message on [employer] on 07/24/13 at 8:11 am. Requests information regarding [facility] and testing.   It appears that patient was seen by an endocrinologist on 07/24/13 at 1200 for a work in appointment and testing was done. Called to see if needs appointment with Dr. Sabra Heck.   Message that was sent in on website:  E-mailed through our website.  -----Original Message----- From: Emily Calhoun [[email or web address]]  Sent: Thursday, July 24, 2013 8:11 AM To: [email or web address] Subject: [place]   Name: Emily Calhoun Address: [address] City/State/Zip: [place] [address] Email: [email or web address] Phone: [phone] Comments: [email or web address]   Dr Sabra Heck, we were looking at this last night and Hashimoto's certainly lines up with [name]'s symptoms. She has weight gain, puffy eyes, dry skin, itching scalp, ringing in ears, dry mouth, etc. can you, or guide [name] to someone who can perform the thyroid test or whatever test that can determine whether this is the cause of her symptoms.  It seems that this is easily treatable if it's the diagnosis.  Thanks for your help  Emily Calhoun from my iPad  -------------------------------------------------- End of message

## 2013-07-31 ENCOUNTER — Other Ambulatory Visit: Payer: Self-pay | Admitting: *Deleted

## 2013-08-01 NOTE — Telephone Encounter (Signed)
Message left to return call to Beverely Suen at [phone].    

## 2013-08-05 ENCOUNTER — Ambulatory Visit
Admission: RE | Admit: 2013-08-05 | Discharge: 2013-08-05 | Disposition: A | Discharge status: Home / Self Care | Payer: 59 | Source: Ambulatory Visit | Attending: Family Medicine | Admitting: Family Medicine

## 2013-08-05 ENCOUNTER — Other Ambulatory Visit: Payer: Self-pay | Admitting: Family Medicine

## 2013-08-05 DIAGNOSIS — M79609 Pain in unspecified limb: Secondary | ICD-10-CM

## 2013-08-07 ENCOUNTER — Other Ambulatory Visit (HOSPITAL_COMMUNITY): Payer: Self-pay | Admitting: Neurosurgery

## 2013-08-07 DIAGNOSIS — M47812 Spondylosis without myelopathy or radiculopathy, cervical region: Secondary | ICD-10-CM

## 2013-08-07 DIAGNOSIS — M5104 Intervertebral disc disorders with myelopathy, thoracic region: Secondary | ICD-10-CM

## 2013-08-11 NOTE — Telephone Encounter (Signed)
Encounter closed

## 2013-08-11 NOTE — Telephone Encounter (Signed)
Dr. Sabra Heck, okay to close encounter? She was seen by endocrinology  On 07/24/13.

## 2013-08-12 ENCOUNTER — Ambulatory Visit (HOSPITAL_COMMUNITY)
Admission: RE | Admit: 2013-08-12 | Discharge: 2013-08-12 | Disposition: A | Discharge status: Home / Self Care | Payer: 59 | Source: Ambulatory Visit | Attending: Neurosurgery | Admitting: Neurosurgery

## 2013-08-12 DIAGNOSIS — M47812 Spondylosis without myelopathy or radiculopathy, cervical region: Secondary | ICD-10-CM

## 2013-08-12 DIAGNOSIS — M5104 Intervertebral disc disorders with myelopathy, thoracic region: Secondary | ICD-10-CM

## 2013-08-12 DIAGNOSIS — M5020 Other cervical disc displacement, unspecified cervical region: Secondary | ICD-10-CM | POA: Insufficient documentation

## 2013-08-12 DIAGNOSIS — M546 Pain in thoracic spine: Secondary | ICD-10-CM | POA: Insufficient documentation

## 2013-08-12 DIAGNOSIS — M5124 Other intervertebral disc displacement, thoracic region: Secondary | ICD-10-CM | POA: Insufficient documentation

## 2013-08-12 DIAGNOSIS — M542 Cervicalgia: Secondary | ICD-10-CM | POA: Insufficient documentation

## 2013-08-21 ENCOUNTER — Other Ambulatory Visit (HOSPITAL_COMMUNITY): Payer: Self-pay | Admitting: Neurosurgery

## 2013-08-21 DIAGNOSIS — S335XXA Sprain of ligaments of lumbar spine, initial encounter: Secondary | ICD-10-CM

## 2013-09-01 ENCOUNTER — Ambulatory Visit (HOSPITAL_COMMUNITY): Payer: 59

## 2013-10-07 ENCOUNTER — Telehealth: Payer: Self-pay | Admitting: Internal Medicine

## 2013-10-07 NOTE — Telephone Encounter (Signed)
lmomtcb x1 for pt--overdue for appt with MW Need to schedule appt

## 2013-10-08 ENCOUNTER — Telehealth: Payer: Self-pay | Admitting: Internal Medicine

## 2013-10-08 MED ORDER — MOMETASONE FURO-FORMOTEROL FUM 200-5 MCG/ACT IN AERO: 2.0000 | INHALATION_SPRAY | Freq: Two times a day (BID) | RESPIRATORY_TRACT | Status: DC

## 2013-10-08 NOTE — Telephone Encounter (Signed)
Spoke with pt. She scheduled appt Friday to see MW Refill has been sent

## 2013-10-08 NOTE — Telephone Encounter (Signed)
Duplicate message see prior phone note 10/07/13

## 2013-10-09 ENCOUNTER — Other Ambulatory Visit: Payer: Self-pay | Admitting: Internal Medicine

## 2013-10-09 DIAGNOSIS — R0602 Shortness of breath: Secondary | ICD-10-CM

## 2013-10-10 ENCOUNTER — Encounter: Payer: Self-pay | Admitting: Internal Medicine

## 2013-10-10 ENCOUNTER — Ambulatory Visit (INDEPENDENT_AMBULATORY_CARE_PROVIDER_SITE_OTHER): Payer: 59 | Admitting: Internal Medicine

## 2013-10-10 VITALS — BP 112/80 | HR 71 | Temp 97.9°F | Ht 61.0 in | Wt 150.0 lb

## 2013-10-10 DIAGNOSIS — R0602 Shortness of breath: Secondary | ICD-10-CM

## 2013-10-10 DIAGNOSIS — J45909 Unspecified asthma, uncomplicated: Secondary | ICD-10-CM

## 2013-10-10 DIAGNOSIS — J309 Allergic rhinitis, unspecified: Secondary | ICD-10-CM

## 2013-10-10 LAB — PULMONARY FUNCTION TEST
DL/VA % PRED: 153 %
DL/VA: 6.73 ml/min/mmHg/L
DLCO unc % pred: 114 %
DLCO unc: 23.18 ml/min/mmHg
FEF 25-75 Post: 2.02 L/sec
FEF 25-75 Pre: 1.98 L/sec
FEF2575-%CHANGE-POST: 2 %
FEF2575-%PRED-POST: 89 %
FEF2575-%PRED-PRE: 87 %
FEV1-%CHANGE-POST: 1 %
FEV1-%PRED-PRE: 93 %
FEV1-%Pred-Post: 95 %
FEV1-Post: 2.21 L
FEV1-Pre: 2.17 L
FEV1FVC-%CHANGE-POST: 3 %
FEV1FVC-%Pred-Pre: 100 %
FEV6-%CHANGE-POST: 0 %
FEV6-%Pred-Post: 94 %
FEV6-%Pred-Pre: 94 %
FEV6-PRE: 2.73 L
FEV6-Post: 2.72 L
FEV6FVC-%Change-Post: 0 %
FEV6FVC-%Pred-Post: 103 %
FEV6FVC-%Pred-Pre: 103 %
FVC-%Change-Post: -1 %
FVC-%PRED-POST: 90 %
FVC-%Pred-Pre: 92 %
FVC-Post: 2.72 L
FVC-Pre: 2.77 L
POST FEV1/FVC RATIO: 81 %
POST FEV6/FVC RATIO: 100 %
Pre FEV1/FVC ratio: 78 %
Pre FEV6/FVC Ratio: 100 %
RV % pred: 67 %
RV: 1.22 L
TLC % PRED: 87 %
TLC: 4.02 L

## 2013-10-10 MED ORDER — MOMETASONE FURO-FORMOTEROL FUM 200-5 MCG/ACT IN AERO: 2.0000 | INHALATION_SPRAY | Freq: Two times a day (BID) | RESPIRATORY_TRACT | Status: DC

## 2013-10-10 NOTE — Progress Notes (Signed)
Subjective:    Patient ID: Emily Calhoun, female    DOB: 1955-10-01   MRN: [id number]  Brief patient profile:  56 yowf  Daughter of GP never smoked but grew up in house of smokers with lots of ear infections and sore throat assoc with breathing difficulties going back to childhood and played competitive tennis in 20's with def poor aerobic tolerance  and allergy tests neg as adult multiple times "always neg"  with low IgA and  IgM borderline by w/u in  Immunology at [place] rec infusions of gammaglobulin not sure if they helped but if anything helped x maybe reduced sinus fungal infections but did not help breathing at all  then stopped them completely November 2011 due to systemic reactions > referred to pulmonary clinic by [facility] in Sept 2012 with Pos methacholine challenge 04/2011 though only repoduced symptoms about 50% level.  History of Present Illness  03/21/2011 Initial pulmonary office eval cc not happy with" breathing control" x 15  Years no better with advair,  alvesco albuterol. 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  Intermittent nasal congestion does not appear to correlate with breathing difficulties. No purulent sputum at present. rec Symbicort Take 2 puffs first thing in am and then another 2 puffs about 12 hours later.  Stop the alvesco and only use the albuterol in an emergency Work on perfecting  inhaler technique:   .    Pepcid 20 mg after bfast and at bedtime for now automatically GERD diet    04/05/2011 f/u ov/Murle Hellstrom cc no change in tolerance to riding in heat (could not catch her breath assoc with loss of voice) despite using symbicort 2 puff 12 hours then then 2 puffs before event. No problem with indoor aerobics rec Be sure to keep using symbicort  Take 2 puffs first thing in am and then another 2 puffs about  12 hours later.  Only use your albuterol as a rescue medication to be used if you can't catch your breath Start nexium 40 mg Take 30-60 min before first meal of the day  Continue pepcid 20 mg one at bedtime    04/20/2011 f/u ov/Mickle Campton cc better overall with  50% reproduction of symptoms with Methacholine challenge test  and better p albuterol - less than twice weekly. Could not articulate what the other 50% of symptoms she experiences but may be related to tachcardia from so much saba use before activity. Has never tried singulair.  rec Try adding singulair 10 mg one each pm - this should improve your sinus problems and your breathing, but you have to take it every day for a month Continue symbicort 160 mg Take 2 puffs first thing in am and then another 2 puffs about 12 hours later. Only use xopenex (rescue) when you really need it for breathing and stop the other forms of albuterol Only use zyrtec as needed for runny nose as needed    05/16/2011 f/u ov/Braeley Buskey took singulair for two weeks and no real change the pattern of short of breath riding but no trouble with other forms of exercise but only used rescue 3 x total always while riding,  All three times needed rescue was w/in 3 hours of the symbicort,  Every time it happens is one third of the way cross country race x  2 min in with no improvement in any of her symptoms x cough since started symcicort and gerd rxl.   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 >>changed to [facility]   05/22/2011 Acute OV  Complains of increased SOB x 2 days worse with exertion and at night, nasal dryness, nosebleeds, wheezing, chest tightness. Was out in the cold air 2 days ago, she competes with competition horse racing, jumping fences and sprinting. Says symptoms predominately occur with racing. Within 30 secs of race starts to feel tightness in chest and dry cough. Used xopenex 58min prior  to race this weekend with no change in symptoms. Does have a lot of sinus congestion and drainage.  Had some nosebleed 3 days ago.  rec We are setting you up for a CT sinus .  Continue on Dulera 2 puffs Twice daily   Hold Flonase 2 days then restart May use Ayr Saline Gel As needed  For nasal irritation  Saline rinses As needed       04/22/2013 f/u ov/Kaytie Ratcliffe re: asthma poor control on dulera and "prn" alvesco Chief Complaint  Patient presents with  . Acute Visit    Pt c/o increased SOB for the past 2 months. She states that she only gets SOB with exertion.    after sev hours supine cough dry most nights  Doe x  Riding horseback daily x 2 months x worse than usual attributed to heat No relief prn alvesco  - has not tried rescue saba or rechallenge p ? eia Cardiac w/u by Linard Millers "one half my heart does not work"  = LBBB with nl echo per notes reviewed rec Continue dulera 200 Take 2 puffs first thing in am and then another 2 puffs about 12 hours later.  Only use your albuterol (proire) as a rescue medication  - if not happy with the results, you need a cpst having done your am dulera but not the albuterol on that specific day and we'll do the test with spirometry before and after to see if you have any exercise induced asthma that is not being eliminated with dulera. Pepcid 20 mg one at bedtime for at least a month to see if your noct cough improves   10/10/2013 f/u ov/Townes Fuhs re: dulera 200 2 bid  Chief Complaint  Patient presents with  . Follow-up    Review pft.  Pt c/o SOB with exertion, especially with more than 3 minutes of rigorous exercize.  amt need albuterol before intense ex is about the same as before trial of dulera. Gets sob walking the course before a race but not with 45 m eliptical ex  No obvious pattern to  day to day or daytime variabilty or assoc chronic daytime or ex cough or cp or chest tightness, subjective wheeze overt sinus or hb symptoms. No unusual exp hx or h/o  childhood pna/ asthma or knowledge of premature birth.  Sleeping ok without early am exacerbation  of respiratory  c/o's or need for noct saba. Also denies any obvious fluctuation of symptoms with weather or environmental changes or other aggravating or alleviating factors except as outlined above   Current Medications, Allergies, Complete Past Medical History, Past Surgical History, Family History, and Social History were reviewed in [employer] record.  ROS  The following are not active complaints unless bolded sore throat, dysphagia, dental problems, itching, sneezing,  nasal  congestion or excess/ purulent secretions, ear ache,   fever, chills, sweats, unintended wt loss, pleuritic or exertional cp, hemoptysis,  orthopnea pnd or leg swelling, presyncope, palpitations, heartburn, abdominal pain, anorexia, nausea, vomiting, diarrhea  or change in bowel or urinary habits, change in stools or urine, dysuria,hematuria,  rash, arthralgias, visual complaints, headache, numbness weakness or ataxia or problems with walking or coordination,  change in mood/affect or memory.               Objective:   Physical Exam  Anxious but quite pleasant amb  wf nad  Wt 03/21/2011  148 > 04/05/2011 >  148 04/20/2011  >  05/16/2011  149>>149 05/22/11 > 04/22/2013  152 > 10/10/2013 150   HEENT: nl dentition, turbinates, and orophanx. Nl external ear canals without cough reflex   NECK :  without JVD/Nodes/TM/ nl carotid upstrokes bilaterally   LUNGS: no acc muscle use, clear to A and P bilaterally without cough on insp or exp maneuvers No wheezing noted.    CV:  RRR  no s3 or murmur or increase in P2, no edema   ABD:  soft and nontender with nl excursion in the supine position. No bruits or organomegaly, bowel sounds nl  MS:  warm without deformities, calf tenderness, cyanosis or clubbing         Assessment & Plan:

## 2013-10-10 NOTE — Progress Notes (Signed)
PFT done today. 

## 2013-10-10 NOTE — Patient Instructions (Addendum)
Try off dulera to see what difference if any this makes and if worse restart   If you are not happy with exercise tolerance, call [facility] [phone] and schedule a cpst  and do spirometry before and after but in the week before you do the test, restart dulera  200 Take 2 puffs first thing in am and then another 2 puffs about 12 hours later so it's in your system when we do the test.

## 2013-10-11 NOTE — Assessment & Plan Note (Signed)
Adequate control on present rx, reviewed > no change in rx needed   

## 2013-10-11 NOTE — Assessment & Plan Note (Addendum)
-   Symbicort 160 trial 03/21/11 > changed to dulera 200 05/17/2011     - HFA 50% 05/16/2011  > 90% 04/22/2013     - Methacholine challenge on Max gerd rx 04/19/11 > pos at 1.0  So added singulair trial 04/20/2011 > failed p two weeks>     - 05/17/11 rx dulera 200 2bid> d/c this 10/11/2013 since pt doe not perceive  effective     - PFTs wnl 10/11/2013 including fef 25-75 on dulera 200 2bid   No better on dulera so it's unlikely that what she's experiencing just with horseback riding is related to EIA but best way to prove it is to do cpst on dulera with spirometry before and after if she's still not happy with her ex tolerance

## 2013-11-27 ENCOUNTER — Encounter: Payer: Self-pay | Admitting: Obstetrics & Gynecology

## 2014-02-05 ENCOUNTER — Ambulatory Visit: Payer: 59 | Admitting: Obstetrics & Gynecology

## 2014-02-18 ENCOUNTER — Other Ambulatory Visit: Payer: Self-pay | Admitting: Obstetrics & Gynecology

## 2014-02-18 NOTE — Telephone Encounter (Signed)
Last refilled/AEX: 12/24/12 #24/4 Refills by Dr. Sabra Heck Last Mammogram: 01/21/13 Bi-Rads 1 AEX Scheduled: 03/20/14 with Dr. Sabra Heck   Please Advise, okay to refill?  (routed to Dr. Quincy Simmonds given Dr. Sabra Heck is out of office today.)

## 2014-03-06 ENCOUNTER — Ambulatory Visit: Payer: 59 | Admitting: Obstetrics & Gynecology

## 2014-03-20 ENCOUNTER — Ambulatory Visit (INDEPENDENT_AMBULATORY_CARE_PROVIDER_SITE_OTHER): Payer: 59 | Admitting: Obstetrics & Gynecology

## 2014-03-20 ENCOUNTER — Encounter: Payer: Self-pay | Admitting: Obstetrics & Gynecology

## 2014-03-20 VITALS — BP 132/82 | HR 72 | Ht 61.5 in | Wt 154.0 lb

## 2014-03-20 DIAGNOSIS — Z01419 Encounter for gynecological examination (general) (routine) without abnormal findings: Secondary | ICD-10-CM

## 2014-03-20 DIAGNOSIS — N9489 Other specified conditions associated with female genital organs and menstrual cycle: Secondary | ICD-10-CM

## 2014-03-20 MED ORDER — ACYCLOVIR 400 MG PO TABS: 400.0000 mg | ORAL_TABLET | Freq: Every day | ORAL | Status: DC

## 2014-03-20 MED ORDER — ESTRADIOL 0.075 MG/24HR TD PTTW: MEDICATED_PATCH | TRANSDERMAL | Status: DC

## 2014-03-20 NOTE — Patient Instructions (Signed)

## 2014-03-20 NOTE — Progress Notes (Signed)
58 y.o. [id number] MarriedCaucasianF here for annual exam.  No vaginal bleeding.  Had episode of right arm numbness and eyes swelling, fatigue, and insomnia.  All this lasted about a month.  Saw several providers.  Had questionable nerve impingement thought possibly due to horseback riding.    Having more skin issues this year.  Seeing dermatologist.  Using topical steroid cream.    Just got back from [place] for her 35th wedding anniversary.  Blood work all with Dr. Stephanie Acre.  Patient's last menstrual period was 07/17/1986.          Sexually active: Yes.    The current method of family planning is status post hysterectomy.    Exercising: Yes.    walking and horseback Smoker:  no  Health Maintenance: Pap:  11/15/10 WNL History of abnormal Pap:  yes MMG: 01/21/13-normal, 3D Colonoscopy:  2013-repeat in 4 years BMD:  2010-normal, Dr. Stephanie Acre' office TDaP:  2010 Screening Labs: PCP, Hb today: PCP, Urine today: negative    reports that she has never smoked. She has never used smokeless tobacco. She reports that she drinks about 7 ounces of alcohol per week. She reports that she does not use illicit drugs.  Past Medical History  Diagnosis Date  . Asthma   . Sweet's disease   . Autoimmune disease   . Migraine   . Thyroid disease     hypothyroid  . STD (sexually transmitted disease)     HSV II  . Migraine   . Anxiety   . History of kidney stones   . IBS (irritable bowel syndrome)   . Cervical cancer     mom took DES  . Hypogammaglobulinemia     history of    Past Surgical History  Procedure Laterality Date  . Colon surgery    . Nasal sinus surgery    . Vesicovaginal fistula closure w/ tah    . Abdominal hysterectomy  1988    ?retained ovaries for cx ca  . Hemorrhoid surgery    . Elbow surgery Right 1995    tendon repair  . Foot surgery Right   . Breast lumpectomy Left 2009    benign  . Breast reduction surgery Bilateral 09/2010  . Excision vaginal cyst  10-25-2009    benign  squamous cyst    Current Outpatient Prescriptions  Medication Sig Dispense Refill  . acyclovir (ZOVIRAX) 400 MG tablet Take 1 tablet (400 mg total) by mouth daily.  90 tablet  4  . albuterol (PROAIR HFA) 108 (90 BASE) MCG/ACT inhaler Inhale 2 puffs into the lungs every 6 (six) hours as needed for wheezing or shortness of breath.  1 Inhaler  3  . ALPRAZolam (XANAX) 0.25 MG tablet Take 0.5 mg by mouth at bedtime as needed for sleep.      . Cyanocobalamin (VITAMIN B 12 PO) Take by mouth daily.      . diphenhydrAMINE (BENADRYL) 50 MG capsule Take 50 mg by mouth as needed. Two times a day , uses 2-3 days prior to IVIG infusions per PCP      . estradiol (VIVELLE-DOT) 0.075 MG/24HR PLACE 1 PATCH ONTO THE SKIN 2 TIMES A WEEK.  8 patch  0  . fluticasone (FLONASE) 50 MCG/ACT nasal spray Place 1 spray into the nose 2 (two) times daily.       Marland Kitchen levothyroxine (SYNTHROID, LEVOTHROID) 75 MCG tablet Take 75 mcg by mouth daily before breakfast.      . Linaclotide (LINZESS PO) Take 1 capsule  by mouth daily as needed.       . Multiple Vitamins-Minerals (MULTIVITAMIN PO) Take by mouth daily.      Marland Kitchen NEXIUM 40 MG capsule TAKE 1 CAPSULE BY MOUTH DAILY.  30 capsule  0  . SUMAtriptan (IMITREX) 25 MG tablet Take 25 mg by mouth as needed. Per PCP        No current facility-administered medications for this visit.    Family History  Problem Relation Age of Onset  . Allergies Mother   . Heart disease Mother   . Heart disease Father   . Clotting disorder Father     per pt had clot in is intestines  . Diabetes Father   . Thyroid disease Father   . Diabetes Sister   . Thyroid disease Sister     ROS:  Pertinent items are noted in HPI.  Otherwise, a comprehensive ROS was negative.  Exam:   BP 132/82  Pulse 72  Ht 5' 1.5" (1.562 m)  Wt 154 lb (69.854 kg)  BMI 28.63 kg/m2  LMP 07/17/1986  Weight change: +10 pounds  Height: 5' 1.5" (156.2 cm)  Ht Readings from Last 3 Encounters:  03/20/14 5' 1.5" (1.562 m)   10/10/13 5\' 1"  (1.549 m)  07/24/13 5' 1.75" (1.568 m)    General appearance: alert, cooperative and appears stated age Head: Normocephalic, without obvious abnormality, atraumatic Neck: no adenopathy, supple, symmetrical, trachea midline and thyroid normal to inspection and palpation Lungs: clear to auscultation bilaterally Breasts: normal appearance, no masses or tenderness Heart: regular rate and rhythm Abdomen: soft, non-tender; bowel sounds normal; no masses,  no organomegaly Extremities: extremities normal, atraumatic, no cyanosis or edema Skin: Skin color, texture, turgor normal. No rashes or lesions Lymph nodes: Cervical, supraclavicular, and axillary nodes normal. No abnormal inguinal nodes palpated Neurologic: Grossly normal   Pelvic: External genitalia:  no lesions              Urethra:  normal appearing urethra with no masses, tenderness or lesions              Bartholins and Skenes: normal                 Vagina: normal appearing vagina with normal color and discharge, no lesions              Cervix: absent and 2.5cm right vaginal cuff cyst              Pap taken: No. Bimanual Exam:  Uterus:  uterus absent              Adnexa: normal adnexa and no mass, fullness, tenderness               Rectovaginal: Confirms.  Can cause similar pain with palpation of pelvic floor muscles.                Anus:  normal sphincter tone, no lesions  A:  Well Woman with normal exam  H/O TAH 1988 due to dysplasia, h/o neg follow-up for 20+ years  H/O vaginal cuff cyst.  Stable. 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  Appt made for pt. Rx for Acyclovir 400mg  qd #90/4 RF  Vivelle dot 0.075 twice weekly #24/4 RF  Return for PUS.  Expect cuff cyst but  would like to assess ovaries as well.   PT  referral to [facility]. return annually or prn  An After Visit Summary was printed and given to the patient.

## 2014-03-25 ENCOUNTER — Telehealth: Payer: Self-pay | Admitting: Obstetrics & Gynecology

## 2014-03-25 NOTE — Telephone Encounter (Signed)
Left message for patient to call back. Need to go over benefits and schedule PUS °

## 2014-04-06 NOTE — Telephone Encounter (Signed)
Left message for patient to call back. Need to go over benefits and schedule PUS °

## 2014-04-21 ENCOUNTER — Telehealth: Payer: Self-pay | Admitting: Obstetrics & Gynecology

## 2014-04-21 NOTE — Telephone Encounter (Signed)
Spoke with patient. Advised that per benefit quote received, she will have 0 patient liability for PUS. Patient agreeable. Scheduled PUS. Mailed the In-Office procedure form that includes appointment date and time, patient copay, and cancellation policy.

## 2014-05-06 ENCOUNTER — Telehealth: Payer: Self-pay | Admitting: Obstetrics & Gynecology

## 2014-05-06 NOTE — Telephone Encounter (Signed)
Left message for patient to call back. PUS scheduled 10.27 has been cancelled. Needs to be rescheduled.

## 2014-05-07 NOTE — Telephone Encounter (Signed)
Left message for patient to call back  

## 2014-05-12 ENCOUNTER — Other Ambulatory Visit: Payer: 59

## 2014-05-12 ENCOUNTER — Other Ambulatory Visit: Payer: 59 | Admitting: Obstetrics & Gynecology

## 2014-05-18 ENCOUNTER — Encounter: Payer: Self-pay | Admitting: Obstetrics & Gynecology

## 2014-09-04 ENCOUNTER — Other Ambulatory Visit: Payer: Self-pay | Admitting: Internal Medicine

## 2014-10-19 ENCOUNTER — Telehealth: Payer: Self-pay | Admitting: Obstetrics & Gynecology

## 2014-10-19 NOTE — Telephone Encounter (Signed)
Spoke with patient. Patient would like to reschedule PUS at this time. Patient was last seen with Dr.Kimes 03/20/2014 for aex. Reccommended PUS due to intermittent pelvic floor/vaginal pain. Patient was scheduled for Oct 2015 but had to cancel due to provider being out of office. Patient has had intermittent pain since and would to be seen for evaluation. PUS appointment scheduled for 4/7 at 2pm with 2:30pm ultrasound with Dr.Verrill. "I also feel like I have a knot on my left side when I exercise that I want her to look at. I do not know if it is scar tissue or something else." Also requesting to have thyroid checked as she is currently on synthroid. Advised will need to let Dr.Paluch know of lab request at appointment. Patient is agreeable.  Cc: Gabriel Cirri as may need benefit recheck with new year  Routing to provider for final review. Patient agreeable to disposition. Will close encounter

## 2014-10-19 NOTE — Telephone Encounter (Signed)
Patient is calling to schedule an ultrasound. She states it was suppose to been done back in December. She also wants to get her thyroid checked she wants T3 AND T4

## 2014-10-20 ENCOUNTER — Telehealth: Payer: Self-pay | Admitting: Obstetrics & Gynecology

## 2014-10-20 NOTE — Telephone Encounter (Signed)
Left message for patient to call back. Need to discuss rescheduling PUS to 04.12.2016 per Gay Filler

## 2014-10-22 ENCOUNTER — Other Ambulatory Visit: Payer: 59

## 2014-10-22 ENCOUNTER — Other Ambulatory Visit: Payer: 59 | Admitting: Obstetrics & Gynecology

## 2014-10-27 ENCOUNTER — Ambulatory Visit (INDEPENDENT_AMBULATORY_CARE_PROVIDER_SITE_OTHER): Payer: 59

## 2014-10-27 ENCOUNTER — Other Ambulatory Visit: Payer: Self-pay | Admitting: Obstetrics & Gynecology

## 2014-10-27 ENCOUNTER — Ambulatory Visit (INDEPENDENT_AMBULATORY_CARE_PROVIDER_SITE_OTHER): Payer: 59 | Admitting: Obstetrics & Gynecology

## 2014-10-27 VITALS — BP 124/80 | Wt 153.8 lb

## 2014-10-27 DIAGNOSIS — N949 Unspecified condition associated with female genital organs and menstrual cycle: Secondary | ICD-10-CM | POA: Diagnosis not present

## 2014-10-27 DIAGNOSIS — E038 Other specified hypothyroidism: Secondary | ICD-10-CM | POA: Diagnosis not present

## 2014-10-27 DIAGNOSIS — N941 Dyspareunia: Secondary | ICD-10-CM

## 2014-10-27 DIAGNOSIS — R1032 Left lower quadrant pain: Secondary | ICD-10-CM | POA: Diagnosis not present

## 2014-10-27 DIAGNOSIS — N898 Other specified noninflammatory disorders of vagina: Secondary | ICD-10-CM

## 2014-10-27 DIAGNOSIS — IMO0002 Reserved for concepts with insufficient information to code with codable children: Secondary | ICD-10-CM

## 2014-10-27 LAB — THYROID PANEL WITH TSH
FREE THYROXINE INDEX: 2.6 (ref 1.4–3.8)
T3 UPTAKE: 29 % (ref 22–35)
T4, Total: 8.8 ug/dL (ref 4.5–12.0)
TSH: 0.758 u[IU]/mL (ref 0.350–4.500)

## 2014-10-27 MED ORDER — ESTRADIOL 0.1 MG/GM VA CREA: TOPICAL_CREAM | VAGINAL | Status: DC

## 2014-10-27 NOTE — Progress Notes (Deleted)
Subjective:     Patient ID: Emily Calhoun, female   DOB: 04-04-1956, 59 y.o.   MRN: [id number]  HPI   Review of Systems     Objective:   Physical Exam

## 2014-10-28 LAB — T3: T3, Total: 115.8 ng/dL (ref 80.0–204.0)

## 2014-10-29 ENCOUNTER — Encounter: Payer: Self-pay | Admitting: Obstetrics & Gynecology

## 2014-10-29 NOTE — Progress Notes (Signed)
Patient ID: Emily Calhoun, female   DOB: 04-26-1956, 59 y.o.   MRN: [id number]  59 y.o.  G3PW MWF here for discussion of three issues today.  First, she has been experiencing worsening LLQ pain that occurs after exercising but specifically after horseback riding.  It is a deep and low ache.  She feels it has been present off and on for the past three years.  Secondly, she has questions about her thyroid medication.  Pt has been hypothyroid for many years.  Has been on supplementation and dosage has not changed in recent years.  She reports feeling fatigued all the time.  She states her friends tell her she is very energetic but she doesn't actually feel that way.  Has questions about testing all of her thyroid hormones and whether switching to a "natural" thyroid replacement would be better.  Third, she is having some pain with intercourse.  It is not all of the time and she cannot predict.  She has been on HRT fo rmany years and dosage has not changed since I started seeing her.  Because she called in reporting LLQ pain, PUS was scheduled today.  Results were discused with pt  Patient's last menstrual period was 07/17/1986.  Sexually active:  yes  Contraception: s/p hysterectomy  FINDINGS: UTERUS: surgically absent however there is a 2.5 x 1.7cm cyst at apex of vaginal cuff EMS: n/a ADNEXA:   Left ovary 3.0 x 1.7 x 1.3cm   Right ovary 2.7 x 1.5 x 0.8cm CUL DE SAC: no free fluid  Results reviewed with pt.  Cyst was removed from vaginal apex several years ago.  Pathology was negative.  Procedure was difficult and I felt this would probably return.  It has.  I am not sure this is playing a role with her vaginal complaints as the pain/discomfort she is feeling is not always present.  Feel a trial of vaginal estrogen could be helpful.  Administration and dosage discussed with pt.    Also, feel referral for pelvic PT could be helpful considering when pt is having pain the most--after exercise and  specifically horseback riding.  D/W pt at least trying this a few visits to see if will help.  Will refer to Ileana Roup, PT, at [facility].  Finally, I am happy to test to thyroid hormones but I will refer her back to her endocrinologist for any dosage adjustments.  Pt has a misunderstanding that "natural" hormones mean this is human thyroid replacement.  Even "natural" thyroid hormone is porcine.  Pt was unaware that "natural" doesn't mean it is the exact same as human tissue.  I really think she should continue this discussion with her endocrinologist.  Pt understands and states she will but would like to have lab work done today.  Assessment:  LLQ pain, specifically after horseback riding Hypothyroidism Vaginal cuff cyst, s/p excision and now recurrence Possible vaginal atrophy with some discomfort with intercourse  Plan: referral to Ileana Roup at [facility] TSH with panel and T3.  Encouraged pt to continue discussion with endocrinologist Repeat PUS 6 months to monitor vaginal cuff cyst  ~30 minutes spent with patient >50% of time was in face to face discussion of above.  Visits are often lengthy with pt as she often wants to discuss medical problems that are not always within my specialty.

## 2014-11-23 ENCOUNTER — Telehealth: Payer: Self-pay | Admitting: Obstetrics & Gynecology

## 2014-11-23 NOTE — Telephone Encounter (Signed)
Per Threasa Beards, multiple attempts have been made to reach patient for scheduling. Patient is not responsive.

## 2015-03-25 ENCOUNTER — Other Ambulatory Visit: Payer: Self-pay | Admitting: Obstetrics & Gynecology

## 2015-03-25 NOTE — Telephone Encounter (Signed)
Medication refill request: Zovirax Last AEX:  03-20-14 Next AEX: 04-27-15 Last MMG (if hormonal medication request): 03-20-14 WNL  Refill authorized: please advise

## 2015-04-27 ENCOUNTER — Ambulatory Visit (INDEPENDENT_AMBULATORY_CARE_PROVIDER_SITE_OTHER): Payer: 59

## 2015-04-27 ENCOUNTER — Ambulatory Visit (INDEPENDENT_AMBULATORY_CARE_PROVIDER_SITE_OTHER): Payer: 59 | Admitting: Obstetrics & Gynecology

## 2015-04-27 ENCOUNTER — Encounter: Payer: Self-pay | Admitting: Obstetrics & Gynecology

## 2015-04-27 VITALS — BP 102/60 | HR 72 | Resp 16 | Ht 61.75 in | Wt 152.0 lb

## 2015-04-27 DIAGNOSIS — Z9013 Acquired absence of bilateral breasts and nipples: Secondary | ICD-10-CM

## 2015-04-27 DIAGNOSIS — N898 Other specified noninflammatory disorders of vagina: Secondary | ICD-10-CM

## 2015-04-27 DIAGNOSIS — Z0000 Encounter for general adult medical examination without abnormal findings: Secondary | ICD-10-CM | POA: Diagnosis not present

## 2015-04-27 DIAGNOSIS — Z124 Encounter for screening for malignant neoplasm of cervix: Secondary | ICD-10-CM | POA: Diagnosis not present

## 2015-04-27 DIAGNOSIS — Z01419 Encounter for gynecological examination (general) (routine) without abnormal findings: Secondary | ICD-10-CM | POA: Diagnosis not present

## 2015-04-27 DIAGNOSIS — Z9889 Other specified postprocedural states: Secondary | ICD-10-CM

## 2015-04-27 LAB — POCT URINALYSIS DIPSTICK
Bilirubin, UA: NEGATIVE
GLUCOSE UA: NEGATIVE
Ketones, UA: NEGATIVE
Leukocytes, UA: NEGATIVE
Nitrite, UA: NEGATIVE
PH UA: 5
Protein, UA: NEGATIVE
UROBILINOGEN UA: NEGATIVE

## 2015-04-27 NOTE — Progress Notes (Signed)
59 y.o. [id number] MarriedCaucasianF here for annual exam.  Doing well.  Pt still having hives.  Has seen neurologist within the last six months.  Was on a Medrol dose pack last week.  Dr. Stephanie Acre gave this to her.  Reports this helped her.  Takes medication for sleep because of the itching due to the hives.  Has also seen a dermatologist, Dr. Renda Rolls.  Was referred to Ileana Roup last year due to pelvic floor pain.  Pt is now seeing a chiropractor and she states this has helped a lot.  She feels a lot of this is due to her lifetime of riding horses.  Her chiropractor agrees.  Pt here also for PUS of vaginal cyst.  Cyst was excised 4/11 due to pt feeling pain with intercourse.  Pathology showed:  1. CYST, EXCISION, VAGINAL TISSUE : BENIGN SQUAMOUS CYST.  Cyst has slowly recurred.  Pt feels it is larger and she is feeling more discomfort with intercourse.  Would like it removed whether it is larger or not.  Finally, would like to see a plastic surgeon for some possible reshaping around breasts due to changes around scar from prior breast reduction 3/12.  Has a place on lateral right breast that is not smooth and she is not happy with it.  Would prefer to see different surgeon than who did the reduction.    Patient's last menstrual period was 07/17/1986.          Sexually active: Yes.    The current method of family planning is status post hysterectomy.    Exercising: Yes.    walking, eliptical, and horseback Smoker:  No  Health Maintenance: Pap: 11/15/10 WNL History of abnormal Pap: yes, h/o cervical cancer-mom took DES MMG: 04/08/14, normal Colonoscopy: 2013-repeat in 5 years BMD: 2010-normal, Dr. Stephanie Acre' office TDaP: 2010 Screening Labs: PCP, Hb today: PCP, Urine today: RBC-trace   reports that she has never smoked. She has never used smokeless tobacco. She reports that she drinks about 7.0 oz of alcohol per week. She reports that she does not use illicit drugs.  Past Medical History   Diagnosis Date  . Asthma   . Sweet's disease   . Autoimmune disease ([place])   . Migraine   . Thyroid disease     hypothyroid  . STD (sexually transmitted disease)     HSV II  . Migraine   . Anxiety   . History of kidney stones   . IBS (irritable bowel syndrome)   . Cervical cancer [facility])     mom took DES  . Hypogammaglobulinemia ([place])     history of    Past Surgical History  Procedure Laterality Date  . Colon surgery    . Nasal sinus surgery    . Vesicovaginal fistula closure w/ tah    . Abdominal hysterectomy  1988    ?retained ovaries for cx ca  . Hemorrhoid surgery    . Elbow surgery Right 1995    tendon repair  . Foot surgery Right   . Breast lumpectomy Left 2009    benign  . Breast reduction surgery Bilateral 09/2010  . Excision vaginal cyst  10-25-2009    benign squamous cyst    Family History  Problem Relation Age of Onset  . Allergies Mother   . Heart disease Mother   . Heart disease Father   . Clotting disorder Father     per pt had clot in is intestines  . Diabetes Father   .  Thyroid disease Father   . Diabetes Sister   . Thyroid disease Sister     ROS:  Pertinent items are noted in HPI.  Otherwise, a comprehensive ROS was negative.  Exam:   General appearance: alert, cooperative and appears stated age Head: Normocephalic, without obvious abnormality, atraumatic Neck: no adenopathy, supple, symmetrical, trachea midline and thyroid normal to inspection and palpation Lungs: clear to auscultation bilaterally Breasts: normal appearance, no masses or tenderness Heart: regular rate and rhythm Abdomen: soft, non-tender; bowel sounds normal; no masses,  no organomegaly Extremities: extremities normal, atraumatic, no cyanosis or edema Skin: Skin color, texture, turgor normal. No rashes or lesions Lymph nodes: Cervical, supraclavicular, and axillary nodes normal. No abnormal inguinal nodes palpated Neurologic: Grossly normal   Pelvic: External  genitalia:  no lesions              Urethra:  normal appearing urethra with no masses, tenderness or lesions              Bartholins and Skenes: normal                 Vagina: normal appearing vagina with normal color and discharge, no lesions              Cervix: absent              Pap taken: Yes.   Bimanual Exam:  Uterus:  uterus absent              Adnexa: no mass, fullness, tenderness               Rectovaginal: Confirms               Anus:  normal sphincter tone, no lesions  Chaperone was present for exam.  PUS results for today.   Uterus:  Surgically absent Left ovary 2.5 x 1.0 x 1.8cm with 53mm simple cyst. Right ovary 2.9 x 0.7 x 0.8cm Vaginal cuff cyst 2.3 x 2.9cm, thin walled, avascular, single thin septation noted.  Avascular.   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   H/O hypothyroidism  H/O HSV II  H/O multiple antibiotic allergies  PMP on HRT  H/O hypogamglobulinemia, released from hematology  P: Mammogram needed. Appt made for pt when she was on office today. Pap obtained Rx for Acyclovir 400mg  qd #90/4 RF.  Pt aware this is not typical dosing but this is how she takes it and it controls symptoms for her.  Vivelle dot 0.075 twice weekly #24/4 RF.  D/w pt tapering off this by age 74.  States she does not want to make any changes right now.  Risks of DVT/PE/stroke/MI/breast cancer reviewed.    Referral for consultation regarding cyst excision.  As this is small and slowly increasing in size, feel ok to monitor.  Pt desires removal and as I did this once and it recurred, feel should have another opinion regarding removal. Referral to plastic surgeon at [facility] for consultation regarding reshaping of breasts. return annually or prn  Additional 15 minutes spent with patient in face to face discussion of above breast concerns  and vaginal cyst.  This was in additional to annual exam.

## 2015-04-29 ENCOUNTER — Encounter: Payer: Self-pay | Admitting: Obstetrics & Gynecology

## 2015-04-29 LAB — IPS PAP TEST WITH REFLEX TO HPV

## 2015-05-03 MED ORDER — ACYCLOVIR 400 MG PO TABS: 400.0000 mg | ORAL_TABLET | Freq: Every day | ORAL | Status: AC

## 2015-05-03 MED ORDER — ESTRADIOL 0.075 MG/24HR TD PTTW: MEDICATED_PATCH | TRANSDERMAL | Status: DC

## 2015-05-07 ENCOUNTER — Telehealth: Payer: Self-pay | Admitting: Obstetrics & Gynecology

## 2015-05-07 NOTE — Telephone Encounter (Signed)
Spoke with patient regarding referral appointments. Patient understood appointment dates and times with each office. Provided patient with contact information for both offices (obgyn and plastic surgery) should she need to cancel/reschedule. Ok to close.

## 2015-05-07 NOTE — Telephone Encounter (Signed)
Patient calling to check the status of a referral.

## 2015-05-07 NOTE — Telephone Encounter (Signed)
Called patient to review referral appointment information. Left voicemail to return call.

## 2015-07-22 MED FILL — ESCITALOPRAM 20 MG TABLET: 20 | 90 days supply | Qty: 90 | Fill #0

## 2015-07-29 DIAGNOSIS — H02834 Dermatochalasis of left upper eyelid: Secondary | ICD-10-CM | POA: Diagnosis not present

## 2015-07-29 DIAGNOSIS — H04123 Dry eye syndrome of bilateral lacrimal glands: Secondary | ICD-10-CM | POA: Diagnosis not present

## 2015-07-29 DIAGNOSIS — H40013 Open angle with borderline findings, low risk, bilateral: Secondary | ICD-10-CM | POA: Diagnosis not present

## 2015-07-29 DIAGNOSIS — H02832 Dermatochalasis of right lower eyelid: Secondary | ICD-10-CM | POA: Diagnosis not present

## 2015-07-29 DIAGNOSIS — H02831 Dermatochalasis of right upper eyelid: Secondary | ICD-10-CM | POA: Diagnosis not present

## 2015-08-11 MED FILL — XIIDRA 5% EYE DROPS: 5 | 30 days supply | Qty: 60 | Fill #2

## 2015-08-17 DIAGNOSIS — R102 Pelvic and perineal pain: Secondary | ICD-10-CM | POA: Diagnosis not present

## 2015-08-17 DIAGNOSIS — N898 Other specified noninflammatory disorders of vagina: Secondary | ICD-10-CM | POA: Diagnosis not present

## 2015-08-17 DIAGNOSIS — K5900 Constipation, unspecified: Secondary | ICD-10-CM | POA: Diagnosis not present

## 2015-08-17 DIAGNOSIS — Z90710 Acquired absence of both cervix and uterus: Secondary | ICD-10-CM | POA: Diagnosis not present

## 2015-08-17 DIAGNOSIS — M5431 Sciatica, right side: Secondary | ICD-10-CM | POA: Diagnosis not present

## 2015-08-17 DIAGNOSIS — Z8759 Personal history of other complications of pregnancy, childbirth and the puerperium: Secondary | ICD-10-CM | POA: Diagnosis not present

## 2015-08-26 DIAGNOSIS — L821 Other seborrheic keratosis: Secondary | ICD-10-CM | POA: Diagnosis not present

## 2015-08-26 DIAGNOSIS — L905 Scar conditions and fibrosis of skin: Secondary | ICD-10-CM | POA: Diagnosis not present

## 2015-08-26 DIAGNOSIS — L700 Acne vulgaris: Secondary | ICD-10-CM | POA: Diagnosis not present

## 2015-08-26 DIAGNOSIS — L570 Actinic keratosis: Secondary | ICD-10-CM | POA: Diagnosis not present

## 2015-08-26 MED FILL — VENTOLIN HFA 90 MCG INHALER: 108 (90 BAS | 21 days supply | Qty: 18 | Fill #1

## 2015-08-27 DIAGNOSIS — Z1231 Encounter for screening mammogram for malignant neoplasm of breast: Secondary | ICD-10-CM | POA: Diagnosis not present

## 2015-09-01 DIAGNOSIS — R102 Pelvic and perineal pain: Secondary | ICD-10-CM | POA: Diagnosis not present

## 2015-09-01 DIAGNOSIS — N898 Other specified noninflammatory disorders of vagina: Secondary | ICD-10-CM | POA: Diagnosis not present

## 2015-09-06 MED FILL — ESTRADIOL 0.075 MG PATCH: 0.075 | 84 days supply | Qty: 24 | Fill #1

## 2015-09-08 DIAGNOSIS — H02831 Dermatochalasis of right upper eyelid: Secondary | ICD-10-CM | POA: Diagnosis not present

## 2015-09-08 DIAGNOSIS — H2513 Age-related nuclear cataract, bilateral: Secondary | ICD-10-CM | POA: Diagnosis not present

## 2015-09-08 DIAGNOSIS — H02834 Dermatochalasis of left upper eyelid: Secondary | ICD-10-CM | POA: Diagnosis not present

## 2015-09-08 DIAGNOSIS — H02832 Dermatochalasis of right lower eyelid: Secondary | ICD-10-CM | POA: Diagnosis not present

## 2015-09-08 DIAGNOSIS — H02835 Dermatochalasis of left lower eyelid: Secondary | ICD-10-CM | POA: Diagnosis not present

## 2015-09-08 DIAGNOSIS — H04123 Dry eye syndrome of bilateral lacrimal glands: Secondary | ICD-10-CM | POA: Diagnosis not present

## 2015-09-15 DIAGNOSIS — K219 Gastro-esophageal reflux disease without esophagitis: Secondary | ICD-10-CM | POA: Diagnosis not present

## 2015-09-15 DIAGNOSIS — G43909 Migraine, unspecified, not intractable, without status migrainosus: Secondary | ICD-10-CM | POA: Diagnosis not present

## 2015-09-15 DIAGNOSIS — F419 Anxiety disorder, unspecified: Secondary | ICD-10-CM | POA: Diagnosis not present

## 2015-09-15 DIAGNOSIS — K589 Irritable bowel syndrome without diarrhea: Secondary | ICD-10-CM | POA: Diagnosis not present

## 2015-09-15 DIAGNOSIS — G8929 Other chronic pain: Secondary | ICD-10-CM | POA: Diagnosis not present

## 2015-09-15 DIAGNOSIS — E039 Hypothyroidism, unspecified: Secondary | ICD-10-CM | POA: Diagnosis not present

## 2015-09-15 DIAGNOSIS — R102 Pelvic and perineal pain: Secondary | ICD-10-CM | POA: Diagnosis not present

## 2015-09-15 DIAGNOSIS — F329 Major depressive disorder, single episode, unspecified: Secondary | ICD-10-CM | POA: Diagnosis not present

## 2015-09-15 DIAGNOSIS — N898 Other specified noninflammatory disorders of vagina: Secondary | ICD-10-CM | POA: Diagnosis not present

## 2015-09-20 MED FILL — ACYCLOVIR 400 MG TABLET: 400 | 90 days supply | Qty: 90 | Fill #2

## 2015-09-20 MED FILL — XIIDRA 5% EYE DROPS: 5 | 30 days supply | Qty: 60 | Fill #3

## 2015-09-21 DIAGNOSIS — R350 Frequency of micturition: Secondary | ICD-10-CM | POA: Diagnosis not present

## 2015-09-21 DIAGNOSIS — M545 Low back pain: Secondary | ICD-10-CM | POA: Diagnosis not present

## 2015-09-21 DIAGNOSIS — Z48816 Encounter for surgical aftercare following surgery on the genitourinary system: Secondary | ICD-10-CM | POA: Diagnosis not present

## 2015-09-21 DIAGNOSIS — Z90710 Acquired absence of both cervix and uterus: Secondary | ICD-10-CM | POA: Diagnosis not present

## 2015-09-21 DIAGNOSIS — R102 Pelvic and perineal pain: Secondary | ICD-10-CM | POA: Diagnosis not present

## 2015-09-27 ENCOUNTER — Encounter: Payer: Self-pay | Admitting: Internal Medicine

## 2015-09-27 ENCOUNTER — Ambulatory Visit (INDEPENDENT_AMBULATORY_CARE_PROVIDER_SITE_OTHER): Payer: 59 | Admitting: Internal Medicine

## 2015-09-27 VITALS — BP 116/72 | HR 80 | Ht 61.75 in | Wt 154.6 lb

## 2015-09-27 DIAGNOSIS — K5909 Other constipation: Secondary | ICD-10-CM

## 2015-09-27 DIAGNOSIS — K589 Irritable bowel syndrome without diarrhea: Secondary | ICD-10-CM

## 2015-09-27 DIAGNOSIS — R1030 Lower abdominal pain, unspecified: Secondary | ICD-10-CM | POA: Diagnosis not present

## 2015-09-27 DIAGNOSIS — K219 Gastro-esophageal reflux disease without esophagitis: Secondary | ICD-10-CM | POA: Diagnosis not present

## 2015-09-27 DIAGNOSIS — Z86010 Personal history of colonic polyps: Secondary | ICD-10-CM

## 2015-09-27 DIAGNOSIS — K5900 Constipation, unspecified: Secondary | ICD-10-CM

## 2015-09-27 MED ORDER — ESOMEPRAZOLE MAGNESIUM 40 MG PO CPDR: 40.0000 mg | DELAYED_RELEASE_CAPSULE | Freq: Every day | ORAL | Status: AC

## 2015-09-27 MED ORDER — LINACLOTIDE 145 MCG PO CAPS: 145.0000 ug | ORAL_CAPSULE | Freq: Every day | ORAL | Status: DC | PRN

## 2015-09-27 MED ORDER — RANITIDINE HCL 150 MG PO TABS: 150.0000 mg | ORAL_TABLET | Freq: Every evening | ORAL | Status: DC

## 2015-09-27 MED FILL — raNITIdine HCL 150 MG TABS: 150 | 90 days supply | Qty: 90 | Fill #0

## 2015-09-27 MED FILL — LINZESS 145 MCG CAPSULE: 145 | 30 days supply | Qty: 30 | Fill #0

## 2015-09-27 MED FILL — ESOMEPRAZOLE MAG DR 40 MG C: 40 | 90 days supply | Qty: 90 | Fill #0

## 2015-09-27 NOTE — Progress Notes (Signed)
Patient ID: Emily Calhoun, female   DOB: 1955/11/02, 59 y.o.   MRN: [id number] HPI: Emily Calhoun is a 60 year old female with a past medical history of GERD, chronic constipation, colon polyps, internal hemorrhoids status post hemorrhoidectomy in 1996, IBS, history of hypothyroidism, hypogammaglobulinemia, cervical cancer status post hysterectomy and recent GYN surgery to remove a cyst near the vaginal cuff who is seen to evaluate chronic reflux with cough and also constipation. She is here alone today. She reports that she's been having issues with GERD for decades. This is occasional heartburn and frequent nocturnal coughing. She often has sour brash at night. No dysphagia or odynophagia. She does take Nexium 40 mg daily in the morning. She has never had an upper endoscopy. She does have issues with chronic long-standing constipation. Stools are hard and often difficult to pass. She is currently using Linzess 145 g about 3 days per week. She has never taken this daily. After taking it normally causes urgent loose stools at times diarrhea. Occasionally she has to take it 2 days before having a bowel movement. She denies blood in her stool or melena. Occasionally she will wake up at night with lower abdominal cramping pain which can be so severe she feels nausea and if she might vomit. She usually does not vomit. Bowel movements often make this pain resolved. She does not use tobacco. She does drink alcohol 1-2 drinks daily. She is married. She is an active horseback rider.  She has previous GI history with Dr. Earlean Shawl. She has had prior colonoscopy and prior polypectomy. It is felt that her last colonoscopy was 2009 but it remains unknown as to findings at this colonoscopy. Records have been requested.  She denies a family history of GI tract malignancy.  Past Medical History  Diagnosis Date  . Asthma   . Sweet's disease   . Autoimmune disease ([place])   . Migraine   . Thyroid disease     hypothyroid  .  STD (sexually transmitted disease)     HSV II  . Migraine   . Anxiety   . History of kidney stones   . IBS (irritable bowel syndrome)   . Cervical cancer [facility])     mom took DES  . Hypogammaglobulinemia ([place])     history of  . Anal fissure   . Asthma   . Hyperlipidemia   . Hepatitis A     Past Surgical History  Procedure Laterality Date  . Nasal sinus surgery      x 2  . Abdominal hysterectomy  1988    ?retained ovaries for cx ca  . Internal hemorrhoidectomy    . Elbow surgery Right 1995    tendon repair  . Foot surgery Right   . Breast lumpectomy Left 2009    benign; ?cyst  . Breast reduction surgery Bilateral 09/2010  . Excision vaginal cyst  10-25-2009    benign squamous cyst  . Elbow surgery Right     Outpatient Prescriptions Prior to Visit  Medication Sig Dispense Refill  . acyclovir (ZOVIRAX) 400 MG tablet Take 1 tablet (400 mg total) by mouth daily. 90 tablet 4  . albuterol (PROAIR HFA) 108 (90 BASE) MCG/ACT inhaler Inhale 2 puffs into the lungs every 6 (six) hours as needed for wheezing or shortness of breath. 1 Inhaler 3  . ALPRAZolam (XANAX) 0.25 MG tablet Take 0.5 mg by mouth at bedtime as needed for sleep.    . clonazePAM (KLONOPIN) 1 MG tablet TAKE 1/2-1 TABLET  AT BEDTIME as needed  0  . diphenhydrAMINE (BENADRYL) 50 MG capsule Take 50 mg by mouth as needed. Two times a day , uses 2-3 days prior to IVIG infusions per PCP    . escitalopram (LEXAPRO) 20 MG tablet   3  . estradiol (ESTRACE) 0.1 MG/GM vaginal cream 1 gram vaginally twice weekly 42.5 g 1  . estradiol (VIVELLE-DOT) 0.075 MG/24HR PLACE 1 PATCH ONTO THE SKIN 2 TIMES A WEEK. 24 patch 4  . levothyroxine (SYNTHROID, LEVOTHROID) 75 MCG tablet Take 75 mcg by mouth daily before breakfast.    . NEXIUM 40 MG capsule TAKE 1 CAPSULE BY MOUTH DAILY. 30 capsule 0  . bacitracin ophthalmic ointment APP IN LEFT EYE QHS UTD  0  . Cyanocobalamin (VITAMIN B 12 PO) Take by mouth daily.    . fluorometholone (FML) 0.1 %  ophthalmic suspension USE 1 DROP IN LEFT EYE QID  0  . Linaclotide (LINZESS PO) Take 1 capsule by mouth daily as needed.     . Multiple Vitamins-Minerals (MULTIVITAMIN PO) Take by mouth daily.    . RESTASIS 0.05 % ophthalmic emulsion   3  . SUMAtriptan (IMITREX) 25 MG tablet Take 25 mg by mouth as needed. Per PCP      No facility-administered medications prior to visit.    Allergies  Allergen Reactions  . Azithromycin Anaphylaxis  . Cephalexin Anaphylaxis  . Cephalosporins Anaphylaxis  . Metronidazole Anaphylaxis  . Penicillins Anaphylaxis  . Sulfonamide Derivatives Anaphylaxis  . Clarithromycin   . Effexor [Venlafaxine] Other (See Comments)    hallucinations  . Gentamicin Sulfate     REACTION: all mycins  . Ketoconazole   . Macrobid [Nitrofurantoin]   . Morphine Sulfate   . Singulair [Montelukast Sodium]     Nervousness and depression per pt  . Tobramycin-Dexamethasone   . Wellbutrin [Bupropion]     Hallucinations     Family History  Problem Relation Age of Onset  . Allergies Mother   . Heart disease Mother   . Heart disease Father   . Clotting disorder Father     per pt had clot in is intestines  . Diabetes Father   . Thyroid disease Father   . Diabetes Sister   . Thyroid disease Sister   . Colon cancer Neg Hx   . Esophageal cancer Neg Hx   . Stomach cancer Neg Hx   . Pancreatic cancer Neg Hx   . Liver disease Neg Hx     Social History  Substance Use Topics  . Smoking status: Never Smoker   . Smokeless tobacco: Never Used  . Alcohol Use: 4.8 oz/week    8 Standard drinks or equivalent per week    ROS: As per history of present illness, otherwise negative  BP 116/72 mmHg  Pulse 80  Ht 5' 1.75" (1.568 m)  Wt 154 lb 9.6 oz (70.126 kg)  BMI 28.52 kg/m2  LMP 07/17/1986 Constitutional: Well-developed and well-nourished. No distress. HEENT: Normocephalic and atraumatic. Oropharynx is clear and moist. No oropharyngeal exudate. Conjunctivae are normal.  No  scleral icterus. Neck: Neck supple. Trachea midline. Cardiovascular: Normal rate, regular rhythm and intact distal pulses. No M/R/G Pulmonary/chest: Effort normal and breath sounds normal. No wheezing, rales or rhonchi. Abdominal: Soft, mild lower abdominal tenderness without rebound or guarding, nondistended. Bowel sounds active throughout. There are no masses palpable. No hepatosplenomegaly. Extremities: no clubbing, cyanosis, or edema Lymphadenopathy: No cervical adenopathy noted. Neurological: Alert and oriented to person place and time. Skin: Skin  is warm and dry. No rashes noted. Psychiatric: Normal mood and affect. Behavior is normal.  RELEVANT IMAGING: Review of records reveals a CT scan of the abdomen and pelvis with contrast 2014. This was unremarkable from a GI tract perspective.  ASSESSMENT/PLAN:  60 year old female with a past medical history of GERD, chronic constipation, colon polyps, internal hemorrhoids status post hemorrhoidectomy in 1996, IBS, history of hypothyroidism, hypogammaglobulinemia, cervical cancer status post hysterectomy and recent GYN surgery to remove a cyst near the vaginal cuff who is seen to evaluate chronic reflux with cough and also constipation  1. GERD/coughing -- she has had long-standing reflux disease with some symptoms of uncontrolled reflux disease, specifically sour brash, intermittent heartburn and nocturnal coughing. I have recommended upper endoscopy for direct visualization given long-standing GERD with uncontrolled symptoms on daily PPI. We discussed the risks, benefits and alternatives and she is agreeable to proceed. I'm going to add ranitidine 150 mg each evening to try to attempt to control acid more effectively. GERD diet recommended. She will continue Nexium 40 mg, 30 minutes to an hour before breakfast daily. Rule out Barrett's esophagus and hiatal hernia at upper endoscopy. Other options if in the evening H2 blocker fails would be to try  Nexium twice a day or a longer acting PPI once daily such as Dexilant.  2. Chronic constipation -- she has also symptoms of irritable bowel with lower abdominal cramping. This is likely exacerbated by constipation. I have recommended that she try Linzess 145 g on a daily basis going forward. This should help prevent constipation which in turn exacerbates the lower abdominal cramping. We discussed how often patients will have loose stools or even diarrhea for several days when starting Linzess but this should normalize to more normal formed bowel movements multiple days per week without the cramping and pain. If she continues to have loose, urgent or diarrheal stools when trying Linzess on a daily basis, and we should try another laxative, likely Amitiza. I explained that diarrhea is not be intended result of Linzess.  3. History of colon polyps -- unclear if she has a history of adenomatous or hyperplastic polyps. We will request records from Dr. Earlean Shawl to determine when she is due for screening/surveillance colonoscopy. I'm going to hold a spot for now for possible colonoscopy on the same day as her upper endoscopy in case she is due at this time. If not the colonoscopy portion will be canceled and rescheduled at the appropriate interval. We discussed the risks, benefits and alternatives to colonoscopy and she is agreeable to proceed      US:[id number] [place], [place] [address]

## 2015-09-27 NOTE — Patient Instructions (Signed)
You have been scheduled for an endoscopy and colonoscopy. Please follow the written instructions given to you at your visit today. Please pick up your prep supplies at the pharmacy within the next 1-3 days. If you use inhalers (even only as needed), please bring them with you on the day of your procedure. Your physician has requested that you go to www.startemmi.com and enter the access code given to you at your visit today. This web site gives a general overview about your procedure. However, you should still follow specific instructions given to you by our office regarding your preparation for the procedure.  We may cancel your colonoscopy after looking over [facility] records. We will contact you to let you know one way or the other.  We have sent the following medications to your pharmacy for you to pick up at your convenience: Ranitidine 150 mg every evening Nexium 40 mg daily Linzess 145 mcg daily

## 2015-09-28 DIAGNOSIS — J45990 Exercise induced bronchospasm: Secondary | ICD-10-CM | POA: Diagnosis not present

## 2015-09-28 DIAGNOSIS — K589 Irritable bowel syndrome without diarrhea: Secondary | ICD-10-CM | POA: Diagnosis not present

## 2015-09-28 DIAGNOSIS — E039 Hypothyroidism, unspecified: Secondary | ICD-10-CM | POA: Diagnosis not present

## 2015-09-28 DIAGNOSIS — E785 Hyperlipidemia, unspecified: Secondary | ICD-10-CM | POA: Diagnosis not present

## 2015-09-28 DIAGNOSIS — K219 Gastro-esophageal reflux disease without esophagitis: Secondary | ICD-10-CM | POA: Diagnosis not present

## 2015-09-28 DIAGNOSIS — F419 Anxiety disorder, unspecified: Secondary | ICD-10-CM | POA: Diagnosis not present

## 2015-09-28 DIAGNOSIS — H02831 Dermatochalasis of right upper eyelid: Secondary | ICD-10-CM | POA: Diagnosis not present

## 2015-09-28 DIAGNOSIS — F329 Major depressive disorder, single episode, unspecified: Secondary | ICD-10-CM | POA: Diagnosis not present

## 2015-09-28 DIAGNOSIS — H02834 Dermatochalasis of left upper eyelid: Secondary | ICD-10-CM | POA: Diagnosis not present

## 2015-09-29 MED FILL — BACITRACIN-POLYMYXIN EYE OI: 500-10000 | 10 days supply | Qty: 4 | Fill #0

## 2015-10-04 ENCOUNTER — Telehealth: Payer: Self-pay | Admitting: *Deleted

## 2015-10-04 NOTE — Telephone Encounter (Signed)
Dr Hilarie Fredrickson has received colonoscopy report from Dr Earlean Shawl from 10/2011. Patient's colonoscopy at that time was normal with a 5 year recall interval (due to family history colon cancer). Per Dr Hilarie Fredrickson, patient's recall colonoscopy should be placed for 10/2016. I have spoken to patient to advise that we will cancel the colonoscopy portion of the endo/colon that is scheduled for Thursday and instead, we will put a colonoscopy recall in EPIC for 2018. She verbalizes understanding.

## 2015-10-07 ENCOUNTER — Ambulatory Visit (AMBULATORY_SURGERY_CENTER): Payer: 59 | Admitting: Internal Medicine

## 2015-10-07 ENCOUNTER — Encounter: Payer: Self-pay | Admitting: Internal Medicine

## 2015-10-07 ENCOUNTER — Telehealth: Payer: Self-pay | Admitting: Internal Medicine

## 2015-10-07 VITALS — BP 102/56 | HR 77 | Temp 98.6°F | Resp 15 | Ht 61.0 in | Wt 154.0 lb

## 2015-10-07 DIAGNOSIS — K219 Gastro-esophageal reflux disease without esophagitis: Secondary | ICD-10-CM

## 2015-10-07 DIAGNOSIS — E669 Obesity, unspecified: Secondary | ICD-10-CM | POA: Diagnosis not present

## 2015-10-07 DIAGNOSIS — F341 Dysthymic disorder: Secondary | ICD-10-CM | POA: Diagnosis not present

## 2015-10-07 DIAGNOSIS — E039 Hypothyroidism, unspecified: Secondary | ICD-10-CM | POA: Diagnosis not present

## 2015-10-07 DIAGNOSIS — J45909 Unspecified asthma, uncomplicated: Secondary | ICD-10-CM | POA: Diagnosis not present

## 2015-10-07 DIAGNOSIS — R05 Cough: Secondary | ICD-10-CM

## 2015-10-07 DIAGNOSIS — R053 Chronic cough: Secondary | ICD-10-CM

## 2015-10-07 MED ORDER — SODIUM CHLORIDE 0.9 % IV SOLN: 500.0000 mL | INTRAVENOUS | Status: DC

## 2015-10-07 NOTE — Patient Instructions (Signed)
Discharge instructions given. Normal exam. Resume previous medications. YOU HAD AN ENDOSCOPIC PROCEDURE TODAY AT THE [facility]:   Refer to the procedure report that was given to you for any specific questions about what was found during the examination.  If the procedure report does not answer your questions, please call your gastroenterologist to clarify.  If you requested that your care partner not be given the details of your procedure findings, then the procedure report has been included in a sealed envelope for you to review at your convenience later.  YOU SHOULD EXPECT: Some feelings of bloating in the abdomen. Passage of more gas than usual.  Walking can help get rid of the air that was put into your GI tract during the procedure and reduce the bloating. If you had a lower endoscopy (such as a colonoscopy or flexible sigmoidoscopy) you may notice spotting of blood in your stool or on the toilet paper. If you underwent a bowel prep for your procedure, you may not have a normal bowel movement for a few days.  Please Note:  You might notice some irritation and congestion in your nose or some drainage.  This is from the oxygen used during your procedure.  There is no need for concern and it should clear up in a day or so.  SYMPTOMS TO REPORT IMMEDIATELY:    Following upper endoscopy (EGD)  Vomiting of blood or coffee ground material  New chest pain or pain under the shoulder blades  Painful or persistently difficult swallowing  New shortness of breath  Fever of 100F or higher  Black, tarry-looking stools  For urgent or emergent issues, a gastroenterologist can be reached at any hour by calling [phone].   DIET: Your first meal following the procedure should be a small meal and then it is ok to progress to your normal diet. Heavy or fried foods are harder to digest and may make you feel nauseous or bloated.  Likewise, meals heavy in dairy and vegetables can increase  bloating.  Drink plenty of fluids but you should avoid alcoholic beverages for 24 hours.  ACTIVITY:  You should plan to take it easy for the rest of today and you should NOT DRIVE or use heavy machinery until tomorrow (because of the sedation medicines used during the test).    FOLLOW UP: Our staff will call the number listed on your records the next business day following your procedure to check on you and address any questions or concerns that you may have regarding the information given to you following your procedure. If we do not reach you, we will leave a message.  However, if you are feeling well and you are not experiencing any problems, there is no need to return our call.  We will assume that you have returned to your regular daily activities without incident.  If any biopsies were taken you will be contacted by phone or by letter within the next 1-3 weeks.  Please call us at [phone] if you have not heard about the biopsies in 3 weeks.    SIGNATURES/CONFIDENTIALITY: You and/or your care partner have signed paperwork which will be entered into your electronic medical record.  These signatures attest to the fact that that the information above on your After Visit Summary has been reviewed and is understood.  Full responsibility of the confidentiality of this discharge information lies with you and/or your care-partner. 

## 2015-10-07 NOTE — Progress Notes (Signed)
Report to PACU, RN, vss, BBS= Clear.  

## 2015-10-07 NOTE — Telephone Encounter (Signed)
[name] did you want a different prescription sent in for this pt for linzess? Please advise.

## 2015-10-07 NOTE — Op Note (Signed)
[place] Patient Name: Emily Calhoun Procedure Date: 10/07/2015 1:02 PM MRN: [id number] Endoscopist: Jerene Bears , MD Age: 60 Referring MD:  Date of Birth: 11-11-1955 Gender: Female Procedure:                Upper GI endoscopy Indications:              Gastro-esophageal reflux disease, Chest pain (non                            cardiac), Chronic cough Medicines:                Monitored Anesthesia Care Procedure:                Pre-Anesthesia Assessment:                           - Prior to the procedure, a History and Physical                            was performed, and patient medications and                            allergies were reviewed. The patient's tolerance of                            previous anesthesia was also reviewed. The risks                            and benefits of the procedure and the sedation                            options and risks were discussed with the patient.                            All questions were answered, and informed consent                            was obtained. Prior Anticoagulants: The patient has                            taken no previous anticoagulant or antiplatelet                            agents. ASA Grade Assessment: II - A patient with                            mild systemic disease. After reviewing the risks                            and benefits, the patient was deemed in                            satisfactory condition to undergo the procedure.  After obtaining informed consent, the endoscope was                            passed under direct vision. Throughout the                            procedure, the patient's blood pressure, pulse, and                            oxygen saturations were monitored continuously. The                            Model GIF-HQ190 [id number]) scope was introduced                            through the mouth, and advanced to the second part                        of duodenum. The upper GI endoscopy was                            accomplished without difficulty. The patient                            tolerated the procedure well. Scope In: Scope Out: Findings:      The esophagus was normal.      The stomach was normal.      The examined duodenum was normal.      The cardia and gastric fundus were normal on retroflexion. Complications:            No immediate complications. Estimated Blood Loss:     Estimated blood loss: none. Impression:               - Normal esophagus.                           - Normal stomach.                           - Normal examined duodenum.                           - No specimens collected. Recommendation:           - Patient has a contact number available for                            emergencies. The signs and symptoms of potential                            delayed complications were discussed with the                            patient. Return to normal activities tomorrow.                            Written discharge instructions were provided  to the                            patient.                           - Resume previous diet.                           - Continue present medications.                           - Perform ambulatory esophageal manometry if                            symptoms persist (atypical chest pain). Procedure Code(s):        --- Professional ---                           [phone], Esophagogastroduodenoscopy, flexible,                            transoral; diagnostic, including collection of                            specimen(s) by brushing or washing, when performed                            (separate procedure) CPT copyright 2016 [employer]. All rights reserved. L[employer]uan [employer]. Hilarie Fredrickson, MD Jerene Bears, MD 10/07/2015 1:24:27 PM This report has been signed electronically. Number of Addenda: 0 Referring MD:      [name]

## 2015-10-08 ENCOUNTER — Telehealth: Payer: Self-pay | Admitting: *Deleted

## 2015-10-08 MED ORDER — LINACLOTIDE 72 MCG PO CAPS: 72.0000 ug | ORAL_CAPSULE | Freq: Every day | ORAL | Status: DC

## 2015-10-08 MED FILL — LINZESS 72 MCG CAPSULE: 72 | 30 days supply | Qty: 30 | Fill #0

## 2015-10-08 NOTE — Telephone Encounter (Signed)
Script sent to pharmacy and pt aware. 

## 2015-10-08 NOTE — Telephone Encounter (Signed)
Yes, she would like to try the new lower dose of Linzess 72.5 mcg daily

## 2015-10-08 NOTE — Telephone Encounter (Signed)
   Follow up Call-  Call back number 10/07/2015  Post procedure Call Back phone  # [phone]  Permission to leave phone message Yes     Patient questions:  Do you have a fever, pain , or abdominal swelling? No. Pain Score  0 *  Have you tolerated food without any problems? Yes.    Have you been able to return to your normal activities? Yes.    Do you have any questions about your discharge instructions: Diet   No. Medications  No. Follow up visit  No.  Do you have questions or concerns about your Care? No.  Actions: * If pain score is 4 or above: No action needed, pain <4.

## 2015-10-11 ENCOUNTER — Telehealth: Payer: Self-pay | Admitting: *Deleted

## 2015-10-11 MED FILL — ESCITALOPRAM 20 MG TABLET: 20 | 90 days supply | Qty: 90 | Fill #1

## 2015-10-11 MED FILL — LEVOTHYROXINE 75 MCG TABLET: 75 | 90 days supply | Qty: 90 | Fill #1

## 2015-10-11 NOTE — Telephone Encounter (Signed)
  Follow up Call-  Call back number 10/07/2015  Post procedure Call Back phone  # [phone]  Permission to leave phone message Yes     Patient questions:  Do you have a fever, pain , or abdominal swelling? No. Pain Score  0 *  Have you tolerated food without any problems? Yes.     Have you been able to return to your normal activities? Yes.    Do you have any questions about your discharge instructions: Diet   No. Medications  No. Follow up visit  No.  Do you have questions or concerns about your Care? No.  Actions: * If pain score is 4 or above: No action needed, pain <4.

## 2015-10-20 DIAGNOSIS — H52223 Regular astigmatism, bilateral: Secondary | ICD-10-CM | POA: Diagnosis not present

## 2015-10-20 DIAGNOSIS — H5203 Hypermetropia, bilateral: Secondary | ICD-10-CM | POA: Diagnosis not present

## 2015-10-20 DIAGNOSIS — H524 Presbyopia: Secondary | ICD-10-CM | POA: Diagnosis not present

## 2015-10-25 MED FILL — XIIDRA 5% EYE DROPS: 5 | 30 days supply | Qty: 60 | Fill #0

## 2015-10-27 DIAGNOSIS — R350 Frequency of micturition: Secondary | ICD-10-CM | POA: Diagnosis not present

## 2015-10-27 DIAGNOSIS — N3289 Other specified disorders of bladder: Secondary | ICD-10-CM | POA: Diagnosis not present

## 2015-10-27 DIAGNOSIS — Z48816 Encounter for surgical aftercare following surgery on the genitourinary system: Secondary | ICD-10-CM | POA: Diagnosis not present

## 2015-10-27 DIAGNOSIS — Z90710 Acquired absence of both cervix and uterus: Secondary | ICD-10-CM | POA: Diagnosis not present

## 2015-10-27 DIAGNOSIS — R102 Pelvic and perineal pain: Secondary | ICD-10-CM | POA: Diagnosis not present

## 2015-10-27 DIAGNOSIS — N3941 Urge incontinence: Secondary | ICD-10-CM | POA: Diagnosis not present

## 2015-11-22 ENCOUNTER — Ambulatory Visit: Payer: 59 | Attending: Obstetrics & Gynecology

## 2015-11-22 DIAGNOSIS — M5441 Lumbago with sciatica, right side: Secondary | ICD-10-CM | POA: Insufficient documentation

## 2015-11-22 MED FILL — XIIDRA 5% EYE DROPS: 5 | 30 days supply | Qty: 60 | Fill #1

## 2015-11-22 NOTE — Patient Instructions (Signed)
Issued from cabinet extension exercises  2-3 x/day, 5-10 press ups for 5-10 sec

## 2015-11-22 NOTE — Therapy (Signed)
[place], [place], [address] Phone: [phone]   Fax:  [phone]  Physical Therapy Evaluation  Patient Details  Name: Emily Calhoun MRN: [id number] Date of Birth: 01-07-1956 No Data Recorded  Encounter Date: 11/22/2015      PT End of Session - 11/22/15 1537    Visit Number 1   Number of Visits 8   Date for PT Re-Evaluation 12/20/15   Authorization Type UMR   PT Start Time 0130   PT Stop Time 0215   PT Time Calculation (min) 45 min   Activity Tolerance Patient tolerated treatment well   Behavior During Therapy [facility] for tasks assessed/performed      Past Medical History  Diagnosis Date  . Asthma   . Sweet's disease   . Autoimmune disease ([place])   . Migraine   . Thyroid disease     hypothyroid  . STD (sexually transmitted disease)     HSV II  . Migraine   . Anxiety   . History of kidney stones   . IBS (irritable bowel syndrome)   . Cervical cancer [facility])     mom took DES  . 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 Anal fissure   . Asthma   . Hyperlipidemia   . Hepatitis A     Past Surgical History  Procedure Laterality Date  . Nasal sinus surgery      x 2  . Abdominal hysterectomy  1988    ?retained ovaries for cx ca  . Internal hemorrhoidectomy    . Elbow surgery Right 1995    tendon repair  . Foot surgery Right   . Breast lumpectomy Left 2009    benign; ?cyst  . Breast reduction surgery Bilateral 09/2010  . Excision vaginal cyst  10-25-2009    benign squamous cyst  . Elbow surgery Right   . Blephoplasty      There were no vitals filed for this visit.       Subjective Assessment - 11/22/15 1340    Subjective She reports multiple falls ove r lifetime with a fracture to sacrum once.  She has worse pain with sitting in car and at timees need to pull over due to pain.  Last time no pain after surgery to remove a cyst on bladder for 3 weeks.  She reports pain resumed    probably when riding.  She needs to stretch and warm up  before riding horse. Fracture site always gives discomfort /pain.  MD feels muscles tight, need stretching.     Patient is accompained by: Family member   Limitations Sitting  Riding horse. riding in car   How long can you sit comfortably? 30 min to 45 min.        Ride horse    How long can you stand comfortably? 60 min or more   How long can you walk comfortably? 3 miles   Diagnostic tests None.   Last time she does not remember   Patient Stated Goals To improve riding by stretching muscles    Currently in Pain? Yes   Pain Score 4    Pain Location Back   Pain Orientation Right;Lower   Pain Descriptors / Indicators --  Nerve pain   Pain Type Chronic pain   Pain Radiating Towards RT lateral thigh   Pain Onset More than a month ago   Pain Frequency Constant   Aggravating Factors  sitting car and  horse   Pain Relieving Factors aleve   Multiple Pain Sites No  She has soem neck pain with RT arm symptoms at times            [facility] PT Assessment - 11/22/15 1339    Assessment   Medical Diagnosis LBP with scatica   Onset Date/Surgical Date --  years   Next MD Visit 2 months   Prior Therapy PT in past with some benefit   Precautions   Precautions None   Restrictions   Weight Bearing Restrictions No   Balance Screen   Has the patient fallen in the past 6 months No   Has the patient had a decrease in activity level because of a fear of falling?  No   Is the patient reluctant to leave their home because of a fear of falling?  No   Prior Function   Level of Independence Independent   Cognition   Overall Cognitive Status Within Functional Limits for tasks assessed   Posture/Postural Control   Posture Comments WNL   AROM   Overall AROM Comments Hip motion normal RT and LT   AROM Assessment Site Lumbar   Lumbar Flexion She can touch 3 inches from toes   Lumbar Extension 28   Lumbar - Right Side Bend 23   Lumbar - Left Side  Bend 22   Strength   Overall Strength Comments Normal LE strength bilaterally                           PT Education - 11/22/15 1532    Education provided Yes   Education Details POC ,extension   Person(s) Educated Patient   Methods Explanation;Demonstration;Tactile cues;Verbal cues;Handout   Comprehension Returned demonstration;Verbalized understanding             PT Long Term Goals - 11/22/15 1541    PT LONG TERM GOAL #1   Title She will be independent with all exercises issued   Time 4   Period Weeks   Status New   PT LONG TERM GOAL #2   Title She will report 75% decreased in RT thigh symptoms   Time 4   Period Weeks   Status New   PT LONG TERM GOAL #3   Title Seh will report able to sit in care for 45-60 min without increased pain.    Time 4   Period Weeks   Status New               Plan - 11/22/15 1538    Clinical Impression Statement [name] presents with complaint of RT LBP with radicular symptoms into RT thigh that appeared to decreased with extension stretching of lower back. . Sitting postures also increase symptoms more than standing and walking.   If she is benefited by extension exercises she should do well .She feel this problem may be related to pelvic issues as pain was resolved for 3 weeks after  removal of cyst from bladder earlier this year.    Rehab Potential Good   PT Frequency 2x / week   PT Duration 4 weeks   PT Treatment/Interventions Electrical Stimulation;Moist Heat;Cryotherapy;Iontophoresis 4mg /ml Dexamethasone;Ultrasound;Therapeutic exercise;Manual techniques;Taping;Passive range of motion;Patient/family education   PT Next Visit Plan Assess extension benefit and start core stabilization exercise. Modalities if needed   PT Home Exercise Plan extension exercises   Consulted and Agree with Plan of Care Patient      Patient will benefit from  skilled therapeutic intervention in order to improve the following  deficits and impairments:  Decreased activity tolerance, Pain, Increased muscle spasms  Visit Diagnosis: Right-sided low back pain with right-sided sciatica     Problem List Patient Active Problem List   Diagnosis Date Noted  . Chronic fatigue 07/24/2013  . Arthralgia 07/24/2013  . Depression 03/12/2013  . Insomnia 03/12/2013  . Rash 02/21/2011  . ALLERGIC RHINITIS 08/18/2010  . CERVICAL CANCER, HX OF 11/02/2009  . CERVICAL CANCER 10/26/2009  . HYPOTHYROIDISM 10/26/2009  . 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 SINUSITIS, RECURRENT 10/26/2009  . Asthma 10/26/2009  . LOW BACK PAIN, CHRONIC 10/26/2009  . HEMORRHOIDS, HX OF 10/26/2009  . PERSONAL HISTORY OF URINARY CALCULI 10/26/2009    Darrel Hoover PT 11/22/2015, 3:43 PM  [facility] [address] [place], [place], [address] Phone: [phone]   Fax:  [phone]  Name: KADASHIA KRIEGER MRN: [id number] Date of Birth: 06/03/1956

## 2015-11-29 ENCOUNTER — Ambulatory Visit: Payer: 59

## 2015-11-29 DIAGNOSIS — M5441 Lumbago with sciatica, right side: Secondary | ICD-10-CM | POA: Diagnosis not present

## 2015-11-29 NOTE — Therapy (Signed)
[place], [place], [address] Phone: [phone]   Fax:  [phone]  Physical Therapy Treatment  Patient Details  Name: Emily Calhoun MRN: [id number] Date of Birth: 08-Dec-1955 No Data Recorded  Encounter Date: 11/29/2015      PT End of Session - 11/29/15 1335    Visit Number 2   Number of Visits 8   Date for PT Re-Evaluation 12/20/15   PT Start Time [id number]   PT Stop Time 1423   PT Time Calculation (min) 48 min   Activity Tolerance Patient tolerated treatment well   Behavior During Therapy [facility] for tasks assessed/performed      Past Medical History  Diagnosis Date  . Asthma   . Sweet's disease   . Autoimmune disease ([place])   . Migraine   . Thyroid disease     hypothyroid  . STD (sexually transmitted disease)     HSV II  . Migraine   . Anxiety   . History of kidney stones   . IBS (irritable bowel syndrome)   . Cervical cancer [facility])     mom took DES  . 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 Anal fissure   . Asthma   . Hyperlipidemia   . Hepatitis A     Past Surgical History  Procedure Laterality Date  . Nasal sinus surgery      x 2  . Abdominal hysterectomy  1988    ?retained ovaries for cx ca  . Internal hemorrhoidectomy    . Elbow surgery Right 1995    tendon repair  . Foot surgery Right   . Breast lumpectomy Left 2009    benign; ?cyst  . Breast reduction surgery Bilateral 09/2010  . Excision vaginal cyst  10-25-2009    benign squamous cyst  . Elbow surgery Right   . Blephoplasty      There were no vitals filed for this visit.      Subjective Assessment - 11/29/15 1339    Subjective she reports increased RT arm symptoms to forearm to radial side. This has been going on for awile.   She has been active since last visit. I suggested she return to MD for her neck   Currently in Pain? Yes   Pain Score 4    Pain Location Back   Pain Orientation Lower;Right   Pain Type  Chronic pain   Multiple Pain Sites No                         [facility] Adult PT Treatment/Exercise - 11/29/15 0001    Modalities   Modalities Moist Heat;Ultrasound   Moist Heat Therapy   Number Minutes Moist Heat 12 Minutes   Moist Heat Location Lumbar Spine  and SI lying LT side   Ultrasound   Ultrasound Location RT SI are   Ultrasound Parameters 100% 1MHz, 1.5 Wcm2   Ultrasound Goals Pain   Manual Therapy   Manual Therapy Joint mobilization;Soft tissue mobilization   Manual therapy comments sdide lower lumbar spie   Joint Mobilization RT SI PA glide ans RT    Soft tissue mobilization with hands and tool to RT SI and Rt lower lumbar soft tissue and trigger pt release in this area                     PT Long Term Goals - 11/22/15 1541    PT  LONG TERM GOAL #1   Title She will be independent with all exercises issued   Time 4   Period Weeks   Status New   PT LONG TERM GOAL #2   Title She will report 75% decreased in RT thigh symptoms   Time 4   Period Weeks   Status New   PT LONG TERM GOAL #3   Title Seh will report able to sit in care for 45-60 min without increased pain.    Time 4   Period Weeks   Status New               Plan - 11/29/15 1443    Clinical Impression Statement She was better after session reports less pain. Second visit so no specific improvements. Neck and RT arm paoin is her primary concern and should be seen by MD as she feels this is worse   PT Next Visit Plan Continue modalities, start stabilization esercises   Consulted and Agree with Plan of Care Patient      Patient will benefit from skilled therapeutic intervention in order to improve the following deficits and impairments:  Decreased activity tolerance, Pain, Increased muscle spasms  Visit Diagnosis: Right-sided low back pain with right-sided sciatica     Problem List Patient Active Problem List   Diagnosis Date Noted  . Chronic fatigue 07/24/2013   . Arthralgia 07/24/2013  . Depression 03/12/2013  . Insomnia 03/12/2013  . Rash 02/21/2011  . ALLERGIC RHINITIS 08/18/2010  . CERVICAL CANCER, HX OF 11/02/2009  . CERVICAL CANCER 10/26/2009  . HYPOTHYROIDISM 10/26/2009  . 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 SINUSITIS, RECURRENT 10/26/2009  . Asthma 10/26/2009  . LOW BACK PAIN, CHRONIC 10/26/2009  . HEMORRHOIDS, HX OF 10/26/2009  . PERSONAL HISTORY OF URINARY CALCULI 10/26/2009    Darrel Hoover  PT 11/29/2015, 2:50 PM  [facility] [address] [place], [place], [address] Phone: [phone]   Fax:  [phone]  Name: RAFELITA BROSSMAN MRN: [id number] Date of Birth: May 21, 1956

## 2015-12-03 ENCOUNTER — Ambulatory Visit: Payer: 59

## 2015-12-03 DIAGNOSIS — M5441 Lumbago with sciatica, right side: Secondary | ICD-10-CM

## 2015-12-03 NOTE — Therapy (Signed)
[place], [place], [address] Phone: [phone]   Fax:  [phone]  Physical Therapy Treatment  Patient Details  Name: CHLOELYNN BISSET MRN: [id number] Date of Birth: [birth year] No Data Recorded  Encounter Date: 12/03/2015      PT End of Session - 12/03/15 1214    Visit Number 3   Number of Visits 8   Date for PT Re-Evaluation 12/20/15   PT Start Time [id number]   PT Stop Time 1104   PT Time Calculation (min) 49 min   Activity Tolerance Patient tolerated treatment well   Behavior During Therapy [facility] for tasks assessed/performed      Past Medical History  Diagnosis Date  . Asthma   . Sweet's disease   . Autoimmune disease ([place])   . Migraine   . Thyroid disease     hypothyroid  . STD (sexually transmitted disease)     HSV II  . Migraine   . Anxiety   . History of kidney stones   . IBS (irritable bowel syndrome)   . Cervical cancer [facility])     mom took DES  . 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 Anal fissure   . Asthma   . Hyperlipidemia   . Hepatitis A     Past Surgical History  Procedure Laterality Date  . Nasal sinus surgery      x 2  . Abdominal hysterectomy  1988    ?retained ovaries for cx ca  . Internal hemorrhoidectomy    . Elbow surgery Right 1995    tendon repair  . Foot surgery Right   . Breast lumpectomy Left 2009    benign; ?cyst  . Breast reduction surgery Bilateral 09/2010  . Excision vaginal cyst  10-25-2009    benign squamous cyst  . Elbow surgery Right   . Blephoplasty      There were no vitals filed for this visit.      Subjective Assessment - 12/03/15 1212    Subjective PT helping back . Arm and neck most concern. Did some riding without incr back pain    Currently in Pain? Yes   Pain Score 4    Pain Location Back   Pain Orientation Right;Lower   Pain Descriptors / Indicators --  nerve pain   Pain Type Chronic pain   Pain Onset More than a month  ago   Pain Frequency Constant   Multiple Pain Sites No                         [facility] Adult PT Treatment/Exercise - 12/03/15 1218    Moist Heat Therapy   Number Minutes Moist Heat 15 Minutes   Moist Heat Location Lumbar Spine   Ultrasound   Ultrasound Location 100%, 1MHz, 1.6 Wcm2   Ultrasound Parameters RT SI area   Ultrasound Goals Pain   Manual Therapy   Joint Mobilization RT SI PA glide and RT lower lumbar spine   Soft tissue mobilization with hands and tool to RT SI and Rt lower lumbar soft tissue and trigger pt release in this area                     PT Long Term Goals - 12/03/15 1216    PT LONG TERM GOAL #1   Title She will be independent with all exercises issued   Status On-going   PT LONG TERM  GOAL #2   Title She will report 75% decreased in RT thigh symptoms   Status On-going   PT LONG TERM GOAL #3   Title Seh will report able to sit in car for 45-60 min without increased pain.    Status On-going   PT LONG TERM GOAL #4   Title She will report able to ride horse for 45-60 min without incr back pain   Time 4   Period Weeks   Status New               Plan - 12/03/15 1214    Clinical Impression Statement She report she feels better post session. [place] so will see next Friday.  We will add some  stretching and stabilization exercise   PT Next Visit Plan Continue modalities, start stabilization exercises, manual, assess pelvic alignment   Consulted and Agree with Plan of Care Patient      Patient will benefit from skilled therapeutic intervention in order to improve the following deficits and impairments:  Decreased activity tolerance, Pain, Increased muscle spasms  Visit Diagnosis: Right-sided low back pain with right-sided sciatica     Problem List Patient Active Problem List   Diagnosis Date Noted  . Chronic fatigue 07/24/2013  . Arthralgia 07/24/2013  . Depression 03/12/2013  . Insomnia  03/12/2013  . Rash 02/21/2011  . ALLERGIC RHINITIS 08/18/2010  . CERVICAL CANCER, HX OF 11/02/2009  . CERVICAL CANCER 10/26/2009  . HYPOTHYROIDISM 10/26/2009  . 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 SINUSITIS, RECURRENT 10/26/2009  . Asthma 10/26/2009  . LOW BACK PAIN, CHRONIC 10/26/2009  . HEMORRHOIDS, HX OF 10/26/2009  . PERSONAL HISTORY OF URINARY CALCULI 10/26/2009    Darrel Hoover PT 12/03/2015, 12:21 PM  [facility] [address] [place], [place], [address] Phone: [phone]   Fax:  [phone]  Name: Emily Calhoun MRN: [id number] Date of Birth: [birth year]

## 2015-12-15 MED FILL — VENTOLIN HFA 90 MCG INHALER: 108 (90 BAS | 25 days supply | Qty: 18 | Fill #0

## 2015-12-16 MED FILL — ACYCLOVIR 400 MG TABLET: 400 | 90 days supply | Qty: 90 | Fill #3

## 2015-12-27 ENCOUNTER — Ambulatory Visit: Payer: 59 | Attending: Obstetrics & Gynecology

## 2015-12-27 DIAGNOSIS — M5441 Lumbago with sciatica, right side: Secondary | ICD-10-CM | POA: Insufficient documentation

## 2015-12-27 NOTE — Therapy (Signed)
[place], [place], [address] Phone: [phone]   Fax:  [phone]  Physical Therapy Treatment  Patient Details  Name: Emily Calhoun MRN: [id number] Date of Birth: 1956-02-05 No Data Recorded  Encounter Date: 12/27/2015      PT End of Session - 12/27/15 1316    Visit Number 4   Number of Visits 8   Date for PT Re-Evaluation 01/14/16   PT Start Time 1232   PT Stop Time 1325   PT Time Calculation (min) 53 min   Activity Tolerance Patient tolerated treatment well   Behavior During Therapy [facility] for tasks assessed/performed      Past Medical History  Diagnosis Date  . Asthma   . Sweet's disease   . Autoimmune disease ([place])   . Migraine   . Thyroid disease     hypothyroid  . STD (sexually transmitted disease)     HSV II  . Migraine   . Anxiety   . History of kidney stones   . IBS (irritable bowel syndrome)   . Cervical cancer [facility])     mom took DES  . 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 Anal fissure   . Asthma   . Hyperlipidemia   . Hepatitis A     Past Surgical History  Procedure Laterality Date  . Nasal sinus surgery      x 2  . Abdominal hysterectomy  1988    ?retained ovaries for cx ca  . Internal hemorrhoidectomy    . Elbow surgery Right 1995    tendon repair  . Foot surgery Right   . Breast lumpectomy Left 2009    benign; ?cyst  . Breast reduction surgery Bilateral 09/2010  . Excision vaginal cyst  10-25-2009    benign squamous cyst  . Elbow surgery Right   . Blephoplasty      There were no vitals filed for this visit.      Subjective Assessment - 12/27/15 1236    Subjective Less sciatica in RT leg and less arm pain butr have been on vacation. No neck and RT shoulder pain. now.    Competing in horse contest so expect to have pain .    Currently in Pain? Yes   Pain Score 4    Pain Location Back   Pain Orientation Right   Pain Descriptors / Indicators  Tightness   Pain Type Chronic pain   Pain Radiating Towards RT thigh    Multiple Pain Sites No            [facility] PT Assessment - 12/27/15 0001    AROM   Lumbar Flexion she can touch her toes.    Lumbar Extension 32   Lumbar - Right Side Bend 25   Lumbar - Left Side Bend 25   Palpation   SI assessment  pelvis andsacrum appear level   Palpation comment tender RT gluteals                     [facility] Adult PT Treatment/Exercise - 12/27/15 1310    Moist Heat Therapy   Number Minutes Moist Heat 15 Minutes   Moist Heat Location Lumbar Spine  and RT buttock   Ultrasound   Ultrasound Location Rt gluteals and lower lumbar paraspinals   Ultrasound Parameters 100% !MHz 1.6 Wcm2   Ultrasound Goals Pain   Manual Therapy   Manual therapy comments RT hip rotation with soft  tissue pressure to RT gluteals   Joint Mobilization RT SI PA glide and RT lower lumbar spine   Soft tissue mobilization with hands and tool to RT SI and Rt lower lumbar soft tissue and trigger pt release in this area                     PT Long Term Goals - 12/27/15 1240    PT LONG TERM GOAL #1   Title She will be independent with all exercises issued   Status On-going   PT LONG TERM GOAL #2   Title She will report 75% decreased in RT thigh symptoms   Baseline 20%   Status On-going   PT LONG TERM GOAL #3   Title Seh will report able to sit in car for 45-60 min without increased pain.    Status On-going   PT LONG TERM GOAL #4   Title She will report able to ride horse for 45-60 min without incr back pain   Baseline she will be riding this weekend   Status On-going               Plan - 12/27/15 1317    Clinical Impression Statement She reports she is better but pain number unchanged though she reports less leg symptoms and no arm symptoms today. She rode her horse 4 hours without increased pain today. She is tender in gluteals . SI Jones Broom Jerene Pitch all looked level. Contnue 4  visits then assess possible discharge   PT Treatment/Interventions Electrical Stimulation;Moist Heat;Cryotherapy;Iontophoresis 4mg /ml Dexamethasone;Ultrasound;Therapeutic exercise;Manual techniques;Taping;Passive range of motion;Patient/family education;Dry needling   PT Next Visit Plan Continue modalities, start stabilization exercises, manual, assess pelvic alignment, possible dry needle   Consulted and Agree with Plan of Care Patient      Patient will benefit from skilled therapeutic intervention in order to improve the following deficits and impairments:  Decreased activity tolerance, Pain, Increased muscle spasms  Visit Diagnosis: Right-sided low back pain with right-sided sciatica     Problem List Patient Active Problem List   Diagnosis Date Noted  . Chronic fatigue 07/24/2013  . Arthralgia 07/24/2013  . Depression 03/12/2013  . Insomnia 03/12/2013  . Rash 02/21/2011  . ALLERGIC RHINITIS 08/18/2010  . CERVICAL CANCER, HX OF 11/02/2009  . CERVICAL CANCER 10/26/2009  . HYPOTHYROIDISM 10/26/2009  . 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 SINUSITIS, RECURRENT 10/26/2009  . Asthma 10/26/2009  . LOW BACK PAIN, CHRONIC 10/26/2009  . HEMORRHOIDS, HX OF 10/26/2009  . PERSONAL HISTORY OF URINARY CALCULI 10/26/2009    Darrel Hoover PT 12/27/2015, 1:23 PM  [facility] [address] [place], [place], [address] Phone: [phone]   Fax:  [phone]  Name: Emily Calhoun MRN: [id number] Date of Birth: Feb 10, 1956

## 2016-01-03 ENCOUNTER — Ambulatory Visit: Payer: 59 | Admitting: Physical Therapy

## 2016-01-03 DIAGNOSIS — M5441 Lumbago with sciatica, right side: Secondary | ICD-10-CM

## 2016-01-03 NOTE — Therapy (Signed)
[place], [place], [address] Phone: [phone]   Fax:  [phone]  Physical Therapy Treatment  Patient Details  Name: Emily Calhoun MRN: [id number] Date of Birth: 05-06-1956 No Data Recorded  Encounter Date: 01/03/2016      PT End of Session - 01/03/16 1541    Visit Number 5   Number of Visits 8   Date for PT Re-Evaluation 01/14/16   Authorization Type UMR   PT Start Time 1508   Activity Tolerance Patient tolerated treatment well   Behavior During Therapy [facility] for tasks assessed/performed      Past Medical History  Diagnosis Date  . Asthma   . Sweet's disease   . Autoimmune disease ([place])   . Migraine   . Thyroid disease     hypothyroid  . STD (sexually transmitted disease)     HSV II  . Migraine   . Anxiety   . History of kidney stones   . IBS (irritable bowel syndrome)   . Cervical cancer [facility])     mom took DES  . 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 Anal fissure   . Asthma   . Hyperlipidemia   . Hepatitis A     Past Surgical History  Procedure Laterality Date  . Nasal sinus surgery      x 2  . Abdominal hysterectomy  1988    ?retained ovaries for cx ca  . Internal hemorrhoidectomy    . Elbow surgery Right 1995    tendon repair  . Foot surgery Right   . Breast lumpectomy Left 2009    benign; ?cyst  . Breast reduction surgery Bilateral 09/2010  . Excision vaginal cyst  10-25-2009    benign squamous cyst  . Elbow surgery Right   . Blephoplasty      There were no vitals filed for this visit.      Subjective Assessment - 01/03/16 1513    Subjective "I am feeling pretty sore and exhausted from doing a competition"    Currently in Pain? Yes   Pain Score 4    Pain Location Back   Pain Orientation Right   Pain Descriptors / Indicators Tightness   Pain Type Chronic pain   Aggravating Factors  sitting in the car and riding the hourse   Pain Relieving Factors  aleve                         [facility] Adult PT Treatment/Exercise - 01/03/16 1554    Knee/Hip Exercises: Stretches   Piriformis Stretch 2 reps;30 seconds          Trigger Point Dry Needling - 01/03/16 1522    Consent Given? Yes   Education Handout Provided Yes   Muscles Treated Upper Body Longissimus;Piriformis   Muscles Treated Lower Body Piriformis   Longissimus Response Twitch response elicited;Palpable increased muscle length  R lumbar multifidus at L4-L5   Piriformis Response Twitch response elicited;Palpable increased muscle length  R x 2               PT Education - 01/03/16 1541    Education provided Yes   Education Details dry needling education benefits, what to expect and after care.    Person(s) Educated Patient   Methods Explanation;Verbal cues   Comprehension Verbalized understanding             PT Long Term Goals - 12/27/15 1240  PT LONG TERM GOAL #1   Title She will be independent with all exercises issued   Status On-going   PT LONG TERM GOAL #2   Title She will report 75% decreased in RT thigh symptoms   Baseline 20%   Status On-going   PT LONG TERM GOAL #3   Title Seh will report able to sit in car for 45-60 min without increased pain.    Status On-going   PT LONG TERM GOAL #4   Title She will report able to ride horse for 45-60 min without incr back pain   Baseline she will be riding this weekend   Status On-going               Plan - 01/03/16 1555    Clinical Impression Statement Mrs. Dunker provided consent for DN of the lumbar spine on the R and the R piriformis; pt was monitored throughout treatment. follwing she reported relieve of tension with IASTM and myofascial release. utilized MHP post session to calm down soreness post session.    PT Next Visit Plan assess resonse to DN, Continue modalities, start stabilization exercises, manual, assess pelvic alignment, possible dry needle   Consulted and Agree  with Plan of Care Patient      Patient will benefit from skilled therapeutic intervention in order to improve the following deficits and impairments:  Decreased activity tolerance, Pain, Increased muscle spasms  Visit Diagnosis: Right-sided low back pain with right-sided sciatica     Problem List Patient Active Problem List   Diagnosis Date Noted  . Chronic fatigue 07/24/2013  . Arthralgia 07/24/2013  . Depression 03/12/2013  . Insomnia 03/12/2013  . Rash 02/21/2011  . ALLERGIC RHINITIS 08/18/2010  . CERVICAL CANCER, HX OF 11/02/2009  . CERVICAL CANCER 10/26/2009  . HYPOTHYROIDISM 10/26/2009  . 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 SINUSITIS, RECURRENT 10/26/2009  . Asthma 10/26/2009  . LOW BACK PAIN, CHRONIC 10/26/2009  . HEMORRHOIDS, HX OF 10/26/2009  . PERSONAL HISTORY OF URINARY CALCULI 10/26/2009   Starr Lake PT, DPT, LAT, ATC  01/03/2016  4:01 PM      [facility] [address] [place], [place], [address] Phone: [phone]   Fax:  [phone]  Name: Emily Calhoun MRN: [id number] Date of Birth: [birth year]

## 2016-01-05 ENCOUNTER — Ambulatory Visit: Payer: 59 | Admitting: Physical Therapy

## 2016-01-05 DIAGNOSIS — M5441 Lumbago with sciatica, right side: Secondary | ICD-10-CM

## 2016-01-05 NOTE — Therapy (Signed)
[place], [place], [address] Phone: [phone]   Fax:  [phone]  Physical Therapy Treatment  Patient Details  Name: Emily Calhoun MRN: [id number] Date of Birth: 02-11-1956 No Data Recorded  Encounter Date: 01/05/2016      PT End of Session - 01/05/16 0932    Visit Number 6   Number of Visits 8   Date for PT Re-Evaluation 01/14/16   Authorization Type UMR   PT Start Time 0845   PT Stop Time 0937   PT Time Calculation (min) 52 min   Activity Tolerance Patient tolerated treatment well   Behavior During Therapy [facility] for tasks assessed/performed      Past Medical History  Diagnosis Date  . Asthma   . Sweet's disease   . Autoimmune disease ([place])   . Migraine   . Thyroid disease     hypothyroid  . STD (sexually transmitted disease)     HSV II  . Migraine   . Anxiety   . History of kidney stones   . IBS (irritable bowel syndrome)   . Cervical cancer [facility])     mom took DES  . 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 Anal fissure   . Asthma   . Hyperlipidemia   . Hepatitis A     Past Surgical History  Procedure Laterality Date  . Nasal sinus surgery      x 2  . Abdominal hysterectomy  1988    ?retained ovaries for cx ca  . Internal hemorrhoidectomy    . Elbow surgery Right 1995    tendon repair  . Foot surgery Right   . Breast lumpectomy Left 2009    benign; ?cyst  . Breast reduction surgery Bilateral 09/2010  . Excision vaginal cyst  10-25-2009    benign squamous cyst  . Elbow surgery Right   . Blephoplasty      There were no vitals filed for this visit.      Subjective Assessment - 01/05/16 0851    Subjective "I feel like the dry needling helped, I am able to touch it without it being as tender" pt reports some soreness more in the Low back "   Currently in Pain? Yes   Pain Score 3    Pain Location Back   Pain Orientation Right   Pain Descriptors / Indicators  Tightness   Pain Type Chronic pain   Pain Onset More than a month ago   Pain Frequency Intermittent                         [facility] Adult PT Treatment/Exercise - 01/05/16 0001    Lumbar Exercises: Stretches   Lower Trunk Rotation 2 reps;30 seconds   Prone Mid Back Stretch 3 reps;30 seconds  childs pose stretch with L/R bending   Moist Heat Therapy   Number Minutes Moist Heat 10 Minutes   Moist Heat Location Lumbar Spine;Other (comment)  supine   Manual Therapy   Joint Mobilization L1- L5 Grade 2 mobs P>A  to improve mobility and decrease pain   Soft tissue mobilization IASTM over R L4-L5 multifidus and R pirformis   Myofascial Release fascial stretching/ rolling of the R lumbar paraspinals and R piriformis          Trigger Point Dry Needling - 01/05/16 0904    Consent Given? Yes   Education Handout Provided No  given previously  Muscles Treated Upper Body Longissimus   Longissimus Response Twitch response elicited;Palpable increased muscle length  bil at L3-L5              PT Education - 01/05/16 0932    Education provided Yes   Education Details anatomy of the lumbar spine in relation the muscles that were being needling and where they can refer pain to.    Person(s) Educated Patient   Methods Explanation   Comprehension Verbalized understanding             PT Long Term Goals - 12/27/15 1240    PT LONG TERM GOAL #1   Title She will be independent with all exercises issued   Status On-going   PT LONG TERM GOAL #2   Title She will report 75% decreased in RT thigh symptoms   Baseline 20%   Status On-going   PT LONG TERM GOAL #3   Title Seh will report able to sit in car for 45-60 min without increased pain.    Status On-going   PT LONG TERM GOAL #4   Title She will report able to ride horse for 45-60 min without incr back pain   Baseline she will be riding this weekend   Status On-going               Plan - 01/05/16 0932     Clinical Impression Statement Mrs. Hurlbutt reports relief since the last DN btu states the L low back is sore today as well. DN was performed on bil lumbar multifidus x 2; pt was monitored during treatment. performed IASTM and myofascial release which she reported reliief of sorness but still had soreness from DN utilized MHP post session to calm down soreness.    PT Next Visit Plan assess resonse to DN, Continue modalities, start stabilization exercises, manual, assess pelvic alignment,   Consulted and Agree with Plan of Care Patient      Patient will benefit from skilled therapeutic intervention in order to improve the following deficits and impairments:  Decreased activity tolerance, Pain, Increased muscle spasms  Visit Diagnosis: Right-sided low back pain with right-sided sciatica     Problem List Patient Active Problem List   Diagnosis Date Noted  . Chronic fatigue 07/24/2013  . Arthralgia 07/24/2013  . Depression 03/12/2013  . Insomnia 03/12/2013  . Rash 02/21/2011  . ALLERGIC RHINITIS 08/18/2010  . CERVICAL CANCER, HX OF 11/02/2009  . CERVICAL CANCER 10/26/2009  . HYPOTHYROIDISM 10/26/2009  . 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 SINUSITIS, RECURRENT 10/26/2009  . Asthma 10/26/2009  . LOW BACK PAIN, CHRONIC 10/26/2009  . HEMORRHOIDS, HX OF 10/26/2009  . PERSONAL HISTORY OF URINARY CALCULI 10/26/2009   Starr Lake PT, DPT, LAT, ATC  01/05/2016  9:40 AM      [facility] [address] [place], [place], [address] Phone: [phone]   Fax:  [phone]  Name: TRISHNA KARRICK MRN: [id number] Date of Birth: 24-Dec-1955

## 2016-01-06 MED FILL — ESTRADIOL 0.075 MG PATCH: 0.075 | 84 days supply | Qty: 24 | Fill #2

## 2016-01-06 MED FILL — ESCITALOPRAM 20 MG TABLET: 20 | 90 days supply | Qty: 90 | Fill #2

## 2016-01-06 MED FILL — ESOMEPRAZOLE MAG DR 40 MG C: 40 | 30 days supply | Qty: 30 | Fill #1

## 2016-01-06 MED FILL — LEVOTHYROXINE 75 MCG TABLET: 75 | 90 days supply | Qty: 90 | Fill #2

## 2016-01-06 MED FILL — XIIDRA 5% EYE DROPS: 5 | 30 days supply | Qty: 60 | Fill #2

## 2016-01-07 MED FILL — clonazePAM 1 MG TABS: 1 | 30 days supply | Qty: 30 | Fill #0

## 2016-01-11 DIAGNOSIS — M549 Dorsalgia, unspecified: Secondary | ICD-10-CM | POA: Diagnosis not present

## 2016-01-11 DIAGNOSIS — E039 Hypothyroidism, unspecified: Secondary | ICD-10-CM | POA: Diagnosis not present

## 2016-01-11 DIAGNOSIS — Z6827 Body mass index (BMI) 27.0-27.9, adult: Secondary | ICD-10-CM | POA: Diagnosis not present

## 2016-01-11 DIAGNOSIS — G4700 Insomnia, unspecified: Secondary | ICD-10-CM | POA: Diagnosis not present

## 2016-01-12 ENCOUNTER — Ambulatory Visit: Payer: 59 | Admitting: Physical Therapy

## 2016-01-12 DIAGNOSIS — M5441 Lumbago with sciatica, right side: Secondary | ICD-10-CM | POA: Diagnosis not present

## 2016-01-12 NOTE — Therapy (Signed)
[place], [place], [address] Phone: [phone]   Fax:  [phone]  Physical Therapy Treatment  Patient Details  Name: Emily Calhoun MRN: [id number] Date of Birth: February 12, 1956 No Data Recorded  Encounter Date: 01/12/2016      PT End of Session - 01/12/16 1554    Visit Number 7   Number of Visits 8   Date for PT Re-Evaluation 01/14/16   Authorization Type UMR   PT Start Time 1505   PT Stop Time 1548   PT Time Calculation (min) 43 min   Activity Tolerance Patient tolerated treatment well   Behavior During Therapy [facility] for tasks assessed/performed      Past Medical History  Diagnosis Date  . Asthma   . Sweet's disease   . Autoimmune disease ([place])   . Migraine   . Thyroid disease     hypothyroid  . STD (sexually transmitted disease)     HSV II  . Migraine   . Anxiety   . History of kidney stones   . IBS (irritable bowel syndrome)   . Cervical cancer [facility])     mom took DES  . 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 Anal fissure   . Asthma   . Hyperlipidemia   . Hepatitis A     Past Surgical History  Procedure Laterality Date  . Nasal sinus surgery      x 2  . Abdominal hysterectomy  1988    ?retained ovaries for cx ca  . Internal hemorrhoidectomy    . Elbow surgery Right 1995    tendon repair  . Foot surgery Right   . Breast lumpectomy Left 2009    benign; ?cyst  . Breast reduction surgery Bilateral 09/2010  . Excision vaginal cyst  10-25-2009    benign squamous cyst  . Elbow surgery Right   . Blephoplasty      There were no vitals filed for this visit.      Subjective Assessment - 01/12/16 1512    Subjective "Today I am feeling pretty sore rated at a 6/10, more on the R side"    Currently in Pain? Yes   Pain Score 6    Pain Location Back   Pain Orientation Right   Pain Descriptors / Indicators Aching;Tightness   Pain Type Chronic pain   Pain Onset More than a  month ago   Pain Frequency Intermittent   Aggravating Factors  sitting in the care and riding on the horse   Pain Relieving Factors aleve, MHP                         [facility] Adult PT Treatment/Exercise - 01/12/16 0001    Lumbar Exercises: Supine   Straight Leg Raise 10 reps  with quad set x 2 sets   Manual Therapy   Manual Therapy Muscle Energy Technique   Joint Mobilization L1- L5 Grade 2 mobs P>A   Myofascial Release fascial stretching/ rolling of the R lumbar paraspinals and R piriformis   Muscle Energy Technique L sidelying with R anterior mobs on R innominate with resisted hip flexion 5 x 10 sec hodl                PT Education - 01/12/16 1553    Education provided Yes   Education Details mechancis of the SIJ and effects of the muscles. updated HEP to work on decreasing posterior  rotation of the R SIJ.    Person(s) Educated Patient   Methods Explanation;Demonstration;Verbal cues   Comprehension Verbalized understanding;Verbal cues required             PT Long Term Goals - 01/12/16 1601    PT LONG TERM GOAL #1   Title She will be independent with all exercises issued   Time 4   Period Weeks   Status On-going   PT LONG TERM GOAL #2   Title She will report 75% decreased in RT thigh symptoms   Time 4   Period Weeks   Status On-going   PT LONG TERM GOAL #3   Title Seh will report able to sit in car for 45-60 min without increased pain.    Time 4   Period Weeks   Status On-going   PT LONG TERM GOAL #4   Title She will report able to ride horse for 45-60 min without incr back pain   Time 4   Period Weeks   Status On-going               Plan - 01/12/16 1555    Clinical Impression Statement Mrs. Klosowski states she has increased soreness reated at 6/10. upon further assessment pt demonstrates R posterior innominate rotation. Following L sidelying R anterior mobilization combined with resisted R hip flexion MET she reported pain dropped  to 4/ 10. pt declined modalites post session. plan to re-cert next session for a couple more visits to work toward remaining goals.    PT Next Visit Plan Re-certification,  Continue modalities, start stabilization exercises, manual, assess pelvic alignment, MET for R posterior rotation, DN PRN,    PT Home Exercise Plan SLR   Consulted and Agree with Plan of Care Patient      Patient will benefit from skilled therapeutic intervention in order to improve the following deficits and impairments:  Decreased activity tolerance, Pain, Increased muscle spasms  Visit Diagnosis: Right-sided low back pain with right-sided sciatica     Problem List Patient Active Problem List   Diagnosis Date Noted  . Chronic fatigue 07/24/2013  . Arthralgia 07/24/2013  . Depression 03/12/2013  . Insomnia 03/12/2013  . Rash 02/21/2011  . ALLERGIC RHINITIS 08/18/2010  . CERVICAL CANCER, HX OF 11/02/2009  . CERVICAL CANCER 10/26/2009  . HYPOTHYROIDISM 10/26/2009  . 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 SINUSITIS, RECURRENT 10/26/2009  . Asthma 10/26/2009  . LOW BACK PAIN, CHRONIC 10/26/2009  . HEMORRHOIDS, HX OF 10/26/2009  . PERSONAL HISTORY OF URINARY CALCULI 10/26/2009   Starr Lake PT, DPT, LAT, ATC  01/12/2016  4:35 PM      [facility] [address] [place], [place], [address] Phone: [phone]   Fax:  [phone]  Name: Emily Calhoun MRN: [id number] Date of Birth: August 28, 1955

## 2016-01-14 ENCOUNTER — Encounter: Payer: 59 | Admitting: Physical Therapy

## 2016-01-24 ENCOUNTER — Ambulatory Visit: Payer: 59 | Attending: Obstetrics & Gynecology

## 2016-01-24 DIAGNOSIS — M5441 Lumbago with sciatica, right side: Secondary | ICD-10-CM | POA: Insufficient documentation

## 2016-01-24 NOTE — Therapy (Signed)
[place], [place], [address] Phone: [phone]   Fax:  [phone]  Physical Therapy Treatment  Patient Details  Name: Emily Calhoun MRN: [id number] Date of Birth: 26-Feb-1956 No Data Recorded  Encounter Date: 01/24/2016      PT End of Session - 01/24/16 1414    Visit Number 8   Number of Visits 14   Date for PT Re-Evaluation 02/18/16   PT Start Time 0145  late 15 min   PT Stop Time 0228   PT Time Calculation (min) 43 min   Activity Tolerance Patient tolerated treatment well   Behavior During Therapy [facility] for tasks assessed/performed      Past Medical History  Diagnosis Date  . Asthma   . Sweet's disease   . Autoimmune disease ([place])   . Migraine   . Thyroid disease     hypothyroid  . STD (sexually transmitted disease)     HSV II  . Migraine   . Anxiety   . History of kidney stones   . IBS (irritable bowel syndrome)   . Cervical cancer [facility])     mom took DES  . 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 Anal fissure   . Asthma   . Hyperlipidemia   . Hepatitis A     Past Surgical History  Procedure Laterality Date  . Nasal sinus surgery      x 2  . Abdominal hysterectomy  1988    ?retained ovaries for cx ca  . Internal hemorrhoidectomy    . Elbow surgery Right 1995    tendon repair  . Foot surgery Right   . Breast lumpectomy Left 2009    benign; ?cyst  . Breast reduction surgery Bilateral 09/2010  . Excision vaginal cyst  10-25-2009    benign squamous cyst  . Elbow surgery Right   . Blephoplasty      There were no vitals filed for this visit.      Subjective Assessment - 01/24/16 1347    Subjective Today not doing well due to fall last thursday. Now with pain Lt buttock and middle of back. She did much icing and some stretching.   I rode today and whacked my head. Not sure what happened when horse jumped  object . She has not been to MD.     Currently in Pain? Yes   Pain Score 6    Pain Location Back   Pain Orientation Right;Left;Lower;Posterior   Pain Descriptors / Indicators Tightness;Aching   Pain Type Acute pain;Chronic pain   Pain Radiating Towards RT Anterior hip    Pain Frequency Constant   Aggravating Factors  sitting still   Pain Relieving Factors ice , tylenol    Multiple Pain Sites Yes            [facility] PT Assessment - 01/24/16 1353    AROM   Overall AROM Comments Hip motion normal RT and LT   Lumbar Flexion DShe can touch her toes   Lumbar Extension 30   Lumbar - Right Side Bend 30   Lumbar - Left Side Bend 30   Strength   Overall Strength Comments Normal LE strength bilaterally   Ambulation/Gait   Gait Comments Normal                     [facility] Adult PT Treatment/Exercise - 01/24/16 1353    Lumbar Exercises: Stretches   Single Knee to  Chest Stretch 2 reps;30 seconds   Single Knee to Chest Stretch Limitations opposite leg on mat   Double Knee to Chest Stretch 2 reps;30 seconds   Modalities   Modalities Electrical Stimulation   Electrical Stimulation   Electrical Stimulation Location IFC Lower back    Electrical Stimulation Action lower back   Electrical Stimulation Parameters L14   Electrical Stimulation Goals Pain   Manual Therapy   Joint Mobilization L1- L5 Grade 3 mobs P>A   Soft tissue mobilization STW to paraspinals and quatrus and gluteals/piriformis RT and LT                     PT Long Term Goals - 01/24/16 1416    PT LONG TERM GOAL #1   Title She will be independent with all exercises issued   Status On-going   PT LONG TERM GOAL #2   Title She will report 75% decreased in RT thigh symptoms   Status On-going   PT LONG TERM GOAL #3   Title Seh will report able to sit in car for 45-60 min without increased pain.    Status On-going   PT LONG TERM GOAL #4   Title She will report able to ride horse for 45-60 min without incr back pain   Status On-going               Plan  - 01/24/16 1415    Clinical Impression Statement [name] returns today reporting fall off of horse with bilateral back pain and pain into anterior hip. She also report pain in elbow and shoulder .   If she continues to fall there is not much we can do for her but she will continue to ride horses an dwill always be at risk for fall. She did not have any limits in regards to ROm and soft tissue felt suple and she did not flich with pressure and PA mobs. .  Pelvic levels all felt even. IFC she reports felt good. Need to assess if this is better with Electric stim   PT Frequency 2x / week   PT Duration 3 weeks   PT Next Visit Plan DN, modalites, stretching , assess elecric stim benerfits   Consulted and Agree with Plan of Care Patient      Patient will benefit from skilled therapeutic intervention in order to improve the following deficits and impairments:  Decreased activity tolerance, Pain, Increased muscle spasms  Visit Diagnosis: Bilateral low back pain with right-sided sciatica - Plan: PT plan of care cert/re-cert     Problem List Patient Active Problem List   Diagnosis Date Noted  . Chronic fatigue 07/24/2013  . Arthralgia 07/24/2013  . Depression 03/12/2013  . Insomnia 03/12/2013  . Rash 02/21/2011  . ALLERGIC RHINITIS 08/18/2010  . CERVICAL CANCER, HX OF 11/02/2009  . CERVICAL CANCER 10/26/2009  . HYPOTHYROIDISM 10/26/2009  . 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 SINUSITIS, RECURRENT 10/26/2009  . Asthma 10/26/2009  . LOW BACK PAIN, CHRONIC 10/26/2009  . HEMORRHOIDS, HX OF 10/26/2009  . PERSONAL HISTORY OF URINARY CALCULI 10/26/2009    Darrel Hoover  PT 01/24/2016, 3:05 PM  [facility] [address] [place], [place], [address] Phone: [phone]   Fax:  [phone]  Name: Emily Calhoun MRN: [id number] Date of Birth: 11/10/1955

## 2016-01-26 DIAGNOSIS — H2513 Age-related nuclear cataract, bilateral: Secondary | ICD-10-CM | POA: Diagnosis not present

## 2016-01-26 DIAGNOSIS — H40023 Open angle with borderline findings, high risk, bilateral: Secondary | ICD-10-CM | POA: Diagnosis not present

## 2016-01-26 DIAGNOSIS — H04123 Dry eye syndrome of bilateral lacrimal glands: Secondary | ICD-10-CM | POA: Diagnosis not present

## 2016-01-27 ENCOUNTER — Ambulatory Visit: Payer: 59 | Admitting: Physical Therapy

## 2016-01-27 DIAGNOSIS — M5441 Lumbago with sciatica, right side: Secondary | ICD-10-CM | POA: Diagnosis not present

## 2016-01-27 NOTE — Therapy (Signed)
[place], [place], [address] Phone: [phone]   Fax:  [phone]  Physical Therapy Treatment  Patient Details  Name: Emily Calhoun MRN: [id number] Date of Birth: [birth year] No Data Recorded  Encounter Date: 01/27/2016      PT End of Session - 01/27/16 1634    Visit Number 9   Number of Visits 14   Date for PT Re-Evaluation 02/18/16   PT Start Time 3546   PT Stop Time 1633   PT Time Calculation (min) 48 min   Activity Tolerance Patient tolerated treatment well   Behavior During Therapy [facility] for tasks assessed/performed      Past Medical History  Diagnosis Date  . Asthma   . Sweet's disease   . Autoimmune disease ([place])   . Migraine   . Thyroid disease     hypothyroid  . STD (sexually transmitted disease)     HSV II  . Migraine   . Anxiety   . History of kidney stones   . IBS (irritable bowel syndrome)   . Cervical cancer [facility])     mom took DES  . 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 Anal fissure   . Asthma   . Hyperlipidemia   . Hepatitis A     Past Surgical History  Procedure Laterality Date  . Nasal sinus surgery      x 2  . Abdominal hysterectomy  1988    ?retained ovaries for cx ca  . Internal hemorrhoidectomy    . Elbow surgery Right 1995    tendon repair  . Foot surgery Right   . Breast lumpectomy Left 2009    benign; ?cyst  . Breast reduction surgery Bilateral 09/2010  . Excision vaginal cyst  10-25-2009    benign squamous cyst  . Elbow surgery Right   . Blephoplasty      There were no vitals filed for this visit.      Subjective Assessment - 01/27/16 1543    Subjective "I am doing better today since the last session, it has calmed down alot"    Currently in Pain? Yes   Pain Score 5    Pain Location Back   Pain Descriptors / Indicators Sore   Pain Type Chronic pain   Pain Onset More than a month ago                         [facility]  Adult PT Treatment/Exercise - 01/27/16 0001    Lumbar Exercises: Stretches   Prone Mid Back Stretch 2 reps;30 seconds  childs pose stretch 2 x 30 sec hold   Lumbar Exercises: Seated   Hip Flexion on Ball AROM;Strengthening;Both;10 reps  x 3 sets with verbal cues to keep core tight   Lumbar Exercises: Supine   Other Supine Lumbar Exercises dead bug 3 x 10 with static hold, 2 x 10 with alternating arm/ leg extension  verbal cues to keep core tight   Lumbar Exercises: Quadruped   [place]cat/Old Horse 10 reps  holding 5 sec each rep   Ultrasound   Ultrasound Location R SIJ    Ultrasound Parameters cont L 1.5 w/cm2 x 8 min   Ultrasound Goals Pain   Manual Therapy   Muscle Energy Technique prone resisted hip flexion on R with grade 3 L innominate anter mob with 5 x 10 sec hold  PT Education - 01/27/16 1633    Education provided Yes   Education Details updated HEP for core strengthening with progression with porper form and treatment rationale   Person(s) Educated Patient   Methods Explanation;Handout;Verbal cues   Comprehension Verbalized understanding             PT Long Term Goals - 01/24/16 1416    PT LONG TERM GOAL #1   Title She will be independent with all exercises issued   Status On-going   PT LONG TERM GOAL #2   Title She will report 75% decreased in RT thigh symptoms   Status On-going   PT LONG TERM GOAL #3   Title Seh will report able to sit in car for 45-60 min without increased pain.    Status On-going   PT LONG TERM GOAL #4   Title She will report able to ride horse for 45-60 min without incr back pain   Status On-going               Plan - 01/27/16 1634    Clinical Impression Statement Ms. Debarr reported 5/10 pain initally in the R posterior hip. follwoing core strengthening and hip MET with anterior innominate mob she rpeorted decreased pain. updated HEP for core strengtheing with focus on utilinzing hip flexors to conitnue  strengthenign which pt demosntrated understanding of.    PT Next Visit Plan DN, modalites, stretching , assess elecric stim benerfits, hip flexor strengthening, core strengthening, assess reponse to [name]   PT Home Exercise Plan dead bug with progression, cat/ cow   Consulted and Agree with Plan of Care Patient      Patient will benefit from skilled therapeutic intervention in order to improve the following deficits and impairments:  Decreased activity tolerance, Pain, Increased muscle spasms  Visit Diagnosis: Bilateral low back pain with right-sided sciatica  Right-sided low back pain with right-sided sciatica     Problem List Patient Active Problem List   Diagnosis Date Noted  . Chronic fatigue 07/24/2013  . Arthralgia 07/24/2013  . Depression 03/12/2013  . Insomnia 03/12/2013  . Rash 02/21/2011  . ALLERGIC RHINITIS 08/18/2010  . CERVICAL CANCER, HX OF 11/02/2009  . CERVICAL CANCER 10/26/2009  . HYPOTHYROIDISM 10/26/2009  . 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 SINUSITIS, RECURRENT 10/26/2009  . Asthma 10/26/2009  . LOW BACK PAIN, CHRONIC 10/26/2009  . HEMORRHOIDS, HX OF 10/26/2009  . PERSONAL HISTORY OF URINARY CALCULI 10/26/2009   Starr Lake PT, DPT, LAT, ATC  01/27/2016  4:44 PM      [facility] [address] [place], [place], [address] Phone: [phone]   Fax:  [phone]  Name: Emily Calhoun MRN: [id number] Date of Birth: 02-17-1956

## 2016-01-31 ENCOUNTER — Ambulatory Visit: Payer: 59 | Admitting: Physical Therapy

## 2016-01-31 DIAGNOSIS — M5441 Lumbago with sciatica, right side: Secondary | ICD-10-CM

## 2016-01-31 NOTE — Therapy (Addendum)
[place], [place], [address] Phone: [phone]   Fax:  [phone]  Physical Therapy Treatment / Discharge Note  Patient Details  Name: Emily Calhoun MRN: [id number] Date of Birth: 1956-05-20 No Data Recorded  Encounter Date: 01/31/2016      PT End of Session - 01/31/16 1733    Visit Number 10   Number of Visits 14   Date for PT Re-Evaluation 02/18/16   Authorization Type [employer]   PT Start Time 1545   PT Stop Time 1632   PT Time Calculation (min) 47 min   Activity Tolerance Patient tolerated treatment well   Behavior During Therapy [facility] for tasks assessed/performed      Past Medical History  Diagnosis Date  . Asthma   . Sweet's disease   . Autoimmune disease ([place])   . Migraine   . Thyroid disease     hypothyroid  . STD (sexually transmitted disease)     HSV II  . Migraine   . Anxiety   . History of kidney stones   . IBS (irritable bowel syndrome)   . Cervical cancer [facility])     mom took DES  . 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 Anal fissure   . Asthma   . Hyperlipidemia   . Hepatitis A     Past Surgical History  Procedure Laterality Date  . Nasal sinus surgery      x 2  . Abdominal hysterectomy  1988    ?retained ovaries for cx ca  . Internal hemorrhoidectomy    . Elbow surgery Right 1995    tendon repair  . Foot surgery Right   . Breast lumpectomy Left 2009    benign; ?cyst  . Breast reduction surgery Bilateral 09/2010  . Excision vaginal cyst  10-25-2009    benign squamous cyst  . Elbow surgery Right   . Blephoplasty      There were no vitals filed for this visit.      Subjective Assessment - 01/31/16 1549    Subjective "I am doing pretty good, despite the horse trot I did this weeked" some soreness to the touch but no pain otherwise"    Currently in Pain? Yes   Pain Score 2   with touch   Pain Location Back   Pain Orientation Lower;Right   Pain  Descriptors / Indicators Sore   Pain Type Chronic pain   Pain Onset More than a month ago   Pain Frequency Intermittent   Aggravating Factors  occasionally with riding without stretching   Pain Relieving Factors ice, tylenol                         [facility] Adult PT Treatment/Exercise - 01/31/16 1555    Lumbar Exercises: Stretches   Single Knee to Chest Stretch 2 reps;30 seconds   Double Knee to Chest Stretch 2 reps;30 seconds   Lumbar Exercises: Standing   Other Standing Lumbar Exercises SLS bil with 3 way vectors 2 x 10  with red theraband   Lumbar Exercises: Seated   Hip Flexion on Ball AROM;Strengthening;Both;15 reps  with alternating opposite limb movement, 3 sets   Lumbar Exercises: Supine   Bridge 10 reps  with alternating hip flexion    Bridge Limitations verbal cues to keep core tight   Other Supine Lumbar Exercises dead bug 3 x 10 with static hold, 2 x 10 with alternating  arm/ leg extension   Ultrasound   Ultrasound Location R SIJ   Ultrasound Parameters 100% 1.5 w/cm2 x 8 min   Ultrasound Goals Pain                PT Education - 01/31/16 1732    Education provided Yes   Education Details updated HEP with proper form and treatment reationale   Person(s) Educated Patient   Methods Explanation;Verbal cues;Demonstration   Comprehension Verbalized understanding;Returned demonstration             PT Long Term Goals - 01/24/16 1416    PT LONG TERM GOAL #1   Title She will be independent with all exercises issued   Status On-going   PT LONG TERM GOAL #2   Title She will report 75% decreased in RT thigh symptoms   Status On-going   PT LONG TERM GOAL #3   Title Seh will report able to sit in car for 45-60 min without increased pain.    Status On-going   PT LONG TERM GOAL #4   Title She will report able to ride horse for 45-60 min without incr back pain   Status On-going               Plan - 01/31/16 1736    Clinical Impression  Statement pt reports she is doing well with some soreness in the R low back and along parapsinals due to horse competition. Continued work on core strengthening and hip flexor strength which she performed well without issue. pt continue sto iprogress well toward discharge.    PT Next Visit Plan DN, modalites, stretching , assess elecric stim benerfits, hip flexor strengthening, core strengthening, assess reponse to [name]   Consulted and Agree with Plan of Care Patient      Patient will benefit from skilled therapeutic intervention in order to improve the following deficits and impairments:  Decreased activity tolerance, Pain, Increased muscle spasms  Visit Diagnosis: Bilateral low back pain with right-sided sciatica  Right-sided low back pain with right-sided sciatica     Problem List Patient Active Problem List   Diagnosis Date Noted  . Chronic fatigue 07/24/2013  . Arthralgia 07/24/2013  . Depression 03/12/2013  . Insomnia 03/12/2013  . Rash 02/21/2011  . ALLERGIC RHINITIS 08/18/2010  . CERVICAL CANCER, HX OF 11/02/2009  . CERVICAL CANCER 10/26/2009  . HYPOTHYROIDISM 10/26/2009  . 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 SINUSITIS, RECURRENT 10/26/2009  . Asthma 10/26/2009  . LOW BACK PAIN, CHRONIC 10/26/2009  . HEMORRHOIDS, HX OF 10/26/2009  . PERSONAL HISTORY OF URINARY CALCULI 10/26/2009   Starr Lake PT, DPT, LAT, ATC  01/31/2016  5:40 PM      [facility] [address] [place], [place], [address] Phone: [phone]   Fax:  [phone]  Name: Emily Calhoun MRN: [id number] Date of Birth: 1955/09/22   PHYSICAL THERAPY DISCHARGE SUMMARY  Visits from Start of Care: 10  Current functional level related to goals / functional outcomes: See goals   Remaining deficits: Unknown due to pt not returning   Education / Equipment: HEP, theraband for strengthening, posture  Plan:                                                     Patient goals were not met. Patient is being discharged  due to not returning since the last visit.  ?????     Tiersa Dayley PT, DPT, LAT, ATC  03/21/16  2:00 PM

## 2016-02-03 ENCOUNTER — Encounter: Payer: 59 | Admitting: Physical Therapy

## 2016-02-07 ENCOUNTER — Encounter: Payer: 59 | Admitting: Physical Therapy

## 2016-02-10 ENCOUNTER — Ambulatory Visit: Payer: 59 | Admitting: Physical Therapy

## 2016-02-23 MED FILL — CLOBETASOL 0.05% SOLUTION: 0.05 | 10 days supply | Qty: 50 | Fill #0

## 2016-02-23 MED FILL — ESOMEPRAZOLE MAG DR 40 MG C: 40 | 90 days supply | Qty: 90 | Fill #1

## 2016-03-09 DIAGNOSIS — Z85828 Personal history of other malignant neoplasm of skin: Secondary | ICD-10-CM | POA: Diagnosis not present

## 2016-03-09 DIAGNOSIS — L982 Febrile neutrophilic dermatosis [Sweet]: Secondary | ICD-10-CM | POA: Diagnosis not present

## 2016-03-09 DIAGNOSIS — D2261 Melanocytic nevi of right upper limb, including shoulder: Secondary | ICD-10-CM | POA: Diagnosis not present

## 2016-03-09 DIAGNOSIS — L821 Other seborrheic keratosis: Secondary | ICD-10-CM | POA: Diagnosis not present

## 2016-03-09 DIAGNOSIS — L814 Other melanin hyperpigmentation: Secondary | ICD-10-CM | POA: Diagnosis not present

## 2016-03-09 DIAGNOSIS — D1800 Hemangioma unspecified site: Secondary | ICD-10-CM | POA: Diagnosis not present

## 2016-03-09 DIAGNOSIS — M5481 Occipital neuralgia: Secondary | ICD-10-CM | POA: Diagnosis not present

## 2016-03-09 MED FILL — METHYLPREDNISOLONE 4 MG TAB: 4 | 6 days supply | Qty: 21 | Fill #0

## 2016-04-03 DIAGNOSIS — L700 Acne vulgaris: Secondary | ICD-10-CM | POA: Diagnosis not present

## 2016-04-03 MED FILL — SPIRONOLACTONE 50 MG TABLET: 50 | 30 days supply | Qty: 30 | Fill #0

## 2016-04-03 MED FILL — ADAPALENE 0.3% GEL: 0.3 | 30 days supply | Qty: 45 | Fill #0

## 2016-04-04 MED FILL — LEVOTHYROXINE 75 MCG TABLET: 75 | 90 days supply | Qty: 90 | Fill #3

## 2016-04-04 MED FILL — ACYCLOVIR 400 MG TABLET: 400 | 90 days supply | Qty: 90 | Fill #0

## 2016-04-10 MED FILL — XIIDRA 5% EYE DROPS: 5 | 30 days supply | Qty: 60 | Fill #3

## 2016-04-28 DIAGNOSIS — L700 Acne vulgaris: Secondary | ICD-10-CM | POA: Diagnosis not present

## 2016-04-28 DIAGNOSIS — R208 Other disturbances of skin sensation: Secondary | ICD-10-CM | POA: Diagnosis not present

## 2016-05-04 MED FILL — ESCITALOPRAM 20 MG TABLET: 20 | 90 days supply | Qty: 90 | Fill #3

## 2016-05-17 MED FILL — XIIDRA 5% EYE DROPS: 5 | 30 days supply | Qty: 60 | Fill #0

## 2016-05-22 ENCOUNTER — Other Ambulatory Visit: Payer: Self-pay

## 2016-05-22 NOTE — Telephone Encounter (Signed)
Medication refill request: Estradiol 0.075mg  patch Last AEX:  04/27/15 SM Next AEX: none scheduled Last MMG (if hormonal medication request): 08/27/15 BIRADS 1 negative Refill authorized: 05/03/15 #24 w/4 refills; today please advise, will call pt to schedule AEX

## 2016-05-24 MED ORDER — ESTRADIOL 0.075 MG/24HR TD PTTW: MEDICATED_PATCH | TRANSDERMAL | 0 refills | Status: DC

## 2016-05-25 MED FILL — ESTRADIOL 0.075 MG PATCH: 0.075 | 84 days supply | Qty: 24 | Fill #0

## 2016-06-01 ENCOUNTER — Ambulatory Visit (INDEPENDENT_AMBULATORY_CARE_PROVIDER_SITE_OTHER): Payer: 59 | Admitting: Neurology

## 2016-06-01 ENCOUNTER — Other Ambulatory Visit (INDEPENDENT_AMBULATORY_CARE_PROVIDER_SITE_OTHER): Payer: 59

## 2016-06-01 ENCOUNTER — Encounter: Payer: Self-pay | Admitting: Neurology

## 2016-06-01 VITALS — BP 110/70 | HR 88 | Ht 61.75 in | Wt 155.2 lb

## 2016-06-01 DIAGNOSIS — M5410 Radiculopathy, site unspecified: Secondary | ICD-10-CM | POA: Diagnosis not present

## 2016-06-01 DIAGNOSIS — L299 Pruritus, unspecified: Secondary | ICD-10-CM | POA: Diagnosis not present

## 2016-06-01 DIAGNOSIS — R202 Paresthesia of skin: Secondary | ICD-10-CM

## 2016-06-01 DIAGNOSIS — R29898 Other symptoms and signs involving the musculoskeletal system: Secondary | ICD-10-CM

## 2016-06-01 DIAGNOSIS — R21 Rash and other nonspecific skin eruption: Secondary | ICD-10-CM

## 2016-06-01 DIAGNOSIS — M2142 Flat foot [pes planus] (acquired), left foot: Secondary | ICD-10-CM | POA: Diagnosis not present

## 2016-06-01 DIAGNOSIS — E538 Deficiency of other specified B group vitamins: Secondary | ICD-10-CM | POA: Diagnosis not present

## 2016-06-01 DIAGNOSIS — M542 Cervicalgia: Secondary | ICD-10-CM

## 2016-06-01 LAB — CBC WITH DIFFERENTIAL/PLATELET
BASOS PCT: 0.6 % (ref 0.0–3.0)
Basophils Absolute: 0 10*3/uL (ref 0.0–0.1)
EOS PCT: 1.8 % (ref 0.0–5.0)
Eosinophils Absolute: 0.1 10*3/uL (ref 0.0–0.7)
HEMATOCRIT: 41.8 % (ref 36.0–46.0)
HEMOGLOBIN: 14.1 g/dL (ref 12.0–15.0)
LYMPHS PCT: 21.5 % (ref 12.0–46.0)
Lymphs Abs: 1.2 10*3/uL (ref 0.7–4.0)
MCHC: 33.8 g/dL (ref 30.0–36.0)
MCV: 96.9 fl (ref 78.0–100.0)
MONOS PCT: 8.3 % (ref 3.0–12.0)
Monocytes Absolute: 0.5 10*3/uL (ref 0.1–1.0)
Neutro Abs: 3.7 10*3/uL (ref 1.4–7.7)
Neutrophils Relative %: 67.8 % (ref 43.0–77.0)
Platelets: 274 10*3/uL (ref 150.0–400.0)
RBC: 4.32 Mil/uL (ref 3.87–5.11)
RDW: 13.7 % (ref 11.5–15.5)
WBC: 5.5 10*3/uL (ref 4.0–10.5)

## 2016-06-01 LAB — COMPREHENSIVE METABOLIC PANEL
ALT: 17 U/L (ref 0–35)
AST: 21 U/L (ref 0–37)
Albumin: 4.2 g/dL (ref 3.5–5.2)
Alkaline Phosphatase: 82 U/L (ref 39–117)
BUN: 19 mg/dL (ref 6–23)
CHLORIDE: 102 meq/L (ref 96–112)
CO2: 26 meq/L (ref 19–32)
Calcium: 9.3 mg/dL (ref 8.4–10.5)
Creatinine, Ser: 0.7 mg/dL (ref 0.40–1.20)
GFR: 90.43 mL/min (ref >60.00)
GLUCOSE: 100 mg/dL — AB (ref 70–99)
POTASSIUM: 4 meq/L (ref 3.5–5.1)
SODIUM: 135 meq/L (ref 135–145)
Total Bilirubin: 0.5 mg/dL (ref 0.2–1.2)
Total Protein: 6.7 g/dL (ref 6.0–8.3)

## 2016-06-01 LAB — VITAMIN B12: Vitamin B-12: 476 pg/mL (ref 211–911)

## 2016-06-01 MED ORDER — GABAPENTIN 100 MG PO CAPS: ORAL_CAPSULE | ORAL | 3 refills | Status: DC

## 2016-06-01 MED ORDER — CYANOCOBALAMIN 1000 MCG/ML IJ SOLN
1000.0000 ug | Freq: Once | INTRAMUSCULAR | Status: AC
  Administered 2016-06-01: 1000 ug via INTRAMUSCULAR

## 2016-06-01 MED FILL — GABAPENTIN 100 MG CAPSULE: 100 | 34 days supply | Qty: 90 | Fill #0

## 2016-06-01 NOTE — Patient Instructions (Addendum)
1.  Start neurontin 100mg  at bedtime x 3 days, then 2 tablets x 3 weeks, then 3 tablets at bedtime. 2.  MRI cervical spine wwo contrast 3.  NCS/EMG of the RIGHT arm and LEFT leg 4.  Check blood work  We will call you with the results

## 2016-06-01 NOTE — Progress Notes (Signed)
[facility] Clinic Note - Initial Visit   Date: 06/01/16  [name] MRN: [id number] DOB: 07-12-1956   Dear Dr. Dannial Monarch:  Thank you for your kind referral of Emily Calhoun for consultation of dysesthesias. Although her history is well known to you, please allow [name] to reiterate it for the purpose of our medical record. The patient was accompanied to the clinic by self.   History of Present Illness: Emily Calhoun is a 60 y.o. left-handed Caucasian female with hypothyroidism, GERD, depression/anxiety, asthma, hypogammaglobulinemia, cervical cancer s/p hysterectomy (1998), chronic unspecified skin rash presenting for evaluation of itching and burning of her skin.      Starting around 2007, she started having left posterior scalp swelling and burning itch which lasted 4 months.  Symptoms would spontaneously improve for a few months and then return.  No identifiable triggers or alleviating factors.  Two years ago, she has the same symptoms with associated right arm numbness.  This has been ongoing as "flares" for the past 10 years, but reports that for the past several months, she has not had one.  Her major complaint is 10-year history of burning itch of the neck, collar bone, upper arms.  Her symptom usually start with erthematous papular rash which becomes itchy and starts to burn.  She has seen several dermatologists who have not been able to provide a diagnosis.  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  She has not had skin biopsy performed.  The burning and itching can be so severe that she often ends up scratching herself to the point where her skin gets irritated and bleeds.  Symptoms are constant and worse over the past few months.  Exercise and heat makes symptoms worse.  Most recently she sought a second opinion with Dr. Melina Copa who started a compound ointment of  amitriptyline, gabapentin, and vanacream.  This cream helps reduce the intensity for a little while.  Also over the past 10 years, she has noticed weakness of the left foot where she is unable to move her toes. 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 She also has some numbness in the foot.  No associated neck pain.  She walks independently and has not sustained any falls.    She has previously seen Dr. Krista Blue and [facility] for the above-mentioned symptoms. Previous evaluation has included MRI of the cervical spine and thoracic spine performed in 2015 which shows mild disc bulge at C4-5 and C5-6, no evidence of spinal or foraminal stenosis.   Out-side paper records, electronic medical record, and images have been reviewed where available and summarized as:  MRI cervical spine 08/12/2013: 1. Mild broad-based disc bulges at C4-5 and C5-6. No significant disc protrusion or foraminal stenosis.  MRI thoracic spine 08/12/2013: 1. No acute compression fracture of the thoracic spine. 2. Mild right paracentral disc bulge at T8-9.  Past Medical History:  Diagnosis Date  . Anal fissure   . Anxiety   . Asthma   . Asthma   . Autoimmune disease ([place])   . Cervical cancer [facility])    mom took DES  . Hepatitis A   . History of kidney stones   . Hyperlipidemia   . Hypogammaglobulinemia ([place])    history of  .  IBS (irritable bowel syndrome)   . Migraine   . Migraine   . STD (sexually transmitted disease)    HSV II  . Sweet's disease   . Thyroid disease    hypothyroid    Past Surgical History:  Procedure Laterality Date  . ABDOMINAL HYSTERECTOMY  1988   ?retained ovaries for cx ca  . blephoplasty    . BREAST LUMPECTOMY Left 2009   benign; ?cyst  . BREAST REDUCTION SURGERY Bilateral 09/2010  . ELBOW SURGERY Right 1995   tendon repair  . ELBOW SURGERY Right   . EXCISION VAGINAL CYST   10-25-2009   benign squamous cyst  . FOOT SURGERY Right   . internal hemorrhoidectomy    . NASAL SINUS SURGERY     x 2     Medications:  Outpatient Encounter Prescriptions as of 06/01/2016  Medication Sig Note  . acyclovir (ZOVIRAX) 400 MG tablet Take 1 tablet (400 mg total) by mouth daily.   Marland Kitchen albuterol (PROAIR HFA) 108 (90 BASE) MCG/ACT inhaler Inhale 2 puffs into the lungs every 6 (six) hours as needed for wheezing or shortness of breath.   . ALPRAZolam (XANAX) 0.25 MG tablet Take 0.5 mg by mouth at bedtime as needed for sleep.   . bacitracin-polymyxin b (POLYSPORIN) ophthalmic ointment Reported on 11/22/2015 10/07/2015: Received from: [facility]  . clonazePAM (KLONOPIN) 1 MG tablet TAKE 1/2-1 TABLET AT BEDTIME as needed 04/27/2015: Received from: External Pharmacy  . diphenhydrAMINE (BENADRYL) 50 MG capsule Take 50 mg by mouth as needed. Two times a day , uses 2-3 days prior to IVIG infusions per PCP   . escitalopram (LEXAPRO) 20 MG tablet  04/27/2015: Received from: External Pharmacy  . esomeprazole (NEXIUM) 40 MG capsule Take 1 capsule (40 mg total) by mouth daily.   Marland Kitchen estradiol (VIVELLE-DOT) 0.075 MG/24HR PLACE 1 PATCH ONTO THE SKIN 2 TIMES A WEEK. 06/01/2016: Received from: [facility]  . levothyroxine (SYNTHROID, LEVOTHROID) 75 MCG tablet Take 75 mcg by mouth daily before breakfast.   . Lifitegrast Shirley Friar) 5 % SOLN Apply to eye. 10/07/2015: Received from: [facility]  . Linaclotide (LINZESS) 72 MCG CAPS Take 72 mcg by mouth daily.   . Multiple Vitamins-Minerals (MULTIVITAMIN WITH MINERALS) tablet Take by mouth. 10/07/2015: Received from: [facility]  . [DISCONTINUED] estradiol (ESTRACE) 0.1 MG/GM vaginal cream 1 gram vaginally twice weekly   . [DISCONTINUED] estradiol (VIVELLE-DOT) 0.075 MG/24HR PLACE 1 PATCH ONTO THE SKIN 2 TIMES A WEEK.   . gabapentin (NEURONTIN) 100 MG capsule Take 1 tablet  at bedtime x 3 days, then increase 2 tablets at bedtime x 3 days, then increase to 3 tablets at bedtime.   . [DISCONTINUED] Linaclotide (LINZESS) 145 MCG CAPS capsule Take 1 capsule (145 mcg total) by mouth daily as needed.   . [DISCONTINUED] ranitidine (ZANTAC) 150 MG tablet Take 1 tablet (150 mg total) by mouth every evening.   . [EXPIRED] cyanocobalamin ((VITAMIN B-12)) injection 1,000 mcg     No facility-administered encounter medications on file as of 06/01/2016.      Allergies:  Allergies  Allergen Reactions  . Azithromycin Anaphylaxis  . Cephalexin Anaphylaxis  . Cephalosporins Anaphylaxis  . Metronidazole Anaphylaxis  . Penicillins Anaphylaxis  . Sulfonamide Derivatives Anaphylaxis  . Clarithromycin   . Effexor [Venlafaxine] Other (See Comments)    hallucinations  . Gentamicin Sulfate     REACTION: all mycins  . Ketoconazole   . Macrobid [  Nitrofurantoin]   . Morphine Sulfate   . Singulair [Montelukast Sodium]     Nervousness and depression per pt  . Tobramycin-Dexamethasone   . Wellbutrin [Bupropion]     Hallucinations     Family History: Family History  Problem Relation Age of Onset  . Allergies Mother   . Heart disease Mother   . Heart disease Father   . Clotting disorder Father     per pt had clot in is intestines  . Diabetes Father   . Thyroid disease Father   . Diabetes Sister   . Thyroid disease Sister   . Other Son     Hypogammaglobulinemia  . Colon cancer Neg Hx   . Esophageal cancer Neg Hx   . Stomach cancer Neg Hx   . Pancreatic cancer Neg Hx   . Liver disease Neg Hx     Social History: Social History  Substance Use Topics  . Smoking status: Never Smoker  . Smokeless tobacco: Never Used  . Alcohol use 4.8 oz/week    8 Standard drinks or equivalent per week     Comment: 2 glasses wine nightly.     Social History   Social History Narrative   Lives with husband in a 2 story home.  Has 2 children.     Works as an Futures trader.     Education: college.    Review of Systems:  CONSTITUTIONAL: No fevers, chills, night sweats, or weight loss.   EYES: No visual changes or eye pain ENT: No hearing changes.  No history of nose bleeds.   RESPIRATORY: No cough, wheezing and shortness of breath.   CARDIOVASCULAR: Negative for chest pain, and palpitations.   GI: Negative for abdominal discomfort, blood in stools or black stools.  No recent change in bowel habits.   GU:  No history of incontinence.   MUSCLOSKELETAL: +history of joint pain or swelling.  No myalgias.   SKIN: +for lesions, +rash, +itching.   HEMATOLOGY/ONCOLOGY: Negative for prolonged bleeding, bruising easily, and swollen nodes.     ENDOCRINE: Negative for cold or heat intolerance, polydipsia or goiter.   PSYCH:  No depression +anxiety symptoms.   NEURO: As Above.   Vital Signs:  BP 110/70   Pulse 88   Ht 5' 1.75" (1.568 m)   Wt 155 lb 4 oz (70.4 kg)   LMP 07/17/1986   SpO2 95%   BMI 28.63 kg/m    General Medical Exam:   General:  Well appearing, comfortable.   Eyes/ENT: see cranial nerve examination.   Neck: No masses appreciated.  Full range of motion without tenderness.  No carotid bruits. Respiratory:  Clear to auscultation, good air entry bilaterally.   Cardiac:  Regular rate and rhythm, no murmur.   Extremities:  No deformities, edema, or skin discoloration.  Skin:  Macular erythematous rash over the neck and upper chest, with occasional erythematous lesions which appear excoriated over the right arm   Neurological Exam: MENTAL STATUS including orientation to time, place, person, recent and remote memory, attention span and concentration, language, and fund of knowledge is normal.  Speech is not dysarthric.  CRANIAL NERVES: II:  No visual field defects.  Unremarkable fundi.   III-IV-VI: Pupils equal round and reactive to light.  Normal conjugate, extra-ocular eye movements in all directions of gaze.  No nystagmus.  No ptosis.   V:  Normal  facial sensation.     VII:  Normal facial symmetry and movements.   VIII:  Normal hearing  and vestibular function.   IX-X:  Normal palatal movement.   XI:  Normal shoulder shrug and head rotation.   XII:  Normal tongue strength and range of motion, no deviation or fasciculation.  MOTOR:  No atrophy, fasciculations or abnormal movements.  No pronator drift.  Tone is normal.    Right Upper Extremity:    Left Upper Extremity:    Deltoid  5/5   Deltoid  5/5   Biceps  5/5   Biceps  5/5   Triceps  5/5   Triceps  5/5   Wrist extensors  5/5   Wrist extensors  5/5   Wrist flexors  5/5   Wrist flexors  5/5   Finger extensors  5/5   Finger extensors  5/5   Finger flexors  5/5   Finger flexors  5/5   Dorsal interossei  5/5   Dorsal interossei  5/5   Abductor pollicis  5/5   Abductor pollicis  5/5   Tone (Ashworth scale)  0  Tone (Ashworth scale)  0   Right Lower Extremity:    Left Lower Extremity:    Hip flexors  5/5   Hip flexors  5/5   Hip extensors  5/5   Hip extensors  5/5   Knee flexors  5/5   Knee flexors  5/5   Knee extensors  5/5   Knee extensors  5/5   Dorsiflexors  5/5   Dorsiflexors  5/5   Plantarflexors  5/5   Plantarflexors  5/5   Toe extensors  5/5   Toe extensors * 5/5   Toe flexors  5/5   Toe flexors * 4/5   Tone (Ashworth scale)  0  Tone (Ashworth scale)  0  * reduced range of motion of toe flexion and extension, although toe extension has intact motor strength  MSRs:  Right                                                                 Left brachioradialis 2+  brachioradialis 2+  biceps 2+  biceps 2+  triceps 2+  triceps 2+  patellar 2+  patellar 2+  ankle jerk 2+  ankle jerk 1+  Hoffman no  Hoffman no  plantar response down  plantar response down   SENSORY:  Reduced pin prick and temperature over the dorsum of the left foot. Vibration is intact throughout.  She has a patchy area of reduced temperature over the right supraclavicular groove.  Romberg's sign absent.    COORDINATION/GAIT: Normal finger-to- nose-finger.  Intact rapid alternating movements bilaterally. Gait narrow based and stable. Tandem and stressed gait intact.    IMPRESSION: 1.  Long-standing history of dysesthesias manifesting with burning itch involving the neck, upper chest, and upper arms.  Although cervical radiculopathy is possible, the presentation of itching and a rash is very atypical for nerve impingement.  Alternative possibility is brachioradial pruritis, but this is usually isolated to the upper arm and forearm over the insertion of the brachioradialis muscle, and does not manifest with skin changes.   To further investigate, imaging of the cervical spine will be ordered to look for new stenosis causing nerve impingement NCS/EMG will also be ordered to look for peripheral nerve pathology.  She may need small fiber  biopsy going forward  2.  Left foot weakness with toe flexion.  Surprisingly she is able to heel and toe walk without difficulty showing that her muscles are actually very strong.  It is possible that she has an orthopeadic issue preventing full ROM of the left toes.  With her mildly diminished left Achilles reflex, however we will order electrodiagnostic testing to evaluate for S1 radiculopathy.  3.  Nonspecific chronic skin rash, she may need skin biopsy but I will defer this to her dermatologist  PLAN/RECOMMENDATIONS:  1.  Check CBC, CMP, vitamin B12, TSH, ANA, copper, vitamin B1, SPEP with IFE, ESR, CRP, celiac panel 2.  MRI cervical spine wwo contrast 3.  NCS/EMG of the RIGHT arm and LEFT leg 4.  Start gabapentin 166m at bedtime and titrate to 3037mat bedtime.  If she is tolerating this, further uptitrate as tolerable  Further recommendations will be based on the results of the above testing.  The duration of this appointment visit was 60 minutes of face-to-face time with the patient.  Greater than 50% of this time was spent in counseling, explanation of  diagnosis, planning of further management, and coordination of care.   Thank you for allowing me to participate in patient's care.  If I can answer any additional questions, I would be pleased to do so.    Sincerely,    Charmane Protzman K. PaPosey ProntoDO

## 2016-06-02 LAB — C-REACTIVE PROTEIN: CRP: 3.8 mg/L (ref <8.0)

## 2016-06-02 LAB — ANA: Anti Nuclear Antibody(ANA): NEGATIVE

## 2016-06-02 LAB — SEDIMENTATION RATE: Sed Rate: 6 mm/hr (ref 0–30)

## 2016-06-04 LAB — VITAMIN B1: VITAMIN B1 (THIAMINE): 11 nmol/L (ref 8–30)

## 2016-06-05 LAB — PROTEIN ELECTROPHORESIS, SERUM
ALBUMIN ELP: 4.3 g/dL (ref 3.8–4.8)
ALPHA-2-GLOBULIN: 0.6 g/dL (ref 0.5–0.9)
Alpha-1-Globulin: 0.3 g/dL (ref 0.2–0.3)
BETA 2: 0.3 g/dL (ref 0.2–0.5)
BETA GLOBULIN: 0.4 g/dL (ref 0.4–0.6)
Gamma Globulin: 0.7 g/dL — ABNORMAL LOW (ref 0.8–1.7)
Total Protein, Serum Electrophoresis: 6.7 g/dL (ref 6.1–8.1)

## 2016-06-05 LAB — IMMUNOFIXATION ELECTROPHORESIS
IgA: 75 mg/dL — ABNORMAL LOW (ref 81–463)
IgG (Immunoglobin G), Serum: 830 mg/dL (ref 694–1618)
IgM, Serum: 36 mg/dL — ABNORMAL LOW (ref 48–271)

## 2016-06-05 LAB — CERULOPLASMIN: CERULOPLASMIN: 30 mg/dL (ref 18–53)

## 2016-06-06 ENCOUNTER — Ambulatory Visit (INDEPENDENT_AMBULATORY_CARE_PROVIDER_SITE_OTHER): Payer: 59 | Admitting: Neurology

## 2016-06-06 DIAGNOSIS — L299 Pruritus, unspecified: Secondary | ICD-10-CM | POA: Diagnosis not present

## 2016-06-06 DIAGNOSIS — R202 Paresthesia of skin: Secondary | ICD-10-CM

## 2016-06-06 NOTE — Procedures (Signed)
[facility]  [place], [place]  [place], [place] [address] Tel: [phone] Fax:  [phone] Test Date:  06/06/2016  Patient: Emily Calhoun DOB: [birth year] Physician: Narda Amber, DO  Sex: Female Height: 5\' 2"  Ref Phys: Narda Amber, DO  ID#: [id number] Temp: 32.1C Technician: Jerilynn Mages. Dean   Patient Complaints: This is a 60 year-old female referred for evaluation of upper extremity, chest, and neck paresthesias and left foot weakness.   NCV & EMG Findings: Extensive electrodiagnostic testing of the right upper extremity and left lower extremity shows:  1. Right median, ulnar, radial, and mixed palmar sensory responses are within normal limits. 2. Right median and ulnar motor responses are within normal limits. 3. Left sural and superficial peroneal sensory responses are within normal limits. 4. Left peroneal and motor responses are within normal limits. 5. There is no evidence of active or chronic motor axonal loss changes affecting any of the tested muscles. The posterior tibialis and medial gastrocnemius muscles shows a variable firing pattern and incomplete motor unit recruitment; these findings may be due to poor effort, pain, musculoskeletal pathology, or less likely central disorder of motor unit control.  Impression: This is a normal study. In particular, there is no evidence of a right cervical radiculopathy, left lumbosacral radiculopathy, or generalized sensorimotor polyneuropathy.   ___________________________ Narda Amber, DO    Nerve Conduction Studies Anti Sensory Summary Table   Stim Site NR Peak (ms) Norm Peak (ms) P-T Amp (V) Norm P-T Amp  Right Median Anti Sensory (2nd Digit)  32.1C  Wrist    2.9 <3.8 27.8 >10  Right Radial Anti Sensory (Base 1st Digit)  32.1C  Site 2    1.9  31.2   Left Sup Peroneal Anti Sensory (Ant Lat Mall)  32.1C  12 cm    3.8 <4.6 8.0 >3  Left Sural Anti Sensory (Lat Mall)  32.1C  Calf    2.4 <4.6 11.6 >3  Right  Ulnar Anti Sensory (5th Digit)  32.1C  Wrist    3.0 <3.2 25.7 >5   Motor Summary Table   Stim Site NR Onset (ms) Norm Onset (ms) O-P Amp (mV) Norm O-P Amp Site1 Site2 Delta-0 (ms) Dist (cm) Vel (m/s) Norm Vel (m/s)  Right Median Motor (Abd Poll Brev)  32.1C  Wrist    2.7 <4.0 6.0 >5 Elbow Wrist 3.7 22.0 59 >50  Elbow    6.4  6.0         Left Peroneal Motor (Ext Dig Brev)  32.1C  Ankle    4.2 <6.0 2.8 >2.5 B Fib Ankle 6.1 27.0 44 >40  B Fib    10.3  2.5  Poplt B Fib 1.8 10.0 56 >40  Poplt    12.1  2.5         Left Tibial Motor (Abd Hall Brev)  32.1C  Ankle    4.1 <6.0 9.8 >4 Knee Ankle 6.8 36.0 53 >40  Knee    10.9  6.5         Right Ulnar Motor (Abd Dig Minimi)  32.1C  Wrist    2.3 <3.1 8.7 >7 B Elbow Wrist 2.6 17.0 65 >50  B Elbow    4.9  8.3  A Elbow B Elbow 1.5 10.0 67 >50  A Elbow    6.4  8.1          Comparison Summary Table   Stim Site NR Peak (ms) Norm Peak (ms) P-T Amp (V) Site1 Site2 Delta-P (  ms) Norm Delta (ms)  Right Median/Ulnar Palm Comparison (Wrist - 8cm)  32.1C  Median Palm    1.7 <2.2 52.5 Median Palm Ulnar Palm 0.1   Ulnar Palm    1.6 <2.2 19.2       EMG   Side Muscle Ins Act Fibs Psw Fasc Number Recrt Dur Dur. Amp Amp. Poly Poly. Comment  Right 1stDorInt Nml Nml Nml Nml Nml Nml Nml Nml Nml Nml Nml Nml N/A  Right Ext Indicis Nml Nml Nml Nml Nml Nml Nml Nml Nml Nml Nml Nml N/A  Right PronatorTeres Nml Nml Nml Nml Nml Nml Nml Nml Nml Nml Nml Nml N/A  Right Biceps Nml Nml Nml Nml Nml Nml Nml Nml Nml Nml Nml Nml N/A  Right Triceps Nml Nml Nml Nml Nml Nml Nml Nml Nml Nml Nml Nml N/A  Right Deltoid Nml Nml Nml Nml Nml Nml Nml Nml Nml Nml Nml Nml N/A  Left AntTibialis Nml Nml Nml Nml Nml Nml Nml Nml Nml Nml Nml Nml N/A  Left Gastroc Nml Nml Nml Nml 2- Mod-V Nml Nml Nml Nml Nml Nml N/A  Left BicepsFemS Nml Nml Nml Nml Nml Nml Nml Nml Nml Nml Nml Nml N/A  Left PostTibialis Nml Nml Nml Nml 1- Mod-V Nml Nml Nml Nml Nml Nml N/A  Left RectFemoris Nml Nml Nml Nml  Nml Nml Nml Nml Nml Nml Nml Nml N/A  Left GluteusMed Nml Nml Nml Nml Nml Nml Nml Nml Nml Nml Nml Nml N/A      Waveforms:

## 2016-06-07 DIAGNOSIS — Z79899 Other long term (current) drug therapy: Secondary | ICD-10-CM | POA: Diagnosis not present

## 2016-06-07 DIAGNOSIS — E785 Hyperlipidemia, unspecified: Secondary | ICD-10-CM | POA: Diagnosis not present

## 2016-06-07 DIAGNOSIS — E559 Vitamin D deficiency, unspecified: Secondary | ICD-10-CM | POA: Diagnosis not present

## 2016-06-07 DIAGNOSIS — Z1159 Encounter for screening for other viral diseases: Secondary | ICD-10-CM | POA: Diagnosis not present

## 2016-06-07 DIAGNOSIS — Z0000 Encounter for general adult medical examination without abnormal findings: Secondary | ICD-10-CM | POA: Diagnosis not present

## 2016-06-07 DIAGNOSIS — E039 Hypothyroidism, unspecified: Secondary | ICD-10-CM | POA: Diagnosis not present

## 2016-06-07 DIAGNOSIS — Z23 Encounter for immunization: Secondary | ICD-10-CM | POA: Diagnosis not present

## 2016-06-07 DIAGNOSIS — G4700 Insomnia, unspecified: Secondary | ICD-10-CM | POA: Diagnosis not present

## 2016-06-07 DIAGNOSIS — R208 Other disturbances of skin sensation: Secondary | ICD-10-CM | POA: Diagnosis not present

## 2016-06-20 MED FILL — ESOMEPRAZOLE MAG DR 40 MG C: 40 | 90 days supply | Qty: 90 | Fill #0

## 2016-06-27 ENCOUNTER — Telehealth: Payer: Self-pay

## 2016-06-27 ENCOUNTER — Other Ambulatory Visit: Payer: 59

## 2016-06-27 ENCOUNTER — Ambulatory Visit
Admission: RE | Admit: 2016-06-27 | Discharge: 2016-06-27 | Disposition: A | Discharge status: Home / Self Care | Payer: 59 | Source: Ambulatory Visit | Attending: Neurology | Admitting: Neurology

## 2016-06-27 ENCOUNTER — Other Ambulatory Visit: Payer: Self-pay | Admitting: Neurology

## 2016-06-27 DIAGNOSIS — M50222 Other cervical disc displacement at C5-C6 level: Secondary | ICD-10-CM | POA: Diagnosis not present

## 2016-06-27 DIAGNOSIS — L299 Pruritus, unspecified: Secondary | ICD-10-CM

## 2016-06-27 DIAGNOSIS — R200 Anesthesia of skin: Secondary | ICD-10-CM

## 2016-06-27 DIAGNOSIS — R202 Paresthesia of skin: Secondary | ICD-10-CM | POA: Diagnosis not present

## 2016-06-27 MED ORDER — GADOBENATE DIMEGLUMINE 529 MG/ML IV SOLN
14.0000 mL | Freq: Once | INTRAVENOUS | Status: AC | PRN
  Administered 2016-06-27: 14 mL via INTRAVENOUS

## 2016-06-27 NOTE — Telephone Encounter (Signed)
Orders placed based on recent note found under "Lab" tab.

## 2016-06-28 ENCOUNTER — Other Ambulatory Visit: Payer: Self-pay | Admitting: *Deleted

## 2016-06-28 LAB — ENDOMYSIAL AB IGA RFLX TITER: ENDOMYSIAL SCREEN: NEGATIVE

## 2016-06-28 LAB — IGA: IgA: 67 mg/dL — ABNORMAL LOW (ref 81–463)

## 2016-06-28 MED ORDER — GABAPENTIN 300 MG PO CAPS: ORAL_CAPSULE | ORAL | 3 refills | Status: DC

## 2016-06-29 LAB — TISSUE TRANSGLUTAMINASE, IGA: Tissue Transglutaminase Ab, IgA: 1 U/mL (ref <4)

## 2016-06-29 LAB — GLIADIN ANTIBODIES, SERUM
GLIADIN IGG: 4 U (ref <20)
Gliadin IgA: 3 Units (ref <20)

## 2016-06-29 LAB — COPPER, SERUM: Copper: 110 ug/dL (ref 70–175)

## 2016-06-29 LAB — TISSUE TRANSGLUTAMINASE, IGG: Tissue Transglut Ab: 1 U/mL (ref <6)

## 2016-06-30 MED FILL — LEVOTHYROXINE 75 MCG TABLET: 75 | 90 days supply | Qty: 90 | Fill #0

## 2016-06-30 MED FILL — GABAPENTIN 100 MG CAPSULE: 100 | 34 days supply | Qty: 90 | Fill #1

## 2016-06-30 MED FILL — GABAPENTIN 300 MG CAPSULE: 300 | 90 days supply | Qty: 90 | Fill #0

## 2016-06-30 MED FILL — ACYCLOVIR 400 MG TABLET: 400 | 30 days supply | Qty: 30 | Fill #0

## 2016-06-30 MED FILL — XIIDRA 5% EYE DROPS: 5 | 30 days supply | Qty: 60 | Fill #1

## 2016-07-05 DIAGNOSIS — B009 Herpesviral infection, unspecified: Secondary | ICD-10-CM | POA: Diagnosis not present

## 2016-07-05 DIAGNOSIS — Z79890 Hormone replacement therapy (postmenopausal): Secondary | ICD-10-CM | POA: Diagnosis not present

## 2016-07-05 MED FILL — ESTRADIOL 0.05 MG PATCH: 0.05 | 84 days supply | Qty: 24 | Fill #0

## 2016-07-07 ENCOUNTER — Telehealth: Payer: Self-pay | Admitting: Neurology

## 2016-07-07 NOTE — Telephone Encounter (Signed)
Called patient to get a clinical update.  She reports pain is better controlled with gabapentin 300mg  at bedtime, but she continues to have burning pain around 3-4 pm.  I instructed her to take gabapentin 100mg  at 2pm and continue 300mg  at bedtime.  She would also like to try physical therapy for her right arm pain and paresthesias, so we will send this referral. She will call to schedule return visit, if needed.  Donika K. Posey Pronto, DO

## 2016-07-07 NOTE — Telephone Encounter (Signed)
Referral sent 

## 2016-07-26 ENCOUNTER — Ambulatory Visit: Payer: 59 | Admitting: Neurology

## 2016-08-03 MED FILL — XIIDRA 5% EYE DROPS: 5 | 30 days supply | Qty: 60 | Fill #2

## 2016-08-04 MED FILL — ESCITALOPRAM 20 MG TABLET: 20 | 90 days supply | Qty: 90 | Fill #0

## 2016-08-08 MED FILL — LINZESS 145 MCG CAPSULE: 145 | 30 days supply | Qty: 30 | Fill #1

## 2016-08-10 DIAGNOSIS — L578 Other skin changes due to chronic exposure to nonionizing radiation: Secondary | ICD-10-CM | POA: Diagnosis not present

## 2016-08-10 DIAGNOSIS — R208 Other disturbances of skin sensation: Secondary | ICD-10-CM | POA: Diagnosis not present

## 2016-08-10 DIAGNOSIS — L814 Other melanin hyperpigmentation: Secondary | ICD-10-CM | POA: Diagnosis not present

## 2016-08-10 DIAGNOSIS — D485 Neoplasm of uncertain behavior of skin: Secondary | ICD-10-CM | POA: Diagnosis not present

## 2016-08-18 MED FILL — ACYCLOVIR 400 MG TABLET: 400 | 90 days supply | Qty: 90 | Fill #0

## 2016-08-21 ENCOUNTER — Encounter: Payer: Self-pay | Admitting: Internal Medicine

## 2016-09-11 MED FILL — GABAPENTIN 100 MG CAPSULE: 100 | 34 days supply | Qty: 90 | Fill #2

## 2016-09-11 MED FILL — XIIDRA 5% EYE DROPS: 5 | 30 days supply | Qty: 60 | Fill #3

## 2016-09-11 MED FILL — ESOMEPRAZOLE MAG DR 40 MG C: 40 | 90 days supply | Qty: 90 | Fill #1

## 2016-09-14 DIAGNOSIS — E785 Hyperlipidemia, unspecified: Secondary | ICD-10-CM | POA: Diagnosis not present

## 2016-09-15 MED FILL — clonazePAM 1 MG TABS: 1 | 30 days supply | Qty: 30 | Fill #0

## 2016-10-06 DIAGNOSIS — Z1231 Encounter for screening mammogram for malignant neoplasm of breast: Secondary | ICD-10-CM | POA: Diagnosis not present

## 2016-10-18 MED FILL — XIIDRA 5% EYE DROPS: 5 | 30 days supply | Qty: 60 | Fill #0

## 2016-10-18 MED FILL — LEVOTHYROXINE 75 MCG TABLET: 75 | 90 days supply | Qty: 90 | Fill #1

## 2016-10-25 DIAGNOSIS — H524 Presbyopia: Secondary | ICD-10-CM | POA: Diagnosis not present

## 2016-10-25 DIAGNOSIS — H5203 Hypermetropia, bilateral: Secondary | ICD-10-CM | POA: Diagnosis not present

## 2016-10-25 DIAGNOSIS — H52223 Regular astigmatism, bilateral: Secondary | ICD-10-CM | POA: Diagnosis not present

## 2016-11-06 MED FILL — ESCITALOPRAM 20 MG TABLET: 20 | 90 days supply | Qty: 90 | Fill #1

## 2016-11-06 MED FILL — ACYCLOVIR 400 MG TABLET: 400 | 90 days supply | Qty: 90 | Fill #1

## 2016-11-09 DIAGNOSIS — L570 Actinic keratosis: Secondary | ICD-10-CM | POA: Diagnosis not present

## 2016-11-09 DIAGNOSIS — L905 Scar conditions and fibrosis of skin: Secondary | ICD-10-CM | POA: Diagnosis not present

## 2016-11-09 DIAGNOSIS — L814 Other melanin hyperpigmentation: Secondary | ICD-10-CM | POA: Diagnosis not present

## 2016-11-09 DIAGNOSIS — L578 Other skin changes due to chronic exposure to nonionizing radiation: Secondary | ICD-10-CM | POA: Diagnosis not present

## 2016-11-14 ENCOUNTER — Telehealth: Payer: Self-pay | Admitting: Interventional Cardiology

## 2016-11-14 MED FILL — XIIDRA 5% EYE DROPS: 5 | 90 days supply | Qty: 180 | Fill #1

## 2016-11-14 MED FILL — ESTRADIOL 0.05 MG PATCH: 0.05 | 84 days supply | Qty: 24 | Fill #1

## 2016-11-14 NOTE — Telephone Encounter (Signed)
Mr. Annalaya Wile, the patient's husband, reached out inquiring about re-establishing care with Dr. Daneen Schick. Mr. Maxim stated that Mrs. 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   Mr. Marcoux stated that it has been at least 4 years (if not more) since Mrs. Counce saw Dr. Tamala Julian. I do not see any records in [facility], therefore, I believe the patient may have been established with Dr. Tamala Julian when he was with [facility]. I will continue to investigate the whereabouts of those records.  I left a voicemail on Ms. Ogawa's cell phone asking that she call me directly for next steps to get scheduled.  Mrs. Capek returned my call within minutes. I scheduled her to be re-established with Dr. Tamala Julian on 12/08/16 @ 2:20pm. I offered to put Mrs. Laflam on our wait list so if something opened up sooner then we could try to see her on an earlier date and she politely declined the offered. She said she would be out of town.   I let Mrs. Jonsson know that her PCP will need to fax a referral and her current records to our [facility] location. I gave her the [phone] fax number.  Mrs. Bartha said she would call [facility] office right away to request that referral be faxed. Mrs. Mayfield is going to give Dr. Myer Peer office my direct line to contact me if they have any questions.  I'm copying Maude Leriche, Dr. Thompson Caul nurse, as an FYI only. No action needed at this time.

## 2016-11-22 ENCOUNTER — Encounter: Payer: Self-pay | Admitting: Interventional Cardiology

## 2016-11-29 DIAGNOSIS — J0101 Acute recurrent maxillary sinusitis: Secondary | ICD-10-CM | POA: Diagnosis not present

## 2016-11-29 MED FILL — DOXYCYCLINE MONO 100 MG CAP: 100 | 10 days supply | Qty: 20 | Fill #0

## 2016-12-06 DIAGNOSIS — I517 Cardiomegaly: Secondary | ICD-10-CM | POA: Insufficient documentation

## 2016-12-08 ENCOUNTER — Encounter: Payer: Self-pay | Admitting: Interventional Cardiology

## 2016-12-08 ENCOUNTER — Ambulatory Visit (INDEPENDENT_AMBULATORY_CARE_PROVIDER_SITE_OTHER): Payer: 59 | Admitting: Interventional Cardiology

## 2016-12-08 VITALS — BP 100/62 | HR 74 | Ht 62.0 in | Wt 154.4 lb

## 2016-12-08 DIAGNOSIS — E7800 Pure hypercholesterolemia, unspecified: Secondary | ICD-10-CM

## 2016-12-08 DIAGNOSIS — I447 Left bundle-branch block, unspecified: Secondary | ICD-10-CM

## 2016-12-08 DIAGNOSIS — R0609 Other forms of dyspnea: Secondary | ICD-10-CM | POA: Diagnosis not present

## 2016-12-08 DIAGNOSIS — I10 Essential (primary) hypertension: Secondary | ICD-10-CM

## 2016-12-08 NOTE — Patient Instructions (Signed)
Medication Instructions:  None  Labwork: None  Testing/Procedures: Your physician has requested that you have an exercise tolerance test. For further information please visit [email or web address]. Please also follow instruction sheet, as given.  Your physician has requested that you have an echocardiogram. Echocardiography is a painless test that uses sound waves to create images of your heart. It provides your doctor with information about the size and shape of your heart and how well your heart's chambers and valves are working. This procedure takes approximately one hour. There are no restrictions for this procedure.  Your physician has requested that you have cardiac CT. Cardiac computed tomography (CT) is a painless test that uses an x-ray machine to take clear, detailed pictures of your heart. For further information please visit [email or web address]. Please follow instruction sheet as given.   Follow-Up: Your physician recommends that you schedule a follow-up appointment 1 month after testing is completed.    Any Other Special Instructions Will Be Listed Below (If Applicable).     If you need a refill on your cardiac medications before your next appointment, please call your pharmacy.

## 2016-12-08 NOTE — Progress Notes (Signed)
Cardiology Office Note    Date:  12/08/2016   ID:  Emily Calhoun, [place] [birth year], MRN [id number]  PCP:  Maurice Small, MD  Cardiologist: Sinclair Grooms, MD   Chief Complaint  Patient presents with  . Shortness of Breath    History of Present Illness:  Emily Calhoun is a 61 y.o. female referred by [name] MD for evaluation of shortness of breath.  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 There is concern that blood pressure and isometric activity during riding may be precipitating cardiac induced dyspnea. Greater than 5 years ago she was evaluated for similar complaints, at the time much milder than now. That evaluation included a stress nuclear study that identified exercise-induced left bundle but no evidence of ischemia. An echocardiogram was performed and demonstrated a structurally normal heart. Ejection fraction was 60%. There was no evidence of diastolic dysfunction or hypertrophy. She denies chest discomfort. This positive family history of premature atherosclerosis. She has known have extremely high lipids that have not been managed with medical therapy.  Past Medical History:  Diagnosis Date  . Anal fissure   . Anxiety   . Asthma   . Asthma   . Autoimmune disease ([place])   . Cervical cancer [facility])    mom took DES  . Hepatitis A   . History of kidney stones   . Hyperlipidemia   . 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 IBS (irritable bowel syndrome)   . Migraine   . Migraine   . STD (sexually transmitted disease)    HSV II  . Sweet's disease   . Thyroid disease    hypothyroid    Past Surgical History:  Procedure Laterality Date  . ABDOMINAL HYSTERECTOMY  1988   ?retained ovaries for cx ca  . blephoplasty    . BREAST LUMPECTOMY Left 2009   benign; ?cyst  . BREAST REDUCTION SURGERY Bilateral 09/2010  . ELBOW SURGERY Right 1995   tendon repair  . ELBOW SURGERY Right   . EXCISION VAGINAL CYST  10-25-2009   benign  squamous cyst  . FOOT SURGERY Right   . internal hemorrhoidectomy    . NASAL SINUS SURGERY     x 2    Current Medications: Outpatient Medications Prior to Visit  Medication Sig Dispense Refill  . acyclovir (ZOVIRAX) 400 MG tablet Take 1 tablet (400 mg total) by mouth daily. 90 tablet 4  . albuterol (PROAIR HFA) 108 (90 BASE) MCG/ACT inhaler Inhale 2 puffs into the lungs every 6 (six) hours as needed for wheezing or shortness of breath. 1 Inhaler 3  . bacitracin-polymyxin b (POLYSPORIN) ophthalmic ointment Reported on 11/22/2015    . clonazePAM (KLONOPIN) 1 MG tablet TAKE 1/2-1 TABLET AT BEDTIME as needed  0  . escitalopram (LEXAPRO) 20 MG tablet   3  . esomeprazole (NEXIUM) 40 MG capsule Take 1 capsule (40 mg total) by mouth daily. 30 capsule 3  . estradiol (VIVELLE-DOT) 0.075 MG/24HR PLACE 1 PATCH ONTO THE SKIN 2 TIMES A WEEK.    Marland Kitchen levothyroxine (SYNTHROID, LEVOTHROID) 75 MCG tablet Take 75 mcg by mouth daily before breakfast.    . Lifitegrast (XIIDRA) 5 % SOLN Apply to eye.    . Linaclotide (LINZESS) 72 MCG CAPS Take 72 mcg by mouth daily. 30 capsule 3  . Multiple Vitamins-Minerals (MULTIVITAMIN WITH MINERALS) tablet Take by mouth.    . ALPRAZolam (XANAX) 0.25 MG tablet Take 0.5 mg by  mouth at bedtime as needed for sleep.    . diphenhydrAMINE (BENADRYL) 50 MG capsule Take 50 mg by mouth as needed. Two times a day , uses 2-3 days prior to IVIG infusions per PCP    . gabapentin (NEURONTIN) 300 MG capsule Take 1 capsule (300 mg) at bedtime. (Patient not taking: Reported on 12/08/2016) 90 capsule 3   No facility-administered medications prior to visit.      Allergies:   Azithromycin; Cephalexin; Cephalosporins; Metronidazole; Penicillins; Sulfonamide derivatives; Clarithromycin; Effexor [venlafaxine]; Gentamicin sulfate; Ketoconazole; Macrobid [nitrofurantoin]; Morphine sulfate; Singulair [montelukast sodium]; Tobramycin-dexamethasone; and Wellbutrin [bupropion]   Social History   Social  History  . Marital status: Married    Spouse name: N/A  . Number of children: 2  . Years of education: N/A   Occupational History  . Unemployed Unemployed   Social History Main Topics  . Smoking status: Never Smoker  . Smokeless tobacco: Never Used  . Alcohol use 4.8 oz/week    8 Standard drinks or equivalent per week     Comment: 2 glasses wine nightly.    . Drug use: No  . Sexual activity: Yes    Partners: Male    Birth control/ protection: Surgical     Comment: TAH   Other Topics Concern  . None   Social History Narrative   Lives with husband in a 2 story home.  Has 2 children.     Works as an Futures trader.    Education: college.     Family History:  The patient's family history includes Allergies in her mother; Clotting disorder in her father; Diabetes in her father and sister; Heart disease in her father and mother; Other in her son; Thyroid disease in her father and sister.   ROS:   Please see the history of present illness.    She has occasional cough, dyspnea on exertion, hearing loss, excessive sweating, when she is very short of breath there is tightness in the chest. She admits constipation, dizziness, rash, and headaches.  All other systems reviewed and are negative.   PHYSICAL EXAM:   VS:  BP 100/62 (BP Location: Left Arm)   Pulse 74   Ht 5\' 2"  (1.575 m)   Wt 154 lb 6.4 oz (70 kg)   LMP 07/17/1986   BMI 28.24 kg/m    GEN: Well nourished, well developed, in no acute distress  HEENT: normal  Neck: no JVD, carotid bruits, or masses Cardiac: RRR; no murmurs, rubs, or gallops,no edema  Respiratory:  clear to auscultation bilaterally, normal work of breathing GI: soft, nontender, nondistended, + BS MS: no deformity or atrophy  Skin: warm and dry, no rash Neuro:  Alert and Oriented x 3, Strength and sensation are intact Psych: euthymic mood, full affect  Wt Readings from Last 3 Encounters:  12/08/16 154 lb 6.4 oz (70 kg)  06/01/16 155 lb 4 oz  (70.4 kg)  10/07/15 154 lb (69.9 kg)      Studies/Labs Reviewed:   EKG:  EKG  Heart rate 74 bpm with interventricular conduction delay/atypical left bundle branch block. QRS duration 130 ms. PR interval is normal. When compared to the prior tracing the bundle branch block is new.  Recent Labs: 06/01/2016: ALT 17; BUN 19; Creatinine, Ser 0.70; Hemoglobin 14.1; Platelets 274.0; Potassium 4.0; Sodium 135   Lipid Panel No results found for: CHOL, TRIG, HDL, CHOLHDL, VLDL, LDLCALC, LDLDIRECT  Additional studies/ records that were reviewed today include:  Not available undergone system. The echocardiogram  performed at [facility] in 2012 demonstrated normal LV size and function. Normal right heart size and function. No left ventricular hypertrophy.  Stress myocardial perfusion imaging demonstrated normal myocardial perfusion, normal heart size and function, and exercise-induced left bundle-branch block. The study was converted to [place] perfusion imaging.    ASSESSMENT:    1. DOE (dyspnea on exertion)   2. LBBB (left bundle branch block)   3. Essential hypertension   4. Hypercholesterolemia      PLAN:  In order of problems listed above:  1. The patient will undergo 2-D Doppler echocardiography to rule out systolic dysfunction since left bundle branch block is now permanent on EKG. Additionally, a coronary CT angiogram will be performed to identify significant coronary obstruction and assess pulmonary architecture.  2. Previously rate related bundle, now appears to be permanent. No specific evaluation at this time. 3. Exercise treadmill test to assess blood pressure control with physical activity. We are doing this to exclude the possibility that there is systolic pressure overload accounting for symptoms. 4. The CT scan will help determine if there is significant coronary plaque, and is noted aggressive risk factor modification will need to be undertaken.   Overall plan is to  rule out obstructive coronary disease, perhaps missed on the last evaluation using myocardial scintigraphy because of "matched ischemia". We'll also reevaluate residual LV function of the left bundle-branch block is present.   Medication Adjustments/Labs and Tests Ordered: Current medicines are reviewed at length with the patient today.  Concerns regarding medicines are outlined above.  Medication changes, Labs and Tests ordered today are listed in the Patient Instructions below. Patient Instructions  Medication Instructions:  None  Labwork: None  Testing/Procedures: Your physician has requested that you have an exercise tolerance test. For further information please visit [email or web address]. Please also follow instruction sheet, as given.  Your physician has requested that you have an echocardiogram. Echocardiography is a painless test that uses sound waves to create images of your heart. It provides your doctor with information about the size and shape of your heart and how well your heart's chambers and valves are working. This procedure takes approximately one hour. There are no restrictions for this procedure.  Your physician has requested that you have cardiac CT. Cardiac computed tomography (CT) is a painless test that uses an x-ray machine to take clear, detailed pictures of your heart. For further information please visit [email or web address]. Please follow instruction sheet as given.   Follow-Up: Your physician recommends that you schedule a follow-up appointment 1 month after testing is completed.    Any Other Special Instructions Will Be Listed Below (If Applicable).     If you need a refill on your cardiac medications before your next appointment, please call your pharmacy.      Signed, Sinclair Grooms, MD  12/08/2016 5:31 PM    [address], [place], [place]  [address] Phone: [phone]; Fax: [phone]

## 2016-12-12 DIAGNOSIS — H9313 Tinnitus, bilateral: Secondary | ICD-10-CM | POA: Diagnosis not present

## 2016-12-12 DIAGNOSIS — J310 Chronic rhinitis: Secondary | ICD-10-CM | POA: Diagnosis not present

## 2016-12-12 DIAGNOSIS — H903 Sensorineural hearing loss, bilateral: Secondary | ICD-10-CM | POA: Diagnosis not present

## 2016-12-12 DIAGNOSIS — Z7289 Other problems related to lifestyle: Secondary | ICD-10-CM | POA: Diagnosis not present

## 2016-12-14 MED FILL — ESOMEPRAZOLE MAG DR 40 MG C: 40 | 90 days supply | Qty: 90 | Fill #2

## 2016-12-15 MED FILL — clonazePAM 1 MG TABS: 1 | 30 days supply | Qty: 30 | Fill #0

## 2016-12-18 ENCOUNTER — Other Ambulatory Visit: Payer: Self-pay | Admitting: Internal Medicine

## 2016-12-18 MED FILL — LINZESS 145 MCG CAPSULE: 145 | 30 days supply | Qty: 30 | Fill #0

## 2016-12-26 ENCOUNTER — Telehealth: Payer: Self-pay | Admitting: Interventional Cardiology

## 2016-12-26 NOTE — Telephone Encounter (Signed)
See previous notes.

## 2016-12-26 NOTE — Telephone Encounter (Signed)
Spoke with Emily Calhoun and informed her that yes we did received the go ahead to schedule the Cardiac CT [facility].  Per Caryl Pina in [place] with Ozella Almond @ [facility] [phone] - test will be under  Tier 2 no precert required.  I have faxed the order along with precert  to Central Scheduling @ [facility] [phone].  Per [facility] - once they received the order, it will take 24 hrs before they will call the patient to schedule test.   Patient is aware and was giving the number to call if she has not heard from them in 24 hrs.

## 2016-12-26 NOTE — Telephone Encounter (Signed)
Pt states she is waiting for referral from Dr Tamala Julian  to [facility] for Cardiac CT. Ivin Booty has been working on this, I will forward to [place] to follow-up with patient.

## 2016-12-26 NOTE — Telephone Encounter (Signed)
Patient in regards to a referral for a cardiac CT to be sent to [facility] because [facility] machine is currently down. Patient states that [facility] has not received paperwork. Please call to discuss, thanks.

## 2016-12-28 ENCOUNTER — Other Ambulatory Visit: Payer: Self-pay

## 2016-12-28 ENCOUNTER — Ambulatory Visit (INDEPENDENT_AMBULATORY_CARE_PROVIDER_SITE_OTHER): Payer: 59

## 2016-12-28 ENCOUNTER — Ambulatory Visit (HOSPITAL_COMMUNITY): Payer: 59 | Attending: Internal Medicine

## 2016-12-28 DIAGNOSIS — I071 Rheumatic tricuspid insufficiency: Secondary | ICD-10-CM | POA: Insufficient documentation

## 2016-12-28 DIAGNOSIS — I10 Essential (primary) hypertension: Secondary | ICD-10-CM | POA: Diagnosis not present

## 2016-12-28 DIAGNOSIS — J45909 Unspecified asthma, uncomplicated: Secondary | ICD-10-CM | POA: Diagnosis not present

## 2016-12-28 DIAGNOSIS — Z8541 Personal history of malignant neoplasm of cervix uteri: Secondary | ICD-10-CM | POA: Insufficient documentation

## 2016-12-28 DIAGNOSIS — E785 Hyperlipidemia, unspecified: Secondary | ICD-10-CM | POA: Diagnosis not present

## 2016-12-28 DIAGNOSIS — Z8249 Family history of ischemic heart disease and other diseases of the circulatory system: Secondary | ICD-10-CM | POA: Diagnosis not present

## 2016-12-28 DIAGNOSIS — R0609 Other forms of dyspnea: Secondary | ICD-10-CM | POA: Insufficient documentation

## 2016-12-28 LAB — EXERCISE TOLERANCE TEST
CHL RATE OF PERCEIVED EXERTION: 17
CSEPED: 9 min
CSEPEDS: 0 s
CSEPEW: 10.1 METS
CSEPPHR: 160 {beats}/min
MPHR: 159 {beats}/min
Percent HR: 100 %
Rest HR: 70 {beats}/min

## 2017-01-04 DIAGNOSIS — R0609 Other forms of dyspnea: Secondary | ICD-10-CM | POA: Diagnosis not present

## 2017-01-05 MED FILL — LEVOTHYROXINE 75 MCG TABLET: 75 | 90 days supply | Qty: 90 | Fill #2

## 2017-01-05 MED FILL — diazePAM 10 MG TABS: 10 | 1 days supply | Qty: 1 | Fill #0

## 2017-01-05 NOTE — Telephone Encounter (Signed)
Called patient back. Consulted Dr. Saunders Revel (DOD), since Dr. Tamala Julian is not in the office. Informed patient she would need to call her PCP for medication to calm her rage from the steroids. Informed patient that the cardiologist usually does not prescribe  medications for calming from taking steroids. Encouraged patient to call her PCP. Patient verbalized understanding.

## 2017-01-05 NOTE — Telephone Encounter (Signed)
New Message   (McNeal gave her this meds) Pt had allergic reaction to the dye for test, the steroids that she was given for the allergic reaction , gave her roid rage, she would like something that would calm her down , please call ...she said the steroids are making her very depressed and anxious and she feels likes she is jumping out of her skin.

## 2017-01-08 ENCOUNTER — Telehealth: Payer: Self-pay | Admitting: Interventional Cardiology

## 2017-01-08 MED ORDER — PREDNISONE 50 MG PO TABS: ORAL_TABLET | ORAL | 0 refills | Status: DC

## 2017-01-08 MED FILL — predniSONE 50 MG TABS: 50 | 1 days supply | Qty: 3 | Fill #0

## 2017-01-08 NOTE — Telephone Encounter (Signed)
Agree 

## 2017-01-08 NOTE — Telephone Encounter (Addendum)
Spoke with pt and she went in for CT last week at [facility] and had an allergic reaction to the dye.  They rescheduled her for tomorrow at 11AM and told her she would need a dose pack of medication to start 13hrs prior to the test and this would need to come from Dr. Tamala Julian.  Pt states she also needs Valium prior to the procedure.  She would like medications sent to [facility] on [address] I would send message to Dr. Tamala Julian for review and advisement.  Pt appreciative for call.

## 2017-01-08 NOTE — Telephone Encounter (Signed)
Patient calling, states that she had a CT scan at [facility] and was told that they would call in the script because she had an allergic reaction. Patient was then told by [facility] that Dr. Tamala Julian would need to call in the script , [facility] and states that she must have it 13 hours prior to her procedure.

## 2017-01-08 NOTE — Addendum Note (Signed)
Addended by: Loren Racer on: 01/08/2017 11:29 AM   Modules accepted: Orders

## 2017-01-08 NOTE — Telephone Encounter (Signed)
Spoke with Dr. Tamala Julian and Claiborne Billings, [facility] and they ordered Prednisone 50mg - 1 tab 13hrs, 7hrs and 1 hr prior to CT and Benadryl 50mg  one hr prior to CT.  Dr. Tamala Julian said ok for pt to have Valium 5mg  at least 36mins prior to CT.  Pt does have someone to drive her there and back since she will be on Valium.  Spoke with pt and went over orders per Dr. Tamala Julian and [facility].  Pt states our office had told her she would need to get something to calm her down from her PCP so she already has the Valium.  Pt verbalized understanding and instructions for other medications.  Pt appreciative for call.

## 2017-01-09 ENCOUNTER — Encounter: Payer: Self-pay | Admitting: Interventional Cardiology

## 2017-01-09 DIAGNOSIS — R0609 Other forms of dyspnea: Secondary | ICD-10-CM | POA: Diagnosis not present

## 2017-01-18 ENCOUNTER — Telehealth: Payer: Self-pay | Admitting: *Deleted

## 2017-01-18 NOTE — Telephone Encounter (Signed)
Left message to call back  

## 2017-01-18 NOTE — Telephone Encounter (Signed)
-----   Message from Belva Crome, MD sent at 01/16/2017  8:25 PM EDT ----- Regarding: Cor CTA Coronary study is normal. Heart structure is normal. ----- Message ----- From: Loren Racer, LPN Sent: 07/22/1094   1:21 PM To: Belva Crome, MD  CT is available in Care Everywhere.

## 2017-01-19 NOTE — Telephone Encounter (Signed)
Follow up  ° ° ° °Pt is returning call to Jennifer. °

## 2017-01-19 NOTE — Telephone Encounter (Signed)
Spoke with pt and went over results.  [facility] sent [facility] a print out of results and it mentions calcified hilar lymph nodes.  Pt concerned about this and wants to come to appt and speak with Dr. Tamala Julian about it.  Pt appreciative for call.

## 2017-01-22 DIAGNOSIS — H903 Sensorineural hearing loss, bilateral: Secondary | ICD-10-CM | POA: Diagnosis not present

## 2017-01-25 ENCOUNTER — Encounter (INDEPENDENT_AMBULATORY_CARE_PROVIDER_SITE_OTHER): Payer: Self-pay

## 2017-01-25 ENCOUNTER — Ambulatory Visit (INDEPENDENT_AMBULATORY_CARE_PROVIDER_SITE_OTHER): Payer: 59 | Admitting: Interventional Cardiology

## 2017-01-25 ENCOUNTER — Encounter: Payer: Self-pay | Admitting: Interventional Cardiology

## 2017-01-25 VITALS — BP 114/84 | HR 79 | Ht 62.0 in | Wt 156.0 lb

## 2017-01-25 DIAGNOSIS — R000 Tachycardia, unspecified: Secondary | ICD-10-CM

## 2017-01-25 DIAGNOSIS — R0609 Other forms of dyspnea: Secondary | ICD-10-CM

## 2017-01-25 DIAGNOSIS — J4520 Mild intermittent asthma, uncomplicated: Secondary | ICD-10-CM | POA: Diagnosis not present

## 2017-01-25 DIAGNOSIS — R002 Palpitations: Secondary | ICD-10-CM | POA: Diagnosis not present

## 2017-01-25 DIAGNOSIS — R0600 Dyspnea, unspecified: Secondary | ICD-10-CM | POA: Insufficient documentation

## 2017-01-25 DIAGNOSIS — I447 Left bundle-branch block, unspecified: Secondary | ICD-10-CM | POA: Diagnosis not present

## 2017-01-25 NOTE — Patient Instructions (Signed)
Medication Instructions:  None  Labwork: None  Testing/Procedures: Your physician has recommended that you wear an event monitor. Event monitors are medical devices that record the heart's electrical activity. Doctors most often us these monitors to diagnose arrhythmias. Arrhythmias are problems with the speed or rhythm of the heartbeat. The monitor is a small, portable device. You can wear one while you do your normal daily activities. This is usually used to diagnose what is causing palpitations/syncope (passing out).   Follow-Up: Your physician recommends that you schedule a follow-up appointment as needed with Dr. Smith.   Any Other Special Instructions Will Be Listed Below (If Applicable).     If you need a refill on your cardiac medications before your next appointment, please call your pharmacy.   

## 2017-01-25 NOTE — Progress Notes (Signed)
Cardiology Office Note    Date:  01/25/2017   ID:  AYNSLEY FLEET, DOB 04-Dec-1955, MRN [id number]  PCP:  Maurice Small, MD  Cardiologist: Sinclair Grooms, MD   Chief Complaint  Patient presents with  . Shortness of Breath  . Palpitations    History of Present Illness:  Emily Calhoun is a 61 y.o. 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  Complaints of dyspnea were evaluated with an echocardiogram and coronary CT angiogram. Both studies were normal. Calcium score was 0. EF 60%.  Now her major complaint is increasingly frequent palpitations. This has waxed and waned over the years and recently become more concerning. It occurs when at rest and are trying to fall asleep. No syncope associated.  Past Medical History:  Diagnosis Date  . Anal fissure   . Anxiety   . Asthma   . Asthma   . Autoimmune disease ([place])   . Cervical cancer [facility])    mom took DES  . Hepatitis A   . History of kidney stones   . Hyperlipidemia   . 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 IBS (irritable bowel syndrome)   . Migraine   . Migraine   . STD (sexually transmitted disease)    HSV II  . Sweet's disease   . Thyroid disease    hypothyroid    Past Surgical History:  Procedure Laterality Date  . ABDOMINAL HYSTERECTOMY  1988   ?retained ovaries for cx ca  . blephoplasty    . BREAST LUMPECTOMY Left 2009   benign; ?cyst  . BREAST REDUCTION SURGERY Bilateral 09/2010  . ELBOW SURGERY Right 1995   tendon repair  . ELBOW SURGERY Right   . EXCISION VAGINAL CYST  10-25-2009   benign squamous cyst  . FOOT SURGERY Right   . internal hemorrhoidectomy    . NASAL SINUS SURGERY     x 2    Current Medications: Outpatient Medications Prior to Visit  Medication Sig Dispense Refill  . acyclovir (ZOVIRAX) 400 MG tablet Take 1 tablet (400 mg total) by mouth daily. 90 tablet 4  . albuterol (PROAIR HFA) 108 (90  BASE) MCG/ACT inhaler Inhale 2 puffs into the lungs every 6 (six) hours as needed for wheezing or shortness of breath. 1 Inhaler 3  . escitalopram (LEXAPRO) 20 MG tablet Take 20 mg by mouth daily.   3  . esomeprazole (NEXIUM) 40 MG capsule Take 1 capsule (40 mg total) by mouth daily. 30 capsule 3  . estradiol (VIVELLE-DOT) 0.075 MG/24HR PLACE 1 PATCH ONTO THE SKIN 2 TIMES A WEEK.    Marland Kitchen levothyroxine (SYNTHROID, LEVOTHROID) 75 MCG tablet Take 75 mcg by mouth daily before breakfast.    . Lifitegrast (XIIDRA) 5 % SOLN Place 1 drop into both eyes 2 (two) times daily.     . Linaclotide (LINZESS) 72 MCG CAPS Take 72 mcg by mouth daily. 30 capsule 3  . Multiple Vitamins-Minerals (MULTIVITAMIN WITH MINERALS) tablet Take 1 tablet by mouth daily.     . bacitracin-polymyxin b (POLYSPORIN) ophthalmic ointment Reported on 11/22/2015    . clonazePAM (KLONOPIN) 1 MG tablet TAKE 1/2-1 TABLET AT BEDTIME as needed  0  . LINZESS 145 MCG CAPS capsule TAKE 1 CAPSULE BY MOUTH DAILY AS NEEDED. (Patient not taking: Reported on 01/25/2017) 30 capsule 2  . predniSONE (DELTASONE) 50 MG tablet Take 1 tablet by mouth 13 hours prior  to test, 1 tablet by mouth 7 hours prior to test and 1 tablet 1 hour prior to test, then discontinue. (Patient not taking: Reported on 01/25/2017) 3 tablet 0   No facility-administered medications prior to visit.      Allergies:   Azithromycin; Cephalexin; Cephalosporins; Metronidazole; Penicillins; Sulfonamide derivatives; Clarithromycin; Effexor [venlafaxine]; Gentamicin sulfate; Ketoconazole; Macrobid [nitrofurantoin]; Morphine sulfate; Singulair [montelukast sodium]; Tobramycin-dexamethasone; and Wellbutrin [bupropion]   Social History   Social History  . Marital status: Married    Spouse name: N/A  . Number of children: 2  . Years of education: N/A   Occupational History  . Unemployed Unemployed   Social History Main Topics  . Smoking status: Never Smoker  . Smokeless tobacco: Never  Used  . Alcohol use 4.8 oz/week    8 Standard drinks or equivalent per week     Comment: 2 glasses wine nightly.    . Drug use: No  . Sexual activity: Yes    Partners: Male    Birth control/ protection: Surgical     Comment: TAH   Other Topics Concern  . None   Social History Narrative   Lives with husband in a 2 story home.  Has 2 children.     Works as an Futures trader.    Education: college.     Family History:  The patient's family history includes Allergies in her mother; Clotting disorder in her father; Diabetes in her father and sister; Heart disease in her father and mother; Other in her son; Thyroid disease in her father and sister.   ROS:   Please see the history of present illness.    Seems to have anxiety about multiple things including her family history of premature atherosclerosis. Decreased hearing. Rash. Also concerned about lipids and calcification noted in the hila bilaterally on the CT scan.  All other systems reviewed and are negative.   PHYSICAL EXAM:   VS:  BP 114/84 (BP Location: Left Arm)   Pulse 79   Ht 5\' 2"  (1.575 m)   Wt 156 lb (70.8 kg)   LMP 07/17/1986   BMI 28.53 kg/m    GEN: Well nourished, well developed, in no acute distress  HEENT: normal  Neck: no JVD, carotid bruits, or masses Cardiac: RRR; no murmurs, rubs, or gallops,no edema  Respiratory:  clear to auscultation bilaterally, normal work of breathing GI: soft, nontender, nondistended, + BS MS: no deformity or atrophy  Skin: warm and dry, no rash Neuro:  Alert and Oriented x 3, Strength and sensation are intact Psych: euthymic mood, full affect  Wt Readings from Last 3 Encounters:  01/25/17 156 lb (70.8 kg)  12/08/16 154 lb 6.4 oz (70 kg)  06/01/16 155 lb 4 oz (70.4 kg)      Studies/Labs Reviewed:   EKG:  EKG  Not repeated. Known LBBB  Recent Labs: 06/01/2016: ALT 17; BUN 19; Creatinine, Ser 0.70; Hemoglobin 14.1; Platelets 274.0; Potassium 4.0; Sodium 135   Lipid  Panel No results found for: CHOL, TRIG, HDL, CHOLHDL, VLDL, LDLCALC, LDLDIRECT  Additional studies/ records that were reviewed today include:   Coronary CT angiogram at [facility] July 2018:  1.Negative for coronary artery disease.Patients total coronary artery calcium score is 0, which is 0 percentile for patients of matched age, gender and race/ethnicity. 2.Right coronary artery dominance. 3.There are calcified hilar lymph nodes bilaterally.   ECHOCARDIOGRAPHY 2018: ------------------------------------------------------------------- Study Conclusions  - Left ventricle: The cavity size was normal. Wall thickness  was   normal. Systolic function was normal. The estimated ejection   fraction was in the range of 55% to 60%. Features are consistent   with a pseudonormal left ventricular filling pattern, with   concomitant abnormal relaxation and increased filling pressure   (grade 2 diastolic dysfunction).  Exercise treadmill tests 12/28/2016: Study Highlights     Blood pressure demonstrated a normal response to exercise.  There was no ST segment deviation noted during stress.   Baseline ECG with lateral T wave changes and IVCD Patient developed LBBB with stress and rare PVC;s Non diagnostic ETT Suggest f/u lexiscan myovue     ASSESSMENT:    1. Dyspnea on exertion   2. Palpitations   3. Tachycardia   4. Mild intermittent asthma without complication   5. LBBB (left bundle branch block)      PLAN:  In order of problems listed above:  1. Uncertain etiology of dyspnea. Has normal LV function, cardiac structure, and no evidence of coronary disease. She does have some evidence of diastolic dysfunction which could contribute to this complaint. No specific therapy is indicated. 2. Again complains of palpitations and at times episodes of racing heart. Thirty-day continuous monitor will be done to rule out atrial fibrillation and/or  ventricular arrhythmias. 3. See above 4. Not addressed 5. Palpitations could be related to known conduction abnormality.  With reference to dyspnea, encourage continued aerobic activity. She will speak with her primary physician concerning her lipids. I believe nonpharmacologic therapy is reasonable at this time given the normal coronary CTA. The 30 day monitor will help exclude sustained arrhythmia such as atrial fibrillation. She has conduction system disease noted by left bundle branch block..  Also concerned about hilar calcification noted on CT. I explained to her this is likely the result of prior inflammatory process. She should speak to primary care concerning this.   We'll see on an as-needed basis after the results of the 30 day monitor are reported.    Medication Adjustments/Labs and Tests Ordered: Current medicines are reviewed at length with the patient today.  Concerns regarding medicines are outlined above.  Medication changes, Labs and Tests ordered today are listed in the Patient Instructions below. Patient Instructions  Medication Instructions:  None  Labwork: None  Testing/Procedures: Your physician has recommended that you wear an event monitor. Event monitors are medical devices that record the heart's electrical activity. Doctors most often [name] these monitors to diagnose arrhythmias. Arrhythmias are problems with the speed or rhythm of the heartbeat. The monitor is a small, portable device. You can wear one while you do your normal daily activities. This is usually used to diagnose what is causing palpitations/syncope (passing out).   Follow-Up: Your physician recommends that you schedule a follow-up appointment as needed with Dr. Tamala Julian.    Any Other Special Instructions Will Be Listed Below (If Applicable).     If you need a refill on your cardiac medications before your next appointment, please call your pharmacy.      Signed, Sinclair Grooms, MD    01/25/2017 11:48 AM    [address], [place], [place]  [address] Phone: [phone]; Fax: [phone]

## 2017-02-06 ENCOUNTER — Ambulatory Visit (INDEPENDENT_AMBULATORY_CARE_PROVIDER_SITE_OTHER): Payer: 59

## 2017-02-06 DIAGNOSIS — R002 Palpitations: Secondary | ICD-10-CM | POA: Diagnosis not present

## 2017-02-06 DIAGNOSIS — R000 Tachycardia, unspecified: Secondary | ICD-10-CM

## 2017-02-06 MED FILL — ACYCLOVIR 400 MG TABLET: 400 | 90 days supply | Qty: 90 | Fill #2

## 2017-02-06 MED FILL — ESCITALOPRAM 20 MG TABLET: 20 | 90 days supply | Qty: 90 | Fill #2

## 2017-02-08 ENCOUNTER — Telehealth: Payer: Self-pay | Admitting: Interventional Cardiology

## 2017-02-08 NOTE — Telephone Encounter (Addendum)
Spoke with Kathlee Nations from [employer]. She states that they received a critical report from patient's monitor. She states that patient was in SVT sustained for 1 minute at 161 bpm at 12:18 PM CT. She states that the patient was contacted and states that she was outside horseback riding and was SOB. She states that the patient denied having any other symptoms. Kathlee Nations states that she will upload the report to their website. Will wait for report and review with DOD.

## 2017-02-08 NOTE — Telephone Encounter (Signed)
Received report form [employer]. Report reviewed with DOD Dr. Angelena Form. No changes at this time. Report placed in Dr. Thompson Caul box for review.

## 2017-02-08 NOTE — Telephone Encounter (Signed)
New message:      Critical EKG 

## 2017-02-09 MED ORDER — METOPROLOL TARTRATE 25 MG PO TABS: 12.5000 mg | ORAL_TABLET | Freq: Two times a day (BID) | ORAL | 3 refills | Status: DC

## 2017-02-09 MED FILL — METOPROLOL TARTRATE 25 MG T: 25 | 30 days supply | Qty: 30 | Fill #0

## 2017-02-09 NOTE — Telephone Encounter (Signed)
Reviewed with patient.   She is out of town and will be back to start medication Sat evening.  Educated on metoprolol.   She states her BP runs low.  At last OV it was 114/84.  Discussed the small dosage but if develops fatigue, any lightheadedness, check BP and call if needed.  She is also traveling next week and is agreeable to remain in contact by phone for now.  She would prefer to see Dr. Tamala Julian if possible for follow up.  She is aware that Dr. Tamala Julian and his nurse are out of the office next week but that she can call and speak with triage nurses anytime.

## 2017-02-09 NOTE — Telephone Encounter (Signed)
Received fax copy of Day 3 serious event on event monitor.  170 bpm at 11:28 am yesterday as well.   SVT (1 min) w/ PACS/artifact.     I reviewed this for signature with Dr. Saunders Revel (DOD) who recommends since she had a couple episodes that she start metoprolol tartrate 12.5 mg BID and schedule appointment to reassess.    Left voice mail for patient (both numbers) to call back.   There is very limited availability next week for appointments.  I have messaged the [place] office (where patient lives) to see if she could be scheduled there Wed 8/1 with Ermalinda Barrios.

## 2017-02-12 ENCOUNTER — Telehealth: Payer: Self-pay | Admitting: *Deleted

## 2017-02-12 NOTE — Telephone Encounter (Signed)
[employer] sent serious recording  Svt at a rate of 174.4 . Spoke with pt has some sob and just started metoprolol yesterday . Discussed with  Chanetta Marshall  NP needs a f/u .Reveiwed pt's chart no f/u scheduled and pt only wants to see Dr Tamala Julian.Appt made for 02-23-17 at 11:40 am

## 2017-02-14 NOTE — Telephone Encounter (Signed)
okay

## 2017-02-15 ENCOUNTER — Telehealth: Payer: Self-pay | Admitting: Interventional Cardiology

## 2017-02-15 NOTE — Telephone Encounter (Signed)
New message    [employer] calling

## 2017-02-15 NOTE — Telephone Encounter (Signed)
Patient returning call. Patient states that she did not pass out. She states that she was having SOB. She states that she was running at the time. Strip reviewed with DOD. Per DOD. Report did not show adverse rhythm. Report given to Medical Records to be scanned into the patient's chart.

## 2017-02-15 NOTE — Telephone Encounter (Signed)
Crystal from [employer] and states that they received an alert the button was pushed on the patint's monitor stating that the patient was SOB and passed out. [employer] was unable to contact patient. Crystal states that the monitor showed ST with a rate of 119-136. She states that the episode was at 10:11 AM CT. Crystal stated that the report will be uploaded to the website.  Attempted to contact patient on home and cell phone. There was no answer. Left a message for patient to call back.

## 2017-02-20 DIAGNOSIS — R208 Other disturbances of skin sensation: Secondary | ICD-10-CM | POA: Diagnosis not present

## 2017-02-20 MED FILL — GABAPENTIN 300 MG CAPS: 300 | 30 days supply | Qty: 90 | Fill #0

## 2017-02-22 DIAGNOSIS — I471 Supraventricular tachycardia, unspecified: Secondary | ICD-10-CM | POA: Insufficient documentation

## 2017-02-22 NOTE — Progress Notes (Signed)
Cardiology Office Note    Date:  02/23/2017   ID:  Emily, Calhoun Feb 03, 1956, MRN [id number]  PCP:  Maurice Small, MD  Cardiologist: Sinclair Grooms, MD   Chief Complaint  Patient presents with  . Follow-up    PSVT    History of Present Illness:  Emily Calhoun is a 61 y.o. female with extensive cardiac workup for coronary disease which is negative (CT angios and myocardial perfusion stress imaging), normal left ventricular systolic function, chronic left bundle branch block, and recent identification of wide complex tachycardia with self termination noted on continuous monitoring. Tachycardia is felt to represent SVT based upon morphology.  Very low-dose beta blocker therapy has been started in the form of metoprolol 12.5 mg twice a day. Episodes of palpitation or fluttering have markedly decreased with the addition of therapy. The patient is high anxiety high stress emotional state. She is very physically active. She has excellent exercise tolerance noted on stress testing. Since starting metoprolol she feels somewhat tired but acceptable. No episodes of syncope or near-syncope.  Past Medical History:  Diagnosis Date  . Anal fissure   . Anxiety   . Asthma   . Asthma   . Autoimmune disease ([place])   . Cervical cancer [facility])    mom took DES  . Hepatitis A   . History of kidney stones   . Hyperlipidemia   . 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 IBS (irritable bowel syndrome)   . Migraine   . Migraine   . STD (sexually transmitted disease)    HSV II  . Sweet's disease   . Thyroid disease    hypothyroid    Past Surgical History:  Procedure Laterality Date  . ABDOMINAL HYSTERECTOMY  1988   ?retained ovaries for cx ca  . blephoplasty    . BREAST LUMPECTOMY Left 2009   benign; ?cyst  . BREAST REDUCTION SURGERY Bilateral 09/2010  . ELBOW SURGERY Right 1995   tendon repair  . ELBOW SURGERY Right   . EXCISION VAGINAL CYST  10-25-2009   benign squamous cyst  .  FOOT SURGERY Right   . internal hemorrhoidectomy    . NASAL SINUS SURGERY     x 2    Current Medications: Outpatient Medications Prior to Visit  Medication Sig Dispense Refill  . acyclovir (ZOVIRAX) 400 MG tablet Take 1 tablet (400 mg total) by mouth daily. 90 tablet 4  . albuterol (PROAIR HFA) 108 (90 BASE) MCG/ACT inhaler Inhale 2 puffs into the lungs every 6 (six) hours as needed for wheezing or shortness of breath. 1 Inhaler 3  . clonazePAM (KLONOPIN) 1 MG tablet Take 0.5 to 1 mg by mouth daily at bedtime as needed for sleep.    Marland Kitchen escitalopram (LEXAPRO) 20 MG tablet Take 20 mg by mouth daily.   3  . esomeprazole (NEXIUM) 40 MG capsule Take 1 capsule (40 mg total) by mouth daily. 30 capsule 3  . estradiol (VIVELLE-DOT) 0.075 MG/24HR PLACE 1 PATCH ONTO THE SKIN 2 TIMES A WEEK.    Marland Kitchen levothyroxine (SYNTHROID, LEVOTHROID) 75 MCG tablet Take 75 mcg by mouth daily before breakfast.    . Lifitegrast (XIIDRA) 5 % SOLN Place 1 drop into both eyes 2 (two) times daily.     . Linaclotide (LINZESS) 72 MCG CAPS Take 72 mcg by mouth daily. 30 capsule 3  . metoprolol tartrate (LOPRESSOR) 25 MG tablet Take 0.5 tablets (12.5 mg total) by mouth 2 (two)  times daily. 30 tablet 3  . Multiple Vitamins-Minerals (MULTIVITAMIN WITH MINERALS) tablet Take 1 tablet by mouth daily.      No facility-administered medications prior to visit.      Allergies:   Azithromycin; Cephalexin; Cephalosporins; Metronidazole; Penicillins; Sulfonamide derivatives; Clarithromycin; Effexor [venlafaxine]; Gentamicin sulfate; Ketoconazole; Macrobid [nitrofurantoin]; Morphine sulfate; Singulair [montelukast sodium]; Tobramycin-dexamethasone; and Wellbutrin [bupropion]   Social History   Social History  . Marital status: Married    Spouse name: N/A  . Number of children: 2  . Years of education: N/A   Occupational History  . Unemployed Unemployed   Social History Main Topics  . Smoking status: Never Smoker  . Smokeless  tobacco: Never Used  . Alcohol use 4.8 oz/week    8 Standard drinks or equivalent per week     Comment: 2 glasses wine nightly.    . Drug use: No  . Sexual activity: Yes    Partners: Male    Birth control/ protection: Surgical     Comment: TAH   Other Topics Concern  . None   Social History Narrative   Lives with husband in a 2 story home.  Has 2 children.     Works as an Futures trader.    Education: college.     Family History:  The patient's family history includes Allergies in her mother; Clotting disorder in her father; Diabetes in her father and sister; Heart disease in her father and mother; Other in her son; Thyroid disease in her father and sister.   ROS:   Please see the history of present illness.    Decreased hearing, unexplained weight gain, muscle pain, easy bruising, leg pain, and snoring. Sleep apnea as never been excluded.  All other systems reviewed and are negative.   PHYSICAL EXAM:   VS:  BP 128/70 (BP Location: Right Arm)   Pulse 63   Ht 5\' 2"  (1.575 m)   Wt 155 lb 6.4 oz (70.5 kg)   LMP 07/17/1986   BMI 28.42 kg/m    GEN: Well nourished, well developed, in no acute distress  HEENT: normal  Neck: no JVD, carotid bruits, or masses Cardiac: RRR; no murmurs, rubs, or gallops,no edema  Respiratory:  clear to auscultation bilaterally, normal work of breathing GI: soft, nontender, nondistended, + BS MS: no deformity or atrophy  Skin: warm and dry, no rash Neuro:  Alert and Oriented x 3, Strength and sensation are intact Psych: euthymic mood, full affect  Wt Readings from Last 3 Encounters:  02/23/17 155 lb 6.4 oz (70.5 kg)  01/25/17 156 lb (70.8 kg)  12/08/16 154 lb 6.4 oz (70 kg)      Studies/Labs Reviewed:   EKG:  EKG  Not repeated.  Recent Labs: 06/01/2016: ALT 17; BUN 19; Creatinine, Ser 0.70; Hemoglobin 14.1; Platelets 274.0; Potassium 4.0; Sodium 135   Lipid Panel No results found for: CHOL, TRIG, HDL, CHOLHDL, VLDL, LDLCALC,  LDLDIRECT  Additional studies/ records that were reviewed today include:  Reviewed transmitted strips from 02/06/2017 at 11:55 AM revealed wide complex regular tachycardia 161 bpm with QRS morphology similar to baseline left bundle-branch block. Also an episode transmitted on 02/06/2017 at 3:50 PM demonstrated wide complex tachycardia at 174 bpm with different morphology than baseline left bundle.    ASSESSMENT:    1. PSVT (paroxysmal supraventricular tachycardia) ([place])   2. LBBB (left bundle branch block)   3. Essential hypertension   4. Hypercholesterolemia   5. Palpitations      PLAN:  In order of problems listed above:  1. Documented wide complex tachycardia at rates between 160 and 170 bpm felt to represent PSVT. Low-dose metoprolol 12.5 mg twice daily has been started with a dramatic improvement and complaints. She continues to whether monitor. Plan interpretation of the 30 day monitor and clinical follow-up in 3 months. Call if symptoms. We discussed in atrophy history of PSVT also in light of the patient's left bundle branch block. We discussed the possibility that if medication does not simply control the rhythm that ablation may be possible. Also cautioned that with evidence of degenerative conduction system disease been noted by left bundle, medication and pacemaker therapy may also be a consideration. 2. Not addressed other than as above 3. Excellent control on current medical therapy 4. Not addressed 5. Now known to be secondary to the above.   Three-month clinical follow-up. Report recurrent symptoms. Call if other questions. Continue same therapy.  Prolonged office visit to greater than 50% of the time spent in counseling considering the natural history of her underlying process, treatment options, and prognosis.  Medication Adjustments/Labs and Tests Ordered: Current medicines are reviewed at length with the patient today.  Concerns regarding medicines are outlined  above.  Medication changes, Labs and Tests ordered today are listed in the Patient Instructions below. Patient Instructions  Medication Instructions:  None  Labwork: None  Testing/Procedures: None  Follow-Up: Your physician recommends that you schedule a follow-up appointment in: 3 months with Dr. Tamala Julian.    Any Other Special Instructions Will Be Listed Below (If Applicable).     If you need a refill on your cardiac medications before your next appointment, please call your pharmacy.      Signed, Sinclair Grooms, MD  02/23/2017 12:40 PM    [address], [place], [place]  [address] Phone: [phone]; Fax: [phone]

## 2017-02-23 ENCOUNTER — Ambulatory Visit (INDEPENDENT_AMBULATORY_CARE_PROVIDER_SITE_OTHER): Payer: 59 | Admitting: Interventional Cardiology

## 2017-02-23 ENCOUNTER — Encounter: Payer: Self-pay | Admitting: Interventional Cardiology

## 2017-02-23 VITALS — BP 128/70 | HR 63 | Ht 62.0 in | Wt 155.4 lb

## 2017-02-23 DIAGNOSIS — I10 Essential (primary) hypertension: Secondary | ICD-10-CM

## 2017-02-23 DIAGNOSIS — R002 Palpitations: Secondary | ICD-10-CM

## 2017-02-23 DIAGNOSIS — E7800 Pure hypercholesterolemia, unspecified: Secondary | ICD-10-CM | POA: Diagnosis not present

## 2017-02-23 DIAGNOSIS — I447 Left bundle-branch block, unspecified: Secondary | ICD-10-CM | POA: Diagnosis not present

## 2017-02-23 DIAGNOSIS — I471 Supraventricular tachycardia: Secondary | ICD-10-CM

## 2017-02-23 NOTE — Patient Instructions (Signed)
Medication Instructions:  None  Labwork: None  Testing/Procedures: None  Follow-Up: Your physician recommends that you schedule a follow-up appointment in: 3 months with Dr. Smith.    Any Other Special Instructions Will Be Listed Below (If Applicable).     If you need a refill on your cardiac medications before your next appointment, please call your pharmacy.   

## 2017-03-09 MED FILL — ESOMEPRAZOLE MAG DR 40 MG C: 40 | 90 days supply | Qty: 90 | Fill #3

## 2017-03-09 MED FILL — METOPROLOL TARTRATE 25 MG T: 25 | 30 days supply | Qty: 30 | Fill #1

## 2017-03-21 ENCOUNTER — Telehealth: Payer: Self-pay | Admitting: *Deleted

## 2017-03-21 NOTE — Telephone Encounter (Signed)
Informed pt of monitor results.  Pt states she had problems with the monitor when wearing it and is positive she had more episodes of SVT.  Pt states she is still consistently feeling palps.  Yesterday HR got fairly high during exercise and even 10 mins after exercise HR was still 126. When HR is up pt generally doesn't feel well.  Pt wondering if Metoprolol should be increased?  Advised I would send message to [facility] for review and advisement.

## 2017-03-21 NOTE — Telephone Encounter (Signed)
-----   Message from Belva Crome, MD sent at 03/17/2017 12:32 PM EDT ----- Let the patient know two episodes of SVT were documented and have been discussed. A copy will be sent to Maurice Small, MD

## 2017-03-22 ENCOUNTER — Other Ambulatory Visit (HOSPITAL_COMMUNITY): Payer: Self-pay | Admitting: Orthopedic Surgery

## 2017-03-22 DIAGNOSIS — M25561 Pain in right knee: Secondary | ICD-10-CM | POA: Diagnosis not present

## 2017-03-22 DIAGNOSIS — M545 Low back pain: Secondary | ICD-10-CM | POA: Diagnosis not present

## 2017-03-22 NOTE — Telephone Encounter (Signed)
Increase metoprolol tartrate to 25 mg twice a day. We may eventually changed to metoprolol succinate 50 mg daily depending upon her response.

## 2017-03-22 NOTE — Telephone Encounter (Signed)
Left message to call back  

## 2017-03-23 MED ORDER — METOPROLOL TARTRATE 25 MG PO TABS
25.0000 mg | ORAL_TABLET | Freq: Two times a day (BID) | ORAL | 3 refills | Status: DC
Start: 2017-03-23 — End: 2017-09-20

## 2017-03-23 NOTE — Telephone Encounter (Signed)
Spoke with pt and went over recommendations per Dr. Tamala Julian.  Advised pt if sx continue to call the office.  Pt verbalized understanding and was in agreement with this plan.

## 2017-03-27 ENCOUNTER — Ambulatory Visit (HOSPITAL_COMMUNITY): Payer: 59

## 2017-03-28 ENCOUNTER — Telehealth: Payer: Self-pay | Admitting: Interventional Cardiology

## 2017-03-28 MED FILL — XIIDRA 5% EYE DROPS: 5 | 90 days supply | Qty: 180 | Fill #0

## 2017-03-28 MED FILL — GABAPENTIN 300 MG CAPSULE: 300 | 30 days supply | Qty: 90 | Fill #1

## 2017-03-28 MED FILL — LEVOTHYROXINE 75 MCG TABLET: 75 | 90 days supply | Qty: 90 | Fill #3

## 2017-03-28 NOTE — Telephone Encounter (Signed)
Refills for Lopressor were sent in to [facility] last week with new twice daily dosing directions. Patient wanted prescription sent to [facility]. I spoke with [place] and per the pharmacy they can just have the prescription transferred over from [facility]. Called patient back and let her know they will just have script transferred over. She thanked me for the call.

## 2017-03-28 NOTE — Telephone Encounter (Signed)
New message       *STAT* If patient is at the pharmacy, call can be transferred to refill team.   1. Which medications need to be refilled? (please list name of each medication and dose if known)  Metoprolol 25mg  bid 2. Which pharmacy/location (including street and city if local pharmacy) is medication to be sent to? [facility] at [address] 3. Do they need a 30 day or 90 day supply? 90----pt was taking 25mg  daily but now is taking 25mg  bid.  Therefore, she needs a new presc called in because it is too early to get a refill on the daily presc

## 2017-04-02 MED FILL — METOPROLOL TARTRATE 25 MG T: 25 | 30 days supply | Qty: 60 | Fill #0

## 2017-04-03 ENCOUNTER — Ambulatory Visit (HOSPITAL_COMMUNITY)
Admission: RE | Admit: 2017-04-03 | Discharge: 2017-04-03 | Disposition: A | Discharge status: Home / Self Care | Payer: 59 | Source: Ambulatory Visit | Attending: Orthopedic Surgery | Admitting: Orthopedic Surgery

## 2017-04-03 DIAGNOSIS — M25561 Pain in right knee: Secondary | ICD-10-CM | POA: Diagnosis not present

## 2017-04-05 DIAGNOSIS — M1711 Unilateral primary osteoarthritis, right knee: Secondary | ICD-10-CM | POA: Diagnosis not present

## 2017-04-18 ENCOUNTER — Ambulatory Visit: Payer: 59 | Admitting: Physical Therapy

## 2017-04-25 ENCOUNTER — Ambulatory Visit: Payer: 59 | Attending: Orthopedic Surgery | Admitting: Physical Therapy

## 2017-04-25 ENCOUNTER — Encounter: Payer: Self-pay | Admitting: Physical Therapy

## 2017-04-25 DIAGNOSIS — M62838 Other muscle spasm: Secondary | ICD-10-CM | POA: Insufficient documentation

## 2017-04-25 DIAGNOSIS — G8929 Other chronic pain: Secondary | ICD-10-CM | POA: Diagnosis not present

## 2017-04-25 DIAGNOSIS — M5441 Lumbago with sciatica, right side: Secondary | ICD-10-CM | POA: Diagnosis not present

## 2017-04-26 ENCOUNTER — Encounter: Payer: Self-pay | Admitting: Physical Therapy

## 2017-04-26 NOTE — Therapy (Signed)
[place], [place], [address] Phone: [phone]   Fax:  [phone]  Physical Therapy Evaluation  Patient Details  Name: Emily Calhoun MRN: [id number] Date of Birth: 1956/02/23 Referring Provider: Dr Elsie Saas   Encounter Date: 04/25/2017      PT End of Session - 04/26/17 1248    Visit Number 1   Number of Visits 16   Date for PT Re-Evaluation 06/21/17   Authorization Type [facility]    PT Start Time 1330   PT Stop Time 1415   PT Time Calculation (min) 45 min   Activity Tolerance Patient tolerated treatment well   Behavior During Therapy [facility] for tasks assessed/performed      Past Medical History:  Diagnosis Date  . Anal fissure   . Anxiety   . Asthma   . Asthma   . Autoimmune disease ([place])   . Cervical cancer [facility])    mom took DES  . Hepatitis A   . History of kidney stones   . Hyperlipidemia   . 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 IBS (irritable bowel syndrome)   . Migraine   . Migraine   . STD (sexually transmitted disease)    HSV II  . Sweet's disease   . Thyroid disease    hypothyroid    Past Surgical History:  Procedure Laterality Date  . ABDOMINAL HYSTERECTOMY  1988   ?retained ovaries for cx ca  . blephoplasty    . BREAST LUMPECTOMY Left 2009   benign; ?cyst  . BREAST REDUCTION SURGERY Bilateral 09/2010  . ELBOW SURGERY Right 1995   tendon repair  . ELBOW SURGERY Right   . EXCISION VAGINAL CYST  10-25-2009   benign squamous cyst  . FOOT SURGERY Right   . internal hemorrhoidectomy    . NASAL SINUS SURGERY     x 2    There were no vitals filed for this visit.       Subjective Assessment - 04/25/17 1344    Subjective Patient has a long history of lower back pain. Overt the past few weeks the pain has increased. she is now having bilateral sciatica. She likes to ride horses. She feels increased pain when she is riding horses.    Limitations  Standing;Walking   How long can you sit comfortably? increased pain the longer she sits but can be variable    How long can you stand comfortably? same as sitting    Diagnostic tests X-ray lumbar 2014: L3-L4 L4-L5 L5 S1 mild degeneration    Patient Stated Goals to have less pain    Currently in Pain? Yes   Pain Score 2   can get up to about an 8/9-10    Pain Location Back   Pain Orientation Right;Left  left sciatica worse then the right    Pain Onset More than a month ago   Pain Frequency Constant   Aggravating Factors  horse back riding; night time; walking for a long period of time.    Pain Relieving Factors ice    Effect of Pain on Daily Activities pain at night and when riding a horse             [facility] PT Assessment - 04/26/17 0001      Assessment   Medical Diagnosis Low back pain    Referring Provider Dr Elsie Saas    Onset Date/Surgical Date --  increased for the past few weeks  but chronic    Hand Dominance Right   Next MD Visit Does not know    Prior Therapy Hadtherapy last year for her back      Precautions   Precautions None   Precaution Comments n     Restrictions   Weight Bearing Restrictions No     Balance Screen   Has the patient fallen in the past 6 months Yes   How many times? 1  fell off her horse    Has the patient had a decrease in activity level because of a fear of falling?  No   Is the patient reluctant to leave their home because of a fear of falling?  No     Home Environment   Additional Comments Nothing pertinant      Prior Function   Level of Independence Independent   Vocation Full time employment   Vocation Requirements In front of a computer.    Leisure Horse back riding      Cognition   Overall Cognitive Status Within Functional Limits for tasks assessed   Attention Focused   Focused Attention Appears intact   Memory Appears intact   Awareness Appears intact   Problem Solving Appears intact     Observation/Other  Assessments   Observations Sits leaning to the left      Sensation   Light Touch Appears Intact   Additional Comments radiating pain mostly into the left leg to the knee      Coordination   Gross Motor Movements are Fluid and Coordinated Yes   Fine Motor Movements are Fluid and Coordinated Yes     Posture/Postural Control   Posture Comments lumbar hyper lordosis;      AROM   Lumbar Flexion limited 25% with pain    Lumbar Extension limited 25% with pain    Lumbar - Right Side Bend pain to the right    Lumbar - Left Side Bend No pain    Lumbar - Right Rotation No pain    Lumbar - Left Rotation No pain      PROM   Overall PROM Comments normal passive hip motion      Strength   Right/Left Hip Right;Left   Right Hip Flexion 4+/5   Right Hip Extension 5/5   Right Hip ABduction 5/5   Right Hip ADduction 5/5   Left Hip Flexion 5/5   Left Hip Extension 5/5   Left Hip ABduction 5/5   Left Hip ADduction 5/5     Flexibility   Soft Tissue Assessment /Muscle Length yes   Hamstrings 90/90 limited 10 degrees on right      Palpation   Spinal mobility Right hip elevation and left posterior rotation   Palpation comment spasming bilateral L > R     Special Tests    Special Tests --  SLR (-)             Objective measurements completed on examination: See above findings.          [place] Adult PT Treatment/Exercise - 04/26/17 0001      Lumbar Exercises: Supine   AB Set Limitations with PPT 2x10    Bridge Limitations x10    Other Supine Lumbar Exercises self met wih dowel 5x 10 second hold      Manual Therapy   Manual therapy comments MET for posterior left rotation                PT Education - 04/26/17 1243  Education provided Yes   Education Details reviewed symptom mangement; rationale behind pelvic rotation and pain; HEP    Person(s) Educated Patient   Methods Demonstration;Explanation;Tactile cues;Verbal cues   Comprehension Verbalized  understanding;Returned demonstration;Verbal cues required;Tactile cues required          PT Short Term Goals - 04/26/17 1301      PT SHORT TERM GOAL #1   Title Patient will demsotrate good core contraction    Time 4   Period Weeks   Status New   Target Date 05/24/17     PT SHORT TERM GOAL #2   Title Patient will demsotrate full pain free lumbar ROM    Time 4   Period Weeks   Status New   Target Date 05/24/17     PT SHORT TERM GOAL #3   Title Patient will be independent with basic HEP    Time 4   Period Weeks   Status New   Target Date 05/24/17     PT SHORT TERM GOAL #4   Title Patients left ASIS will be levl with right ASIS    Time 4   Period Weeks   Status New   Target Date 05/24/17           PT Long Term Goals - 04/26/17 1306      PT LONG TERM GOAL #1   Title Patient will ride on her horse for 1 hour without self report of pain    Time 4   Period Weeks   Status New     PT LONG TERM GOAL #2   Title Patient will sleep though the nght without waking up 2nd to pain    Time 4   Period Weeks   Status New     PT LONG TERM GOAL #3   Title Patient will stand for 1 hour without report of increased pain in order to perform ADL's    Time 4   Period Weeks   Status New                Plan - 04/26/17 1251    Clinical Impression Statement Patient is a 61 year old female with low back pain right side > left. she has increased pain when riding her horse and at night. She has pain with forward flexion and extension. She has spasming in her lower back. right > left. She is hyper lordodic. She has a posterior rotation of the left innominate. She would benefit from core stabilization and correction of her pelvis. She was seen for a low complexity evaluation.    History and Personal Factors relevant to plan of care: Swets disease; anxiety    Clinical Presentation Stable   Clinical Presentation due to: pain that has stayed at a high but consistent level     Clinical Decision Making Low   Rehab Potential Good   PT Frequency 2x / week   PT Duration 8 weeks   PT Treatment/Interventions ADLs/Self Care Home Management;Cryotherapy;Electrical Stimulation;Iontophoresis 91m/ml Dexamethasone;Moist Heat;Traction;Ultrasound;Therapeutic activities;Therapeutic exercise;Neuromuscular re-education;Manual techniques;Passive range of motion;Dry needling;Taping   PT Next Visit Plan TPDN to lumbar spine; add briding; SLR; clam shell; shoulder extension with abdominal bracing; manual therapy to lumbar spine; posterior left pelvic rotation correction   PT Home Exercise Plan posterior pelvic tilt    Consulted and Agree with Plan of Care Patient      Patient will benefit from skilled therapeutic intervention in order to improve the following deficits and impairments:  Visit Diagnosis: Chronic bilateral low back pain with right-sided sciatica - Plan: PT plan of care cert/re-cert  Other muscle spasm - Plan: PT plan of care cert/re-cert     Problem List Patient Active Problem List   Diagnosis Date Noted  . PSVT (paroxysmal supraventricular tachycardia) ([place]) 02/22/2017  . Dyspnea 01/25/2017  . Enlarged heart 12/06/2016  . Chronic fatigue 07/24/2013  . Depression 03/12/2013  . Insomnia 03/12/2013  . Rash 02/21/2011  . ALLERGIC RHINITIS 08/18/2010  . CERVICAL CANCER, HX OF 11/02/2009  . CERVICAL CANCER 10/26/2009  . HYPOTHYROIDISM 10/26/2009  . 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 SINUSITIS, RECURRENT 10/26/2009  . Asthma 10/26/2009  . LOW BACK PAIN, CHRONIC 10/26/2009  . HEMORRHOIDS, HX OF 10/26/2009  . PERSONAL HISTORY OF URINARY CALCULI 10/26/2009    Carney Living PT DPT  04/26/2017, 1:28 PM  [facility] [address] [place], [place], [address] Phone: [phone]   Fax:  [phone]  Name: Emily Calhoun MRN: [id number] Date of Birth: September 11, 1955

## 2017-05-02 ENCOUNTER — Encounter: Payer: Self-pay | Admitting: Physical Therapy

## 2017-05-02 ENCOUNTER — Ambulatory Visit: Payer: 59 | Admitting: Physical Therapy

## 2017-05-02 DIAGNOSIS — H2513 Age-related nuclear cataract, bilateral: Secondary | ICD-10-CM | POA: Diagnosis not present

## 2017-05-02 DIAGNOSIS — H524 Presbyopia: Secondary | ICD-10-CM | POA: Diagnosis not present

## 2017-05-02 DIAGNOSIS — M62838 Other muscle spasm: Secondary | ICD-10-CM | POA: Diagnosis not present

## 2017-05-02 DIAGNOSIS — H52223 Regular astigmatism, bilateral: Secondary | ICD-10-CM | POA: Diagnosis not present

## 2017-05-02 DIAGNOSIS — G8929 Other chronic pain: Secondary | ICD-10-CM | POA: Diagnosis not present

## 2017-05-02 DIAGNOSIS — M5441 Lumbago with sciatica, right side: Secondary | ICD-10-CM | POA: Diagnosis not present

## 2017-05-02 DIAGNOSIS — H04123 Dry eye syndrome of bilateral lacrimal glands: Secondary | ICD-10-CM | POA: Diagnosis not present

## 2017-05-02 DIAGNOSIS — H5203 Hypermetropia, bilateral: Secondary | ICD-10-CM | POA: Diagnosis not present

## 2017-05-02 DIAGNOSIS — H40023 Open angle with borderline findings, high risk, bilateral: Secondary | ICD-10-CM | POA: Diagnosis not present

## 2017-05-02 IMAGING — MR MR CERVICAL SPINE WO/W CM
4 of 8 series · 22 of 48 positions shown · IV contrast (multihance)
Comparison: 08/12/2013

CLINICAL DATA: Itching and paresthesias. Burning/ itching involving
the neck, clavicular regions, and upper arms. Intermittent neck pain
radiating into the right arm.

EXAM:
MRI CERVICAL SPINE WITHOUT AND WITH CONTRAST
TECHNIQUE: Multiplanar and multiecho pulse sequences of the cervical spine, to
include the craniocervical junction and cervicothoracic junction,
were obtained without and with intravenous contrast.
CONTRAST:  14mL MULTIHANCE GADOBENATE DIMEGLUMINE 529 MG/ML IV SOLN

[Series 3: T1 · sagittal · 3.0mm · 0.41mm/px · 3 of 12 slices shown]
[im 1/12]
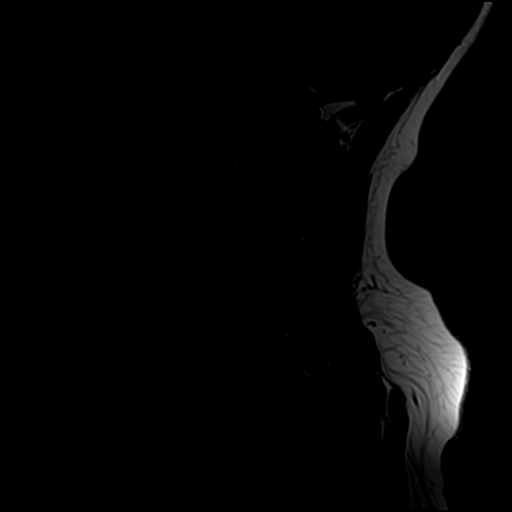
[im 8/12]
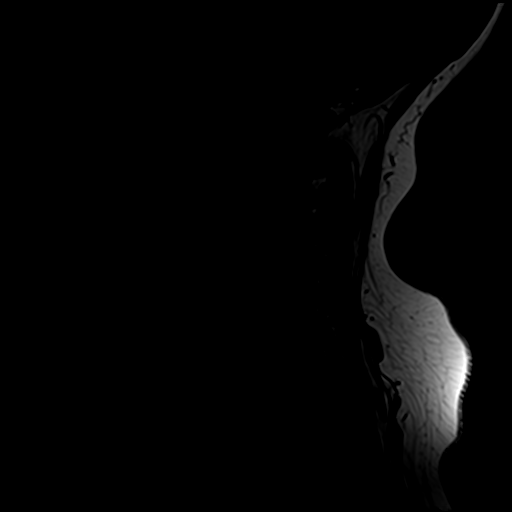
[im 12/12]
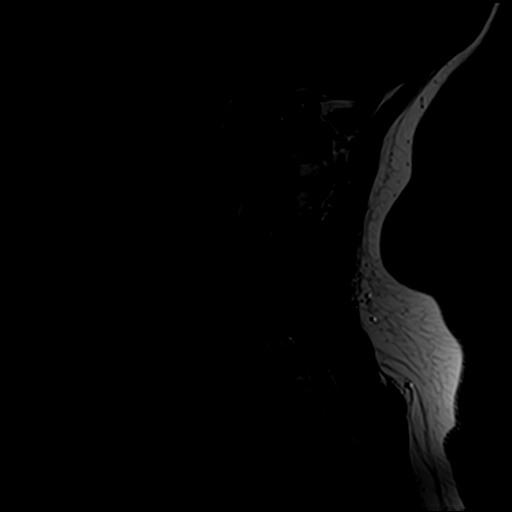

[Series 4: T2 · axial · 3.0mm · 0.39mm/px · z∈[-30,+62]mm · 8 of 26 slices shown (1 of 3)]
[im 1/26]
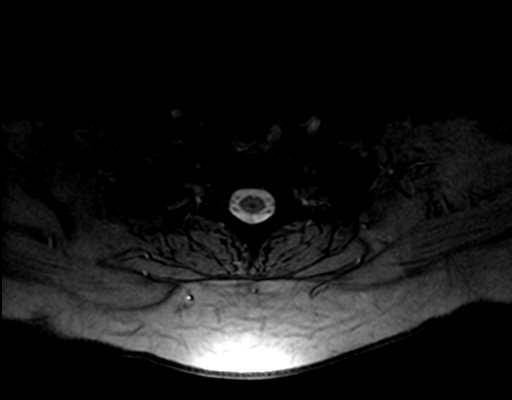
[im 4/26]
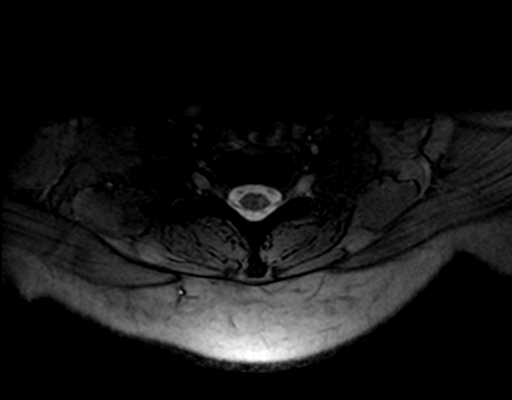
[im 8/26]
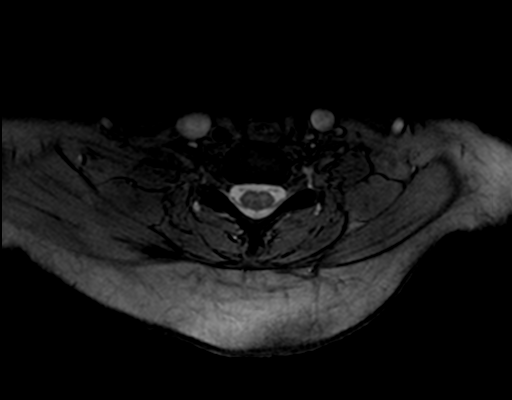
[im 11/26]
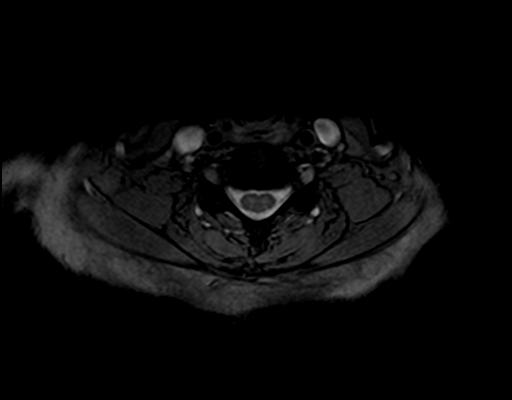
[im 15/26]
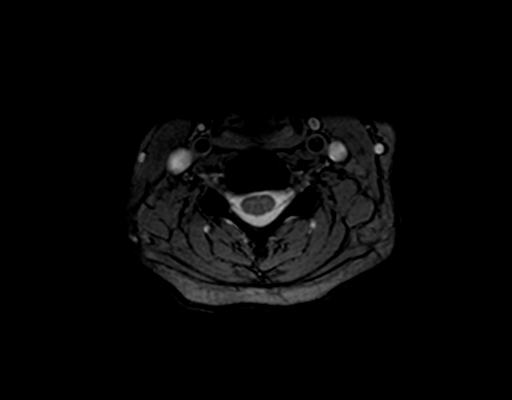
[im 18/26]
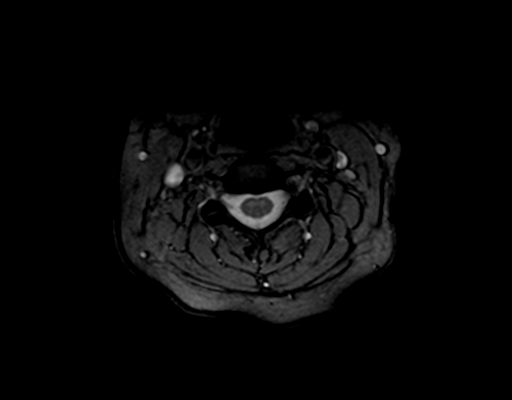
[im 22/26]
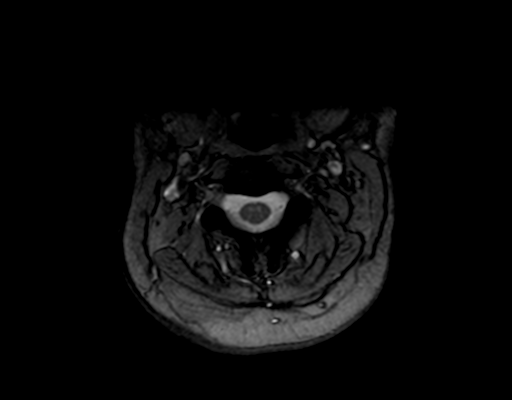
[im 26/26]
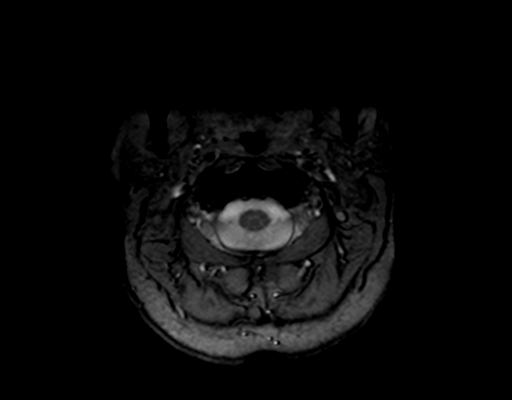

[Series 5: T2 · axial · 3.0mm · 0.39mm/px · z∈[-31,+62]mm · 8 of 26 slices shown (2 of 3)]
[im 1/26]
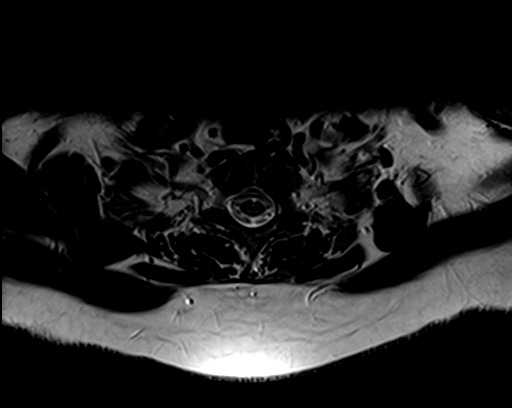
[im 4/26]
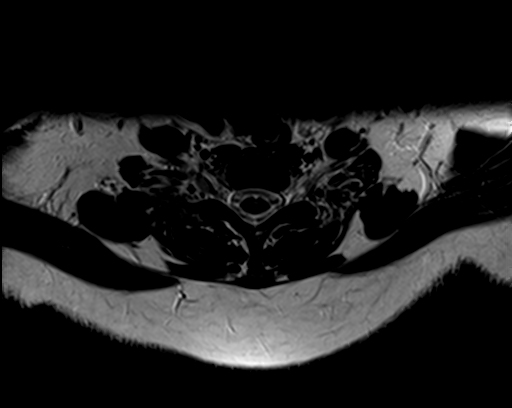
[im 8/26]
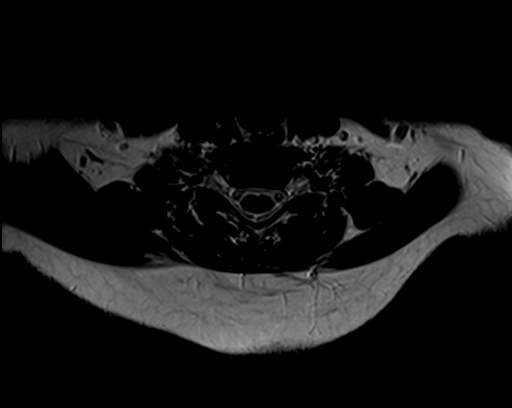
[im 11/26]
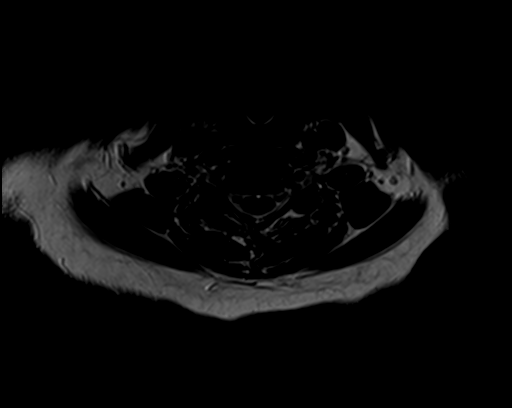
[im 15/26]
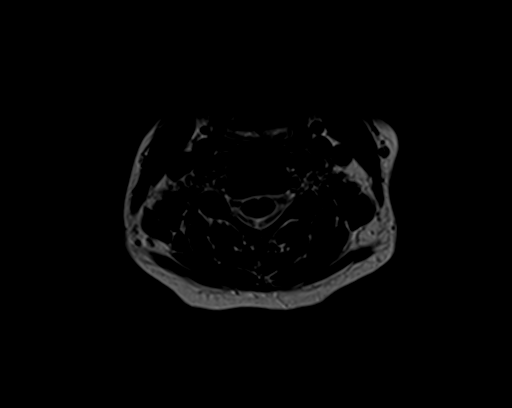
[im 18/26]
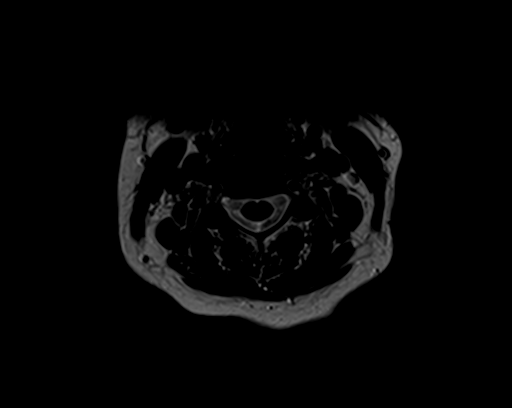
[im 22/26]
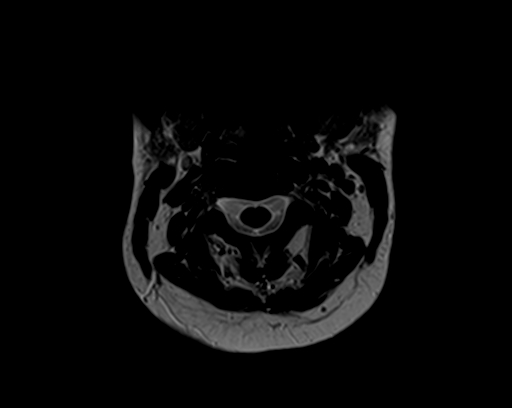
[im 26/26]
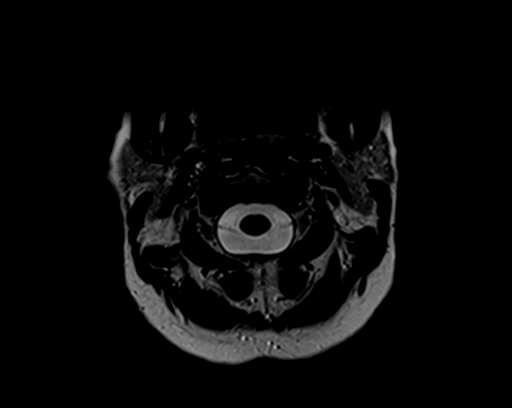

[Series 7: T2 · sagittal · 3.0mm · 0.41mm/px · 3 of 12 slices shown (3 of 3)]
[im 1/12]
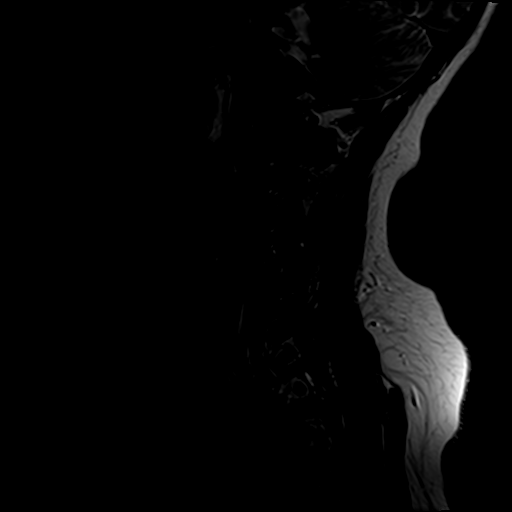
[im 8/12]
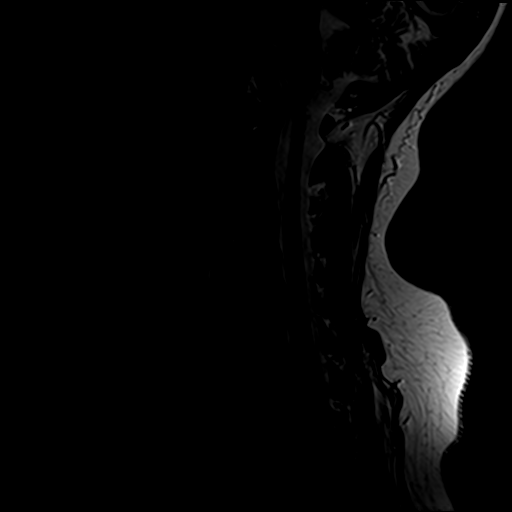
[im 12/12]
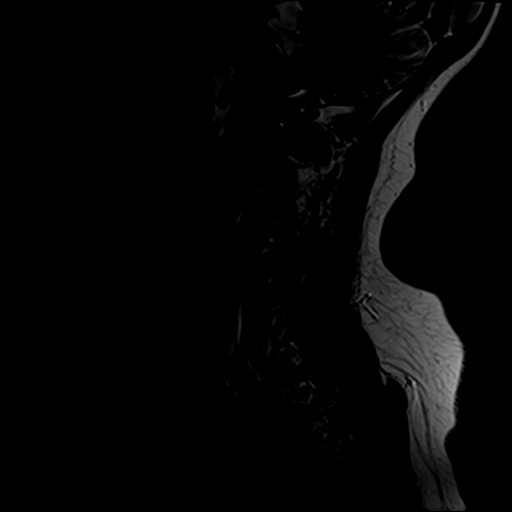

[22 of 48 positions shown; findings below may reference images not displayed]

FINDINGS: Alignment: Unchanged cervical spine straightening. No significant
listhesis.

Vertebrae: Preserved vertebral body height without evidence of
fracture or osseous lesion. Minimal type 1 degenerative endplate
changes at C5-6.

Cord: Normal signal and morphology. No abnormal intradural
enhancement.

Posterior Fossa, vertebral arteries, paraspinal tissues:
Unremarkable.

Disc levels:

C2-3:  Negative.

C3-4: Minimal uncovertebral spurring without disc herniation or
stenosis, unchanged.

C4-5: Mild-to-moderate disc space narrowing. Small right paracentral
disc protrusion and right uncovertebral spurring have slightly
progressed and results in minimal right neural foraminal stenosis.
No spinal stenosis.

C5-6: Mild-to-moderate disc space narrowing. Broad-based posterior
disc osteophyte complex results in minimal right neural foraminal
narrowing and flattening of the ventral thecal sac without
significant spinal stenosis or spinal cord mass effect, unchanged.

C6-7:  Negative.

C7-T1:  Negative.
IMPRESSION: 1. Slightly progressive disc degeneration at C4-5 with minimal right
neural foraminal narrowing.
2. Unchanged C5-6 disc degeneration with minimal right neural
foraminal narrowing.
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
## 2017-05-07 MED FILL — METOPROLOL TARTRATE 25 MG T: 25 | 30 days supply | Qty: 60 | Fill #1

## 2017-05-07 MED FILL — ESTRADIOL 0.05 MG PATCH: 0.05 | 84 days supply | Qty: 24 | Fill #2

## 2017-05-07 MED FILL — ACYCLOVIR 400 MG TABLET: 400 | 90 days supply | Qty: 90 | Fill #3

## 2017-05-07 MED FILL — ESCITALOPRAM 20 MG TABLET: 20 | 90 days supply | Qty: 90 | Fill #3

## 2017-05-09 ENCOUNTER — Encounter: Payer: 59 | Admitting: Physical Therapy

## 2017-05-11 ENCOUNTER — Encounter: Payer: Self-pay | Admitting: Physical Therapy

## 2017-05-11 ENCOUNTER — Ambulatory Visit: Payer: 59 | Admitting: Physical Therapy

## 2017-05-11 DIAGNOSIS — M62838 Other muscle spasm: Secondary | ICD-10-CM | POA: Diagnosis not present

## 2017-05-11 DIAGNOSIS — G8929 Other chronic pain: Secondary | ICD-10-CM

## 2017-05-11 DIAGNOSIS — M5441 Lumbago with sciatica, right side: Secondary | ICD-10-CM | POA: Diagnosis not present

## 2017-05-11 NOTE — Therapy (Signed)
[name], [place], [address] Phone: [phone]   Fax:  [phone]  Physical Therapy Treatment  Patient Details  Name: Emily Calhoun MRN: [id number] Date of Birth: 05-Aug-1955 Referring Provider: Dr Elsie Saas   Encounter Date: 05/11/2017      PT End of Session - 05/11/17 0937    Visit Number 3   Number of Visits 16   Date for PT Re-Evaluation 06/21/17   Authorization Type [facility] UMR    PT Start Time 0848   PT Stop Time 0936   PT Time Calculation (min) 48 min   Activity Tolerance Patient tolerated treatment well   Behavior During Therapy [facility] for tasks assessed/performed      Past Medical History:  Diagnosis Date  . Anal fissure   . Anxiety   . Asthma   . Asthma   . Autoimmune disease ([place])   . Cervical cancer [facility])    mom took DES  . Hepatitis A   . History of kidney stones   . Hyperlipidemia   . 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 IBS (irritable bowel syndrome)   . Migraine   . Migraine   . STD (sexually transmitted disease)    HSV II  . Sweet's disease   . Thyroid disease    hypothyroid    Past Surgical History:  Procedure Laterality Date  . ABDOMINAL HYSTERECTOMY  1988   ?retained ovaries for cx ca  . blephoplasty    . BREAST LUMPECTOMY Left 2009   benign; ?cyst  . BREAST REDUCTION SURGERY Bilateral 09/2010  . ELBOW SURGERY Right 1995   tendon repair  . ELBOW SURGERY Right   . EXCISION VAGINAL CYST  10-25-2009   benign squamous cyst  . FOOT SURGERY Right   . internal hemorrhoidectomy    . NASAL SINUS SURGERY     x 2    There were no vitals filed for this visit.      Subjective Assessment - 05/11/17 0852    Subjective Pt had a really bad day yesterday, 8/10 could barely get out of bed.  Was sitting on airplane the day before. Donnald Garre been out of my routine.    Currently in Pain? Yes   Pain Score 4    Pain Location Back   Pain Orientation Right   Pain  Descriptors / Indicators Aching   Pain Type Chronic pain   Pain Onset More than a month ago   Pain Frequency Constant   Aggravating Factors  sitting   Pain Relieving Factors reported today that riding her horse actually makes it better               [facility] Adult PT Treatment/Exercise - 05/11/17 0001      Lumbar Exercises: Stretches   Lower Trunk Rotation Limitations x10   Pelvic Tilt 10 seconds   Pelvic Tilt Limitations x 10    Piriformis Stretch 3 reps;30 seconds     Lumbar Exercises: Supine   Clam --   Clam Limitations --   Heel Slides --   Bridge 15 reps   Bridge Limitations slow, controlled and articulating for spine mobility and core control    Straight Leg Raise --   Other Supine Lumbar Exercises --     Lumbar Exercises: Sidelying   Other Sidelying Lumbar Exercises quadratus lumborum in multiple positions, spent increased time      Manual Therapy   Manual Therapy Myofascial release  Soft tissue mobilization Rt. quadratus lumborum, paraspinals, Rt gluteals, piriformis    Myofascial Release lumbo sacral in prone, compression with hip ER IR    Scapular Mobilization LAD bilateral 3x30 sec hold each                 PT Education - 05/11/17 0937    Education provided Yes   Education Details Quadratus lumborum, HEp , warm up ex prior to riding    Person(s) Educated Patient   Methods Explanation   Comprehension Verbalized understanding;Returned demonstration          PT Short Term Goals - 05/11/17 0937      PT SHORT TERM GOAL #1   Title Patient will demsotrate good core contraction    Status On-going     PT SHORT TERM GOAL #2   Title Patient will demsotrate full pain free lumbar ROM    Status On-going     PT SHORT TERM GOAL #3   Title Patient will be independent with basic HEP    Status On-going           PT Long Term Goals - 04/26/17 1306      PT LONG TERM GOAL #1   Title Patient will ride on her horse for 1 hour without self report of  pain    Time 4   Period Weeks   Status New     PT LONG TERM GOAL #2   Title Patient will sleep though the nght without waking up 2nd to pain    Time 4   Period Weeks   Status New     PT LONG TERM GOAL #3   Title Patient will stand for 1 hour without report of increased pain in order to perform ADL's    Time 4   Period Weeks   Status New               Plan - 05/11/17 0240    Clinical Impression Statement L ASIS apeared slightly lower than Rt.  Did not treat with MET today.  Worked on elongation of Rt. trunk.  Instructed her to perform spinal mobility and targeted stretching vs non purposeful exercises.  Less stiffness post.    PT Treatment/Interventions ADLs/Self Care Home Management;Cryotherapy;Electrical Stimulation;Iontophoresis 17m/ml Dexamethasone;Moist Heat;Traction;Ultrasound;Therapeutic activities;Therapeutic exercise;Neuromuscular re-education;Manual techniques;Passive range of motion;Dry needling;Taping   PT Next Visit Plan TPDN to lumbar spine; add core stab.  SLR; clam shell; shoulder extension with abdominal bracing; manual therapy to lumbar spine; check pelvic rotation correction   PT Home Exercise Plan posterior pelvic tilt , bridge (articulating), Rt. QL and trunk stretching    Consulted and Agree with Plan of Care Patient      Patient will benefit from skilled therapeutic intervention in order to improve the following deficits and impairments:     Visit Diagnosis: Chronic bilateral low back pain with right-sided sciatica  Other muscle spasm     Problem List Patient Active Problem List   Diagnosis Date Noted  . PSVT (paroxysmal supraventricular tachycardia) ([place] 02/22/2017  . Dyspnea 01/25/2017  . Enlarged heart 12/06/2016  . Chronic fatigue 07/24/2013  . Depression 03/12/2013  . Insomnia 03/12/2013  . Rash 02/21/2011  . ALLERGIC RHINITIS 08/18/2010  . CERVICAL CANCER, HX OF 11/02/2009  . CERVICAL CANCER 10/26/2009  . HYPOTHYROIDISM 10/26/2009   . 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 SINUSITIS, RECURRENT 10/26/2009  . Asthma 10/26/2009  . LOW BACK PAIN, CHRONIC 10/26/2009  . HEMORRHOIDS, HX OF 10/26/2009  . PERSONAL HISTORY  OF URINARY CALCULI 10/26/2009    Levonne Carreras 05/11/2017, 11:11 AM  [facility] [address] [place], [place], [address] Phone: [phone]   Fax:  [phone]  Name: TZIVIA ONEIL MRN: [id number] Date of Birth: March 17, 1956  Raeford Razor, PT 05/11/17 11:11 AM Phone: [phone] Fax: [phone]

## 2017-05-11 NOTE — Patient Instructions (Signed)
Cabinet sidelying QL stretch, childs pose

## 2017-05-16 ENCOUNTER — Encounter: Payer: Self-pay | Admitting: Physical Therapy

## 2017-05-16 ENCOUNTER — Ambulatory Visit: Payer: 59 | Admitting: Physical Therapy

## 2017-05-16 DIAGNOSIS — G8929 Other chronic pain: Secondary | ICD-10-CM | POA: Diagnosis not present

## 2017-05-16 DIAGNOSIS — M62838 Other muscle spasm: Secondary | ICD-10-CM | POA: Diagnosis not present

## 2017-05-16 DIAGNOSIS — M5441 Lumbago with sciatica, right side: Secondary | ICD-10-CM | POA: Diagnosis not present

## 2017-05-16 NOTE — Therapy (Signed)
[place], [place], [address] Phone: [phone]   Fax:  [phone]  Physical Therapy Treatment  Patient Details  Name: Emily Calhoun MRN: [id number] Date of Birth: 1956/01/21 Referring Provider: Dr Elsie Saas   Encounter Date: 05/16/2017      PT End of Session - 05/16/17 1308    Visit Number 4   Number of Visits 16   Date for PT Re-Evaluation 06/21/17   Authorization Type [facility] UMR    PT Start Time 1017   PT Stop Time 1110   PT Time Calculation (min) 53 min   Activity Tolerance Patient tolerated treatment well   Behavior During Therapy [facility] for tasks assessed/performed      Past Medical History:  Diagnosis Date  . Anal fissure   . Anxiety   . Asthma   . Asthma   . Autoimmune disease ([place])   . Cervical cancer [facility])    mom took DES  . Hepatitis A   . History of kidney stones   . Hyperlipidemia   . 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 IBS (irritable bowel syndrome)   . Migraine   . Migraine   . STD (sexually transmitted disease)    HSV II  . Sweet's disease   . Thyroid disease    hypothyroid    Past Surgical History:  Procedure Laterality Date  . ABDOMINAL HYSTERECTOMY  1988   ?retained ovaries for cx ca  . blephoplasty    . BREAST LUMPECTOMY Left 2009   benign; ?cyst  . BREAST REDUCTION SURGERY Bilateral 09/2010  . ELBOW SURGERY Right 1995   tendon repair  . ELBOW SURGERY Right   . EXCISION VAGINAL CYST  10-25-2009   benign squamous cyst  . FOOT SURGERY Right   . internal hemorrhoidectomy    . NASAL SINUS SURGERY     x 2    There were no vitals filed for this visit.      Subjective Assessment - 05/16/17 1306    Subjective Patient reports that her back was bad when she was travelin. Since she has been home her back has improved. she has been working on her exrecises. She has spasming in her quadratus she reports.     Limitations Standing;Walking   How long can  you sit comfortably? increased pain the longer she sits but can be variable    How long can you stand comfortably? same as sitting    Diagnostic tests X-ray lumbar 2014: L3-L4 L4-L5 L5 S1 mild degeneration    Patient Stated Goals to have less pain    Currently in Pain? Yes   Pain Score 3    Pain Location Back   Pain Orientation Right   Pain Descriptors / Indicators Aching   Pain Type Chronic pain   Pain Onset More than a month ago   Pain Frequency Constant   Aggravating Factors  sitting    Pain Relieving Factors riding her horse?    Effect of Pain on Daily Activities pain at night                          [facility] Adult PT Treatment/Exercise - 05/16/17 0001      Lumbar Exercises: Supine   Other Supine Lumbar Exercises reviewed self MET      Modalities   Modalities Moist Heat     Moist Heat Therapy   Number Minutes Moist Heat 10  Minutes   Moist Heat Location Lumbar Spine     Manual Therapy   Manual Therapy Myofascial release   Manual therapy comments MET for posterior left rotation   Soft tissue mobilization iASTYM to gluteals and lumbar paraspinalsright quadratus and into gluts.    Myofascial Release lumbo sacral in prone, compression with hip ER IR    Scapular Mobilization LAD bilateral 3x30 sec hold each                 PT Education - 05/16/17 1308    Education provided Yes   Education Details reviewed MET for hip and reasoning    Person(s) Educated Patient   Methods Explanation;Demonstration;Tactile cues;Verbal cues;Handout   Comprehension Verbalized understanding;Returned demonstration;Verbal cues required;Tactile cues required          PT Short Term Goals - 05/11/17 0937      PT SHORT TERM GOAL #1   Title Patient will demsotrate good core contraction    Status On-going     PT SHORT TERM GOAL #2   Title Patient will demsotrate full pain free lumbar ROM    Status On-going     PT SHORT TERM GOAL #3   Title Patient will be independent  with basic HEP    Status On-going           PT Long Term Goals - 05/16/17 1312      PT LONG TERM GOAL #1   Title Patient will ride on her horse for 1 hour without self report of pain    Baseline no change    Time 4   Period Weeks   Status On-going     PT LONG TERM GOAL #2   Title Patient will sleep though the nght without waking up 2nd to pain    Baseline 20%   Time 4   Period Weeks   Status On-going     PT LONG TERM GOAL #3   Title Patient will stand for 1 hour without report of increased pain in order to perform ADL's    Time 4   Period Weeks   Status On-going     PT LONG TERM GOAL #4   Title She will report able to ride horse for 45-60 min without incr back pain   Time 4   Period Weeks   Status On-going               Plan - 05/16/17 1309    Clinical Impression Statement Right ASIS wappeared to be back to lower then the left. She was encouraged to work on her MET at home for anteiror rotation of the right ASIS. She reported no increase in pain. Therapy perfromed dry needling of ther glut medius and of her quaratus. She had no significant pain. She was encouraged to continue with her soft tissue mobilization at home.    Clinical Presentation Stable   Clinical Decision Making Low   Rehab Potential Good   PT Frequency 2x / week   PT Duration 8 weeks   PT Treatment/Interventions ADLs/Self Care Home Management;Cryotherapy;Electrical Stimulation;Iontophoresis 64m/ml Dexamethasone;Moist Heat;Traction;Ultrasound;Therapeutic activities;Therapeutic exercise;Neuromuscular re-education;Manual techniques;Passive range of motion;Dry needling;Taping   PT Next Visit Plan TPDN to lumbar spine; add core stab.  SLR; clam shell; shoulder extension with abdominal bracing; manual therapy to lumbar spine; check pelvic rotation correction   PT Home Exercise Plan posterior pelvic tilt , bridge (articulating), Rt. QL and trunk stretching    Consulted and Agree with Plan of Care Patient  Patient will benefit from skilled therapeutic intervention in order to improve the following deficits and impairments:     Visit Diagnosis: Chronic bilateral low back pain with right-sided sciatica  Other muscle spasm     Problem List Patient Active Problem List   Diagnosis Date Noted  . PSVT (paroxysmal supraventricular tachycardia) ([place]) 02/22/2017  . Dyspnea 01/25/2017  . Enlarged heart 12/06/2016  . Chronic fatigue 07/24/2013  . Depression 03/12/2013  . Insomnia 03/12/2013  . Rash 02/21/2011  . ALLERGIC RHINITIS 08/18/2010  . CERVICAL CANCER, HX OF 11/02/2009  . CERVICAL CANCER 10/26/2009  . HYPOTHYROIDISM 10/26/2009  . 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 SINUSITIS, RECURRENT 10/26/2009  . Asthma 10/26/2009  . LOW BACK PAIN, CHRONIC 10/26/2009  . HEMORRHOIDS, HX OF 10/26/2009  . PERSONAL HISTORY OF URINARY CALCULI 10/26/2009    Carney Living PT DPT  05/16/2017, 1:14 PM  [facility] [address] [place], [place], [address] Phone: [phone]   Fax:  [phone]  Name: Emily Calhoun MRN: [id number] Date of Birth: Mar 29, 1956

## 2017-05-23 ENCOUNTER — Encounter: Payer: Self-pay | Admitting: Physical Therapy

## 2017-05-23 ENCOUNTER — Ambulatory Visit: Payer: 59 | Attending: Orthopedic Surgery | Admitting: Physical Therapy

## 2017-05-23 ENCOUNTER — Encounter: Payer: 59 | Admitting: Physical Therapy

## 2017-05-23 DIAGNOSIS — G8929 Other chronic pain: Secondary | ICD-10-CM | POA: Diagnosis not present

## 2017-05-23 DIAGNOSIS — M5441 Lumbago with sciatica, right side: Secondary | ICD-10-CM | POA: Insufficient documentation

## 2017-05-23 DIAGNOSIS — M62838 Other muscle spasm: Secondary | ICD-10-CM | POA: Diagnosis not present

## 2017-05-24 ENCOUNTER — Encounter: Payer: Self-pay | Admitting: Physical Therapy

## 2017-05-24 NOTE — Therapy (Addendum)
[place], [place], [address] Phone: [phone]   Fax:  [phone]  Physical Therapy Treatment/ Discharge   Patient Details  Name: Emily Calhoun MRN: [id number] Date of Birth: Sep 26, 1955 Referring Provider: Dr Elsie Saas    Encounter Date: 05/23/2017  PT End of Session - 05/24/17 1523    Visit Number  5    Number of Visits  16    Date for PT Re-Evaluation  06/21/17    Authorization Type  [facility] UMR     PT Start Time  1545    PT Stop Time  1629    PT Time Calculation (min)  44 min    Activity Tolerance  Patient tolerated treatment well    Behavior During Therapy  [facility] for tasks assessed/performed       Past Medical History:  Diagnosis Date  . Anal fissure   . Anxiety   . Asthma   . Asthma   . Autoimmune disease ([place])   . Cervical cancer [facility])    mom took DES  . Hepatitis A   . History of kidney stones   . Hyperlipidemia   . 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 IBS (irritable bowel syndrome)   . Migraine   . Migraine   . STD (sexually transmitted disease)    HSV II  . Sweet's disease   . Thyroid disease    hypothyroid    Past Surgical History:  Procedure Laterality Date  . ABDOMINAL HYSTERECTOMY  1988   ?retained ovaries for cx ca  . blephoplasty    . BREAST LUMPECTOMY Left 2009   benign; ?cyst  . BREAST REDUCTION SURGERY Bilateral 09/2010  . ELBOW SURGERY Right 1995   tendon repair  . ELBOW SURGERY Right   . EXCISION VAGINAL CYST  10-25-2009   benign squamous cyst  . FOOT SURGERY Right   . internal hemorrhoidectomy    . NASAL SINUS SURGERY     x 2    There were no vitals filed for this visit.  Subjective Assessment - 05/23/17 1555    Subjective  Patient reports no real difference in her back. She has been driving long distances which has increased her back pain. She has been working on her exercises but does not feel like they are helping.     Limitations   Standing;Walking    How long can you sit comfortably?  increased pain the longer she sits but can be variable     How long can you stand comfortably?  same as sitting     Diagnostic tests  X-ray lumbar 2014: L3-L4 L4-L5 L5 S1 mild degeneration     Patient Stated Goals  to have less pain                       [facility] Adult PT Treatment/Exercise - 05/24/17 0001      Lumbar Exercises: Stretches   Passive Hamstring Stretch  3 reps;20 seconds    Lower Trunk Rotation Limitations  2x10    Piriformis Stretch  3 reps;30 seconds      Lumbar Exercises: Supine   Clam Limitations  2x10 red     Bridge Limitations  2x10     Other Supine Lumbar Exercises  reviewed self MET       Modalities   Modalities  Moist Heat      Manual Therapy   Manual Therapy  Myofascial release;Joint mobilization  Manual therapy comments  MET for posterior left rotation    Joint Mobilization  Grade 5 distraction mobilization; Good cavitation x2 with immediate improvement of symptoms.     Soft tissue mobilization  iASTYM to gluteals and lumbar paraspinalsright quadratus and into gluts.     Myofascial Release  lumbo sacral in prone, compression with hip ER IR     Scapular Mobilization  LAD bilateral 3x30 sec hold each        Trigger Point Dry Needling - 05/24/17 1524    Consent Given?  Yes    Education Handout Provided  Yes    Longissimus Response  Twitch response elicited    Gluteus Minimus Response  Twitch response elicited           PT Education - 05/24/17 1522    Education provided  Yes    Education Details  reviewed HEP; encouraged to continue at home    Person(s) Educated  Patient    Methods  Explanation;Demonstration;Tactile cues;Handout    Comprehension  Verbalized understanding;Returned demonstration;Verbal cues required;Tactile cues required       PT Short Term Goals - 05/11/17 0937      PT SHORT TERM GOAL #1   Title  Patient will demsotrate good core contraction     Status   On-going      PT SHORT TERM GOAL #2   Title  Patient will demsotrate full pain free lumbar ROM     Status  On-going      PT SHORT TERM GOAL #3   Title  Patient will be independent with basic HEP     Status  On-going        PT Long Term Goals - 05/16/17 1312      PT LONG TERM GOAL #1   Title  Patient will ride on her horse for 1 hour without self report of pain     Baseline  no change     Time  4    Period  Weeks    Status  On-going      PT LONG TERM GOAL #2   Title  Patient will sleep though the nght without waking up 2nd to pain     Baseline  20%    Time  4    Period  Weeks    Status  On-going      PT LONG TERM GOAL #3   Title  Patient will stand for 1 hour without report of increased pain in order to perform ADL's     Time  4    Period  Weeks    Status  On-going      PT LONG TERM GOAL #4   Title  She will report able to ride horse for 45-60 min without incr back pain    Time  4    Period  Weeks    Status  On-going            Plan - 05/24/17 1526    Clinical Impression Statement  Patient continues to heve a left poterior rotation despite mobiliztions and home mobilizations. Therapy perfromed a grade 5 manipulation of her hip with a good cavitation. She had a good twitch respose with dry needling. Overall she does not feel like she is progressing. she was advised to come back for 1 visit to see how manipulation worked. She may go to a Chiropracter.     History and Personal Factors relevant to plan of care:  anxiety Sweets disease  Clinical Presentation  Stable    Clinical Decision Making  Low    Rehab Potential  Good    PT Frequency  2x / week    PT Duration  8 weeks    PT Treatment/Interventions  ADLs/Self Care Home Management;Cryotherapy;Electrical Stimulation;Iontophoresis 65m/ml Dexamethasone;Moist Heat;Traction;Ultrasound;Therapeutic activities;Therapeutic exercise;Neuromuscular re-education;Manual techniques;Passive range of motion;Dry needling;Taping     PT Next Visit Plan  asses tolerance to manip; continue with MET for hips; Continue with soft tissue mobilization; review higher level core stabilization. D/C tif contoinues to have no change in symptoms.     PT Home Exercise Plan  posterior pelvic tilt , bridge (articulating), Rt. QL and trunk stretching     Consulted and Agree with Plan of Care  Patient       Patient will benefit from skilled therapeutic intervention in order to improve the following deficits and impairments:  Abnormal gait, Pain, Postural dysfunction, Increased muscle spasms, Decreased range of motion  Visit Diagnosis: Chronic bilateral low back pain with right-sided sciatica  Other muscle spasm  PHYSICAL THERAPY DISCHARGE SUMMARY  Visits from Start of Care: 5  Current functional level related to goals / functional outcomes: Continued pain with driving    Remaining deficits: Continued pain with functional tasks    Education / Equipment: HEP  Plan: Patient agrees to discharge.  Patient goals were not met. Patient is being discharged due to lack of progress.  ?????       Problem List Patient Active Problem List   Diagnosis Date Noted  . PSVT (parox[place]mal [place]) [place] 02/22/2017  . Dyspnea 01/25/2017  . Enlarged heart 12/06/2016  . Chronic fatigue 07/24/2013  . Depression 03/12/2013  . Insomnia 03/12/2013  . Rash 02/21/2011  . ALLERGIC RHINITIS 08/18/2010  . CERVICAL CANCER, HX OF 11/02/2009  . CERVICAL CANCER 10/26/2009  . HYPOTHYROIDISM 10/26/2009  . 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 SINUSITIS, RECURRENT 10/26/2009  . Asthma 10/26/2009  . LOW BACK PAIN, CHRONIC 10/26/2009  . HEMORRHOIDS, HX OF 10/26/2009  . PERSONAL HISTORY OF URINARY CALCULI 10/26/2009    DCarney LivingPT DPT  05/24/2017, 3:31 PM  [facility] [address]Phone: [phone]  Fax:  [phone] Name: Emily TELLOMRN: [id number] of Birth: 123-Aug-1957

## 2017-05-28 DIAGNOSIS — M5136 Other intervertebral disc degeneration, lumbar region: Secondary | ICD-10-CM | POA: Diagnosis not present

## 2017-05-28 DIAGNOSIS — M62830 Muscle spasm of back: Secondary | ICD-10-CM | POA: Diagnosis not present

## 2017-05-28 DIAGNOSIS — M545 Low back pain: Secondary | ICD-10-CM | POA: Diagnosis not present

## 2017-05-28 DIAGNOSIS — M9903 Segmental and somatic dysfunction of lumbar region: Secondary | ICD-10-CM | POA: Diagnosis not present

## 2017-05-29 DIAGNOSIS — M62830 Muscle spasm of back: Secondary | ICD-10-CM | POA: Diagnosis not present

## 2017-05-29 DIAGNOSIS — M9903 Segmental and somatic dysfunction of lumbar region: Secondary | ICD-10-CM | POA: Diagnosis not present

## 2017-05-29 DIAGNOSIS — F330 Major depressive disorder, recurrent, mild: Secondary | ICD-10-CM | POA: Diagnosis not present

## 2017-05-29 DIAGNOSIS — M545 Low back pain: Secondary | ICD-10-CM | POA: Diagnosis not present

## 2017-05-29 DIAGNOSIS — M5136 Other intervertebral disc degeneration, lumbar region: Secondary | ICD-10-CM | POA: Diagnosis not present

## 2017-05-30 ENCOUNTER — Encounter: Payer: 59 | Admitting: Physical Therapy

## 2017-05-30 ENCOUNTER — Encounter: Payer: Self-pay | Admitting: Internal Medicine

## 2017-05-30 ENCOUNTER — Telehealth: Payer: Self-pay | Admitting: Internal Medicine

## 2017-05-30 MED ORDER — LINACLOTIDE 72 MCG PO CAPS: 72.0000 ug | ORAL_CAPSULE | Freq: Every day | ORAL | 0 refills | Status: DC

## 2017-05-30 MED FILL — GABAPENTIN 300 MG CAPSULE: 300 | 30 days supply | Qty: 90 | Fill #2

## 2017-05-30 MED FILL — LINZESS 72 MCG CAPSULE: 72 | 30 days supply | Qty: 30 | Fill #0

## 2017-05-30 NOTE — Telephone Encounter (Signed)
Rx sent 

## 2017-05-31 DIAGNOSIS — M545 Low back pain: Secondary | ICD-10-CM | POA: Diagnosis not present

## 2017-05-31 DIAGNOSIS — M9903 Segmental and somatic dysfunction of lumbar region: Secondary | ICD-10-CM | POA: Diagnosis not present

## 2017-05-31 DIAGNOSIS — M62830 Muscle spasm of back: Secondary | ICD-10-CM | POA: Diagnosis not present

## 2017-05-31 DIAGNOSIS — M5136 Other intervertebral disc degeneration, lumbar region: Secondary | ICD-10-CM | POA: Diagnosis not present

## 2017-06-01 DIAGNOSIS — F329 Major depressive disorder, single episode, unspecified: Secondary | ICD-10-CM | POA: Diagnosis not present

## 2017-06-01 DIAGNOSIS — Z23 Encounter for immunization: Secondary | ICD-10-CM | POA: Diagnosis not present

## 2017-06-01 DIAGNOSIS — I471 Supraventricular tachycardia: Secondary | ICD-10-CM | POA: Diagnosis not present

## 2017-06-01 DIAGNOSIS — E039 Hypothyroidism, unspecified: Secondary | ICD-10-CM | POA: Diagnosis not present

## 2017-06-01 DIAGNOSIS — Z79899 Other long term (current) drug therapy: Secondary | ICD-10-CM | POA: Diagnosis not present

## 2017-06-01 DIAGNOSIS — K219 Gastro-esophageal reflux disease without esophagitis: Secondary | ICD-10-CM | POA: Diagnosis not present

## 2017-06-01 MED FILL — ESOMEPRAZOLE MAG DR 40 MG C: 40 | 90 days supply | Qty: 90 | Fill #0

## 2017-06-04 ENCOUNTER — Ambulatory Visit: Payer: 59 | Admitting: Interventional Cardiology

## 2017-06-04 DIAGNOSIS — M545 Low back pain: Secondary | ICD-10-CM | POA: Diagnosis not present

## 2017-06-04 DIAGNOSIS — M9903 Segmental and somatic dysfunction of lumbar region: Secondary | ICD-10-CM | POA: Diagnosis not present

## 2017-06-04 DIAGNOSIS — M62830 Muscle spasm of back: Secondary | ICD-10-CM | POA: Diagnosis not present

## 2017-06-04 DIAGNOSIS — M5136 Other intervertebral disc degeneration, lumbar region: Secondary | ICD-10-CM | POA: Diagnosis not present

## 2017-06-04 MED FILL — clonazePAM 1 MG TABS: 1 | 30 days supply | Qty: 30 | Fill #1

## 2017-06-05 DIAGNOSIS — Z0000 Encounter for general adult medical examination without abnormal findings: Secondary | ICD-10-CM | POA: Diagnosis not present

## 2017-06-05 DIAGNOSIS — I4891 Unspecified atrial fibrillation: Secondary | ICD-10-CM | POA: Diagnosis not present

## 2017-06-05 DIAGNOSIS — E039 Hypothyroidism, unspecified: Secondary | ICD-10-CM | POA: Diagnosis not present

## 2017-06-05 DIAGNOSIS — M545 Low back pain: Secondary | ICD-10-CM | POA: Diagnosis not present

## 2017-06-05 DIAGNOSIS — M5136 Other intervertebral disc degeneration, lumbar region: Secondary | ICD-10-CM | POA: Diagnosis not present

## 2017-06-05 DIAGNOSIS — J45990 Exercise induced bronchospasm: Secondary | ICD-10-CM | POA: Diagnosis not present

## 2017-06-05 DIAGNOSIS — M62830 Muscle spasm of back: Secondary | ICD-10-CM | POA: Diagnosis not present

## 2017-06-05 DIAGNOSIS — Z9889 Other specified postprocedural states: Secondary | ICD-10-CM | POA: Diagnosis not present

## 2017-06-05 DIAGNOSIS — E785 Hyperlipidemia, unspecified: Secondary | ICD-10-CM | POA: Diagnosis not present

## 2017-06-05 DIAGNOSIS — N3946 Mixed incontinence: Secondary | ICD-10-CM | POA: Diagnosis not present

## 2017-06-05 DIAGNOSIS — I10 Essential (primary) hypertension: Secondary | ICD-10-CM | POA: Diagnosis not present

## 2017-06-05 DIAGNOSIS — Z86010 Personal history of colonic polyps: Secondary | ICD-10-CM | POA: Diagnosis not present

## 2017-06-05 DIAGNOSIS — M9903 Segmental and somatic dysfunction of lumbar region: Secondary | ICD-10-CM | POA: Diagnosis not present

## 2017-06-06 ENCOUNTER — Encounter: Payer: 59 | Admitting: Physical Therapy

## 2017-06-08 MED FILL — LEVOTHYROXINE 75 MCG TABLET: 75 | 90 days supply | Qty: 90 | Fill #0

## 2017-06-21 DIAGNOSIS — M62830 Muscle spasm of back: Secondary | ICD-10-CM | POA: Diagnosis not present

## 2017-06-21 DIAGNOSIS — M9903 Segmental and somatic dysfunction of lumbar region: Secondary | ICD-10-CM | POA: Diagnosis not present

## 2017-06-21 DIAGNOSIS — Z01818 Encounter for other preprocedural examination: Secondary | ICD-10-CM | POA: Diagnosis not present

## 2017-06-21 DIAGNOSIS — M545 Low back pain: Secondary | ICD-10-CM | POA: Diagnosis not present

## 2017-06-21 DIAGNOSIS — M5136 Other intervertebral disc degeneration, lumbar region: Secondary | ICD-10-CM | POA: Diagnosis not present

## 2017-06-23 DIAGNOSIS — M545 Low back pain: Secondary | ICD-10-CM | POA: Diagnosis not present

## 2017-06-23 DIAGNOSIS — M5136 Other intervertebral disc degeneration, lumbar region: Secondary | ICD-10-CM | POA: Diagnosis not present

## 2017-06-23 DIAGNOSIS — M62830 Muscle spasm of back: Secondary | ICD-10-CM | POA: Diagnosis not present

## 2017-06-23 DIAGNOSIS — M9903 Segmental and somatic dysfunction of lumbar region: Secondary | ICD-10-CM | POA: Diagnosis not present

## 2017-06-27 MED FILL — METOPROLOL TARTRATE 25 MG T: 25 | 30 days supply | Qty: 60 | Fill #2

## 2017-06-29 DIAGNOSIS — Z0000 Encounter for general adult medical examination without abnormal findings: Secondary | ICD-10-CM | POA: Diagnosis not present

## 2017-06-29 DIAGNOSIS — M9903 Segmental and somatic dysfunction of lumbar region: Secondary | ICD-10-CM | POA: Diagnosis not present

## 2017-06-29 DIAGNOSIS — Z79899 Other long term (current) drug therapy: Secondary | ICD-10-CM | POA: Diagnosis not present

## 2017-06-29 DIAGNOSIS — M62830 Muscle spasm of back: Secondary | ICD-10-CM | POA: Diagnosis not present

## 2017-06-29 DIAGNOSIS — E559 Vitamin D deficiency, unspecified: Secondary | ICD-10-CM | POA: Diagnosis not present

## 2017-06-29 DIAGNOSIS — L574 Cutis laxa senilis: Secondary | ICD-10-CM | POA: Diagnosis not present

## 2017-06-29 DIAGNOSIS — M5136 Other intervertebral disc degeneration, lumbar region: Secondary | ICD-10-CM | POA: Diagnosis not present

## 2017-06-29 DIAGNOSIS — Z131 Encounter for screening for diabetes mellitus: Secondary | ICD-10-CM | POA: Diagnosis not present

## 2017-06-29 DIAGNOSIS — K5909 Other constipation: Secondary | ICD-10-CM | POA: Diagnosis not present

## 2017-06-29 DIAGNOSIS — G4700 Insomnia, unspecified: Secondary | ICD-10-CM | POA: Diagnosis not present

## 2017-06-29 DIAGNOSIS — M545 Low back pain: Secondary | ICD-10-CM | POA: Diagnosis not present

## 2017-06-29 DIAGNOSIS — E785 Hyperlipidemia, unspecified: Secondary | ICD-10-CM | POA: Diagnosis not present

## 2017-06-29 DIAGNOSIS — Z6828 Body mass index (BMI) 28.0-28.9, adult: Secondary | ICD-10-CM | POA: Diagnosis not present

## 2017-06-29 MED FILL — LINZESS 72 MCG CAPSULE: 72 | 90 days supply | Qty: 90 | Fill #0

## 2017-07-04 DIAGNOSIS — Z8249 Family history of ischemic heart disease and other diseases of the circulatory system: Secondary | ICD-10-CM | POA: Diagnosis not present

## 2017-07-04 DIAGNOSIS — Z01818 Encounter for other preprocedural examination: Secondary | ICD-10-CM | POA: Diagnosis not present

## 2017-07-04 DIAGNOSIS — R7989 Other specified abnormal findings of blood chemistry: Secondary | ICD-10-CM | POA: Diagnosis not present

## 2017-07-05 DIAGNOSIS — M5136 Other intervertebral disc degeneration, lumbar region: Secondary | ICD-10-CM | POA: Diagnosis not present

## 2017-07-05 DIAGNOSIS — M545 Low back pain: Secondary | ICD-10-CM | POA: Diagnosis not present

## 2017-07-05 DIAGNOSIS — M9903 Segmental and somatic dysfunction of lumbar region: Secondary | ICD-10-CM | POA: Diagnosis not present

## 2017-07-05 DIAGNOSIS — M62830 Muscle spasm of back: Secondary | ICD-10-CM | POA: Diagnosis not present

## 2017-07-13 DIAGNOSIS — R197 Diarrhea, unspecified: Secondary | ICD-10-CM | POA: Diagnosis not present

## 2017-07-13 DIAGNOSIS — M546 Pain in thoracic spine: Secondary | ICD-10-CM | POA: Diagnosis not present

## 2017-07-16 ENCOUNTER — Ambulatory Visit
Admission: RE | Admit: 2017-07-16 | Discharge: 2017-07-16 | Disposition: A | Discharge status: Home / Self Care | Payer: 59 | Source: Ambulatory Visit | Attending: Family Medicine | Admitting: Family Medicine

## 2017-07-16 ENCOUNTER — Other Ambulatory Visit: Payer: Self-pay | Admitting: Family Medicine

## 2017-07-16 DIAGNOSIS — M549 Dorsalgia, unspecified: Secondary | ICD-10-CM

## 2017-07-16 DIAGNOSIS — M546 Pain in thoracic spine: Secondary | ICD-10-CM | POA: Diagnosis not present

## 2017-07-18 DIAGNOSIS — R7989 Other specified abnormal findings of blood chemistry: Secondary | ICD-10-CM | POA: Diagnosis not present

## 2017-07-18 DIAGNOSIS — E782 Mixed hyperlipidemia: Secondary | ICD-10-CM | POA: Diagnosis not present

## 2017-07-18 DIAGNOSIS — Z01818 Encounter for other preprocedural examination: Secondary | ICD-10-CM | POA: Diagnosis not present

## 2017-07-18 DIAGNOSIS — Z90710 Acquired absence of both cervix and uterus: Secondary | ICD-10-CM | POA: Diagnosis not present

## 2017-07-18 DIAGNOSIS — N952 Postmenopausal atrophic vaginitis: Secondary | ICD-10-CM | POA: Diagnosis not present

## 2017-07-18 DIAGNOSIS — M549 Dorsalgia, unspecified: Secondary | ICD-10-CM | POA: Diagnosis not present

## 2017-07-18 DIAGNOSIS — N393 Stress incontinence (female) (male): Secondary | ICD-10-CM | POA: Diagnosis not present

## 2017-07-18 DIAGNOSIS — E119 Type 2 diabetes mellitus without complications: Secondary | ICD-10-CM | POA: Diagnosis not present

## 2017-07-18 DIAGNOSIS — E039 Hypothyroidism, unspecified: Secondary | ICD-10-CM | POA: Diagnosis not present

## 2017-07-18 DIAGNOSIS — R350 Frequency of micturition: Secondary | ICD-10-CM | POA: Diagnosis not present

## 2017-07-18 DIAGNOSIS — J45990 Exercise induced bronchospasm: Secondary | ICD-10-CM | POA: Diagnosis not present

## 2017-07-18 DIAGNOSIS — I10 Essential (primary) hypertension: Secondary | ICD-10-CM | POA: Diagnosis not present

## 2017-07-18 DIAGNOSIS — M546 Pain in thoracic spine: Secondary | ICD-10-CM | POA: Diagnosis not present

## 2017-07-18 MED FILL — ESTRADIOL 0.1 MG/GM CREA: 0.1 | 60 days supply | Qty: 43 | Fill #0

## 2017-07-19 DIAGNOSIS — M9903 Segmental and somatic dysfunction of lumbar region: Secondary | ICD-10-CM | POA: Diagnosis not present

## 2017-07-19 DIAGNOSIS — M62830 Muscle spasm of back: Secondary | ICD-10-CM | POA: Diagnosis not present

## 2017-07-19 DIAGNOSIS — M545 Low back pain: Secondary | ICD-10-CM | POA: Diagnosis not present

## 2017-07-19 DIAGNOSIS — M5136 Other intervertebral disc degeneration, lumbar region: Secondary | ICD-10-CM | POA: Diagnosis not present

## 2017-07-23 DIAGNOSIS — M62830 Muscle spasm of back: Secondary | ICD-10-CM | POA: Diagnosis not present

## 2017-07-23 DIAGNOSIS — M5136 Other intervertebral disc degeneration, lumbar region: Secondary | ICD-10-CM | POA: Diagnosis not present

## 2017-07-23 DIAGNOSIS — M545 Low back pain: Secondary | ICD-10-CM | POA: Diagnosis not present

## 2017-07-23 DIAGNOSIS — M9903 Segmental and somatic dysfunction of lumbar region: Secondary | ICD-10-CM | POA: Diagnosis not present

## 2017-07-24 DIAGNOSIS — M545 Low back pain: Secondary | ICD-10-CM | POA: Diagnosis not present

## 2017-07-24 DIAGNOSIS — M9903 Segmental and somatic dysfunction of lumbar region: Secondary | ICD-10-CM | POA: Diagnosis not present

## 2017-07-24 DIAGNOSIS — M5136 Other intervertebral disc degeneration, lumbar region: Secondary | ICD-10-CM | POA: Diagnosis not present

## 2017-07-24 DIAGNOSIS — M62830 Muscle spasm of back: Secondary | ICD-10-CM | POA: Diagnosis not present

## 2017-07-25 MED FILL — METOPROLOL TARTRATE 25 MG T: 25 | 30 days supply | Qty: 60 | Fill #3

## 2017-07-25 MED FILL — clonazePAM 1 MG TABS: 1 | 30 days supply | Qty: 30 | Fill #0

## 2017-07-25 MED FILL — ESCITALOPRAM 20 MG TABLET: 20 | 90 days supply | Qty: 90 | Fill #0

## 2017-07-26 DIAGNOSIS — M62830 Muscle spasm of back: Secondary | ICD-10-CM | POA: Diagnosis not present

## 2017-07-26 DIAGNOSIS — M5136 Other intervertebral disc degeneration, lumbar region: Secondary | ICD-10-CM | POA: Diagnosis not present

## 2017-07-26 DIAGNOSIS — M545 Low back pain: Secondary | ICD-10-CM | POA: Diagnosis not present

## 2017-07-26 DIAGNOSIS — M9903 Segmental and somatic dysfunction of lumbar region: Secondary | ICD-10-CM | POA: Diagnosis not present

## 2017-07-26 MED FILL — AZELASTINE HCL 0.05% DROPS: 0.05 | 30 days supply | Qty: 6 | Fill #0

## 2017-07-30 DIAGNOSIS — M9903 Segmental and somatic dysfunction of lumbar region: Secondary | ICD-10-CM | POA: Diagnosis not present

## 2017-07-30 DIAGNOSIS — M5136 Other intervertebral disc degeneration, lumbar region: Secondary | ICD-10-CM | POA: Diagnosis not present

## 2017-07-30 DIAGNOSIS — M62830 Muscle spasm of back: Secondary | ICD-10-CM | POA: Diagnosis not present

## 2017-07-30 DIAGNOSIS — M545 Low back pain: Secondary | ICD-10-CM | POA: Diagnosis not present

## 2017-07-31 ENCOUNTER — Other Ambulatory Visit: Payer: Self-pay | Admitting: Family Medicine

## 2017-07-31 ENCOUNTER — Encounter: Payer: Self-pay | Admitting: Interventional Cardiology

## 2017-07-31 ENCOUNTER — Ambulatory Visit (INDEPENDENT_AMBULATORY_CARE_PROVIDER_SITE_OTHER): Payer: 59 | Admitting: Interventional Cardiology

## 2017-07-31 VITALS — BP 114/78 | HR 64 | Ht 62.5 in | Wt 154.8 lb

## 2017-07-31 DIAGNOSIS — I471 Supraventricular tachycardia: Secondary | ICD-10-CM

## 2017-07-31 DIAGNOSIS — M546 Pain in thoracic spine: Secondary | ICD-10-CM

## 2017-07-31 DIAGNOSIS — M5090 Cervical disc disorder, unspecified, unspecified cervical region: Secondary | ICD-10-CM

## 2017-07-31 DIAGNOSIS — H02844 Edema of left upper eyelid: Secondary | ICD-10-CM | POA: Diagnosis not present

## 2017-07-31 DIAGNOSIS — H02845 Edema of left lower eyelid: Secondary | ICD-10-CM | POA: Diagnosis not present

## 2017-07-31 MED FILL — MOXIFLOXACIN 0.5% EYE DROPS: 0.5 | 15 days supply | Qty: 3 | Fill #0

## 2017-07-31 MED FILL — DOXYCYCLINE HYCLATE 100 MG: 100 | 10 days supply | Qty: 20 | Fill #0

## 2017-07-31 NOTE — Progress Notes (Signed)
Cardiology Office Note    Date:  07/31/2017   ID:  Emily Calhoun, DOB 1956-01-25, MRN [id number]  PCP:  [name], MD  Cardiologist: Sinclair Grooms, MD   Chief Complaint  Patient presents with  . Irregular Heart Beat    PSVT    History of Present Illness:  [name] is a 62 y.o. female with history of palpitations, documented PSVT, and reasonable control on low-dose beta-blocker therapy.  Many fewer episodes of prolonged debilitating tachycardia.  Brief episodes some of which are precipitated by heavy physical workout routines.  When she feels tachycardia she stops and rests.  These episodes are very rare compared to her status 1 year ago.  She has not had syncope.  No chest pain.  Known widely patent coronaries by coronary CT angiography.   Past Medical History:  Diagnosis Date  . Anal fissure   . Anxiety   . Asthma   . Asthma   . Autoimmune disease ([place])   . Cervical cancer [facility])    mom took DES  . Hepatitis A   . History of kidney stones   . Hyperlipidemia   . 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 IBS (irritable bowel syndrome)   . Migraine   . Migraine   . STD (sexually transmitted disease)    HSV II  . Sweet's disease   . Thyroid disease    hypothyroid    Past Surgical History:  Procedure Laterality Date  . ABDOMINAL HYSTERECTOMY  1988   ?retained ovaries for cx ca  . blephoplasty    . BREAST LUMPECTOMY Left 2009   benign; ?cyst  . BREAST REDUCTION SURGERY Bilateral 09/2010  . ELBOW SURGERY Right 1995   tendon repair  . ELBOW SURGERY Right   . EXCISION VAGINAL CYST  10-25-2009   benign squamous cyst  . FOOT SURGERY Right   . internal hemorrhoidectomy    . NASAL SINUS SURGERY     x 2    Current Medications: Outpatient Medications Prior to Visit  Medication Sig Dispense Refill  . acyclovir (ZOVIRAX) 400 MG tablet Take 1 tablet (400 mg total) by mouth daily. 90 tablet 4  . albuterol (PROAIR HFA) 108 (90 BASE) MCG/ACT inhaler  Inhale 2 puffs into the lungs every 6 (six) hours as needed for wheezing or shortness of breath. 1 Inhaler 3  . clonazePAM (KLONOPIN) 1 MG tablet Take 0.5 to 1 mg by mouth daily at bedtime as needed for sleep.    Marland Kitchen doxycycline (VIBRAMYCIN) 100 MG capsule Take 100 mg by mouth 2 (two) times daily.    Marland Kitchen escitalopram (LEXAPRO) 20 MG tablet Take 20 mg by mouth daily.   3  . esomeprazole (NEXIUM) 40 MG capsule Take 1 capsule (40 mg total) by mouth daily. 30 capsule 3  . estradiol (VIVELLE-DOT) 0.075 MG/24HR PLACE 1 PATCH ONTO THE SKIN 2 TIMES A WEEK.    . gabapentin (NEURONTIN) 300 MG capsule Take 300 mg by mouth 3 (three) times daily.  2  . levothyroxine (SYNTHROID, LEVOTHROID) 75 MCG tablet Take 75 mcg by mouth daily before breakfast.    . linaclotide (LINZESS) 72 MCG capsule Take 1 capsule (72 mcg total) daily by mouth. 30 capsule 0  . metoprolol tartrate (LOPRESSOR) 25 MG tablet Take 1 tablet (25 mg total) by mouth 2 (two) times daily. 60 tablet 3  . Multiple Vitamins-Minerals (MULTIVITAMIN WITH MINERALS) tablet Take 1 tablet by mouth daily.     Marland Kitchen  Lifitegrast (XIIDRA) 5 % SOLN Place 1 drop into both eyes 2 (two) times daily.      No facility-administered medications prior to visit.      Allergies:   Azithromycin; Cephalexin; Cephalosporins; Lifitegrast; Metronidazole; Penicillins; Sulfonamide derivatives; Clarithromycin; Effexor [venlafaxine]; Gentamicin sulfate; Ketoconazole; Macrobid [nitrofurantoin]; Morphine sulfate; Singulair [montelukast sodium]; Tobramycin-dexamethasone; and Wellbutrin [bupropion]   Social History   Socioeconomic History  . Marital status: Married    Spouse name: None  . Number of children: 2  . Years of education: None  . Highest education level: None  Social Needs  . Financial resource strain: None  . Food insecurity - worry: None  . Food insecurity - inability: None  . Transportation needs - medical: None  . Transportation needs - non-medical: None    Occupational History  . Occupation: Merchandiser, retail: UNEMPLOYED  Tobacco Use  . Smoking status: Never Smoker  . Smokeless tobacco: Never Used  Substance and Sexual Activity  . Alcohol use: Yes    Alcohol/week: 4.8 oz    Types: 8 Standard drinks or equivalent per week    Comment: 2 glasses wine nightly.    . Drug use: No  . Sexual activity: Yes    Partners: Male    Birth control/protection: Surgical    Comment: TAH  Other Topics Concern  . None  Social History Narrative   Lives with husband in a 2 story home.  Has 2 children.     Works as an Futures trader.    Education: college.     Family History:  The patient's family history includes Allergies in her mother; Clotting disorder in her father; Diabetes in her father and sister; Heart disease in her father and mother; Other in her son; Thyroid disease in her father and sister.   ROS:   Please see the history of present illness.    Low back discomfort, muscle pain, vision disturbance, hearing loss, occasional palpitations but no prolonged tachycardia since starting beta-blocker therapy. All other systems reviewed and are negative.   PHYSICAL EXAM:   VS:  BP 114/78   Pulse 64   Ht 5' 2.5" (1.588 m)   Wt 154 lb 12.8 oz (70.2 kg)   LMP 07/17/1986   BMI 27.86 kg/m    GEN: Well nourished, well developed, in no acute distress  HEENT: normal  Neck: no JVD, carotid bruits, or masses Cardiac: RRR; no murmurs, rubs, or gallops,no edema  Respiratory:  clear to auscultation bilaterally, normal work of breathing GI: soft, nontender, nondistended, + BS MS: no deformity or atrophy  Skin: warm and dry, no rash Neuro:  Alert and Oriented x 3, Strength and sensation are intact Psych: euthymic mood, full affect  Wt Readings from Last 3 Encounters:  07/31/17 154 lb 12.8 oz (70.2 kg)  02/23/17 155 lb 6.4 oz (70.5 kg)  01/25/17 156 lb (70.8 kg)      Studies/Labs Reviewed:   EKG:  EKG not repeated  Recent Labs: No  results found for requested labs within last 8760 hours.   Lipid Panel No results found for: CHOL, TRIG, HDL, CHOLHDL, VLDL, LDLCALC, LDLDIRECT  Additional studies/ records that were reviewed today include:  No new data    ASSESSMENT:    1. PSVT (paroxysmal supraventricular tachycardia) (HCC)      PLAN:  In order of problems listed above:  1. Stable on low-dose beta-blocker therapy.  No change in regimen at this time.  Reiterated the possibility of AV node ablation  to cure if symptoms become uncontrollable on medical therapy.  Clinical follow-up in 1 year.    Medication Adjustments/Labs and Tests Ordered: Current medicines are reviewed at length with the patient today.  Concerns regarding medicines are outlined above.  Medication changes, Labs and Tests ordered today are listed in the Patient Instructions below. Patient Instructions  Medication Instructions:  Your physician recommends that you continue on your current medications as directed. Please refer to the Current Medication list given to you today.   Labwork: None   Testing/Procedures: None   Follow-Up: Your physician recommends that you schedule a follow-up appointment in: 1 year with Dr. Tamala Julian.   Any Other Special Instructions Will Be Listed Below (If Applicable).     If you need a refill on your cardiac medications before your next appointment, please call your pharmacy. '    Signed, Sinclair Grooms, MD  07/31/2017 4:43 PM    [address] [place], [place], [place]  [address] Phone: [phone]; Fax: [phone]

## 2017-07-31 NOTE — Patient Instructions (Signed)
Medication Instructions:  Your physician recommends that you continue on your current medications as directed. Please refer to the Current Medication list given to you today.   Labwork: None   Testing/Procedures: None   Follow-Up: Your physician recommends that you schedule a follow-up appointment in: 1 year with Dr. Tamala Julian.   Any Other Special Instructions Will Be Listed Below (If Applicable).     If you need a refill on your cardiac medications before your next appointment, please call your pharmacy. '

## 2017-08-01 ENCOUNTER — Encounter: Payer: 59 | Admitting: Internal Medicine

## 2017-08-02 DIAGNOSIS — M545 Low back pain: Secondary | ICD-10-CM | POA: Diagnosis not present

## 2017-08-02 DIAGNOSIS — M5136 Other intervertebral disc degeneration, lumbar region: Secondary | ICD-10-CM | POA: Diagnosis not present

## 2017-08-02 DIAGNOSIS — M62830 Muscle spasm of back: Secondary | ICD-10-CM | POA: Diagnosis not present

## 2017-08-02 DIAGNOSIS — M9903 Segmental and somatic dysfunction of lumbar region: Secondary | ICD-10-CM | POA: Diagnosis not present

## 2017-08-05 ENCOUNTER — Ambulatory Visit
Admission: RE | Admit: 2017-08-05 | Discharge: 2017-08-05 | Disposition: A | Discharge status: Home / Self Care | Payer: 59 | Source: Ambulatory Visit | Attending: Family Medicine | Admitting: Family Medicine

## 2017-08-05 DIAGNOSIS — M5090 Cervical disc disorder, unspecified, unspecified cervical region: Secondary | ICD-10-CM

## 2017-08-05 DIAGNOSIS — M546 Pain in thoracic spine: Secondary | ICD-10-CM

## 2017-08-05 DIAGNOSIS — M4804 Spinal stenosis, thoracic region: Secondary | ICD-10-CM | POA: Diagnosis not present

## 2017-08-06 DIAGNOSIS — M9903 Segmental and somatic dysfunction of lumbar region: Secondary | ICD-10-CM | POA: Diagnosis not present

## 2017-08-06 DIAGNOSIS — M545 Low back pain: Secondary | ICD-10-CM | POA: Diagnosis not present

## 2017-08-06 DIAGNOSIS — M62830 Muscle spasm of back: Secondary | ICD-10-CM | POA: Diagnosis not present

## 2017-08-06 DIAGNOSIS — M5136 Other intervertebral disc degeneration, lumbar region: Secondary | ICD-10-CM | POA: Diagnosis not present

## 2017-08-07 DIAGNOSIS — M9903 Segmental and somatic dysfunction of lumbar region: Secondary | ICD-10-CM | POA: Diagnosis not present

## 2017-08-07 DIAGNOSIS — M62830 Muscle spasm of back: Secondary | ICD-10-CM | POA: Diagnosis not present

## 2017-08-07 DIAGNOSIS — M545 Low back pain: Secondary | ICD-10-CM | POA: Diagnosis not present

## 2017-08-07 DIAGNOSIS — M5136 Other intervertebral disc degeneration, lumbar region: Secondary | ICD-10-CM | POA: Diagnosis not present

## 2017-08-08 ENCOUNTER — Other Ambulatory Visit: Payer: 59

## 2017-08-09 DIAGNOSIS — M545 Low back pain: Secondary | ICD-10-CM | POA: Diagnosis not present

## 2017-08-09 DIAGNOSIS — M62830 Muscle spasm of back: Secondary | ICD-10-CM | POA: Diagnosis not present

## 2017-08-09 DIAGNOSIS — B009 Herpesviral infection, unspecified: Secondary | ICD-10-CM | POA: Diagnosis not present

## 2017-08-09 DIAGNOSIS — R202 Paresthesia of skin: Secondary | ICD-10-CM | POA: Diagnosis not present

## 2017-08-09 DIAGNOSIS — M5136 Other intervertebral disc degeneration, lumbar region: Secondary | ICD-10-CM | POA: Diagnosis not present

## 2017-08-09 DIAGNOSIS — M9903 Segmental and somatic dysfunction of lumbar region: Secondary | ICD-10-CM | POA: Diagnosis not present

## 2017-08-09 DIAGNOSIS — M5093 Cervical disc disorder, unspecified, cervicothoracic region: Secondary | ICD-10-CM | POA: Diagnosis not present

## 2017-08-09 MED FILL — GABAPENTIN 300 MG CAPSULE: 300 | 90 days supply | Qty: 270 | Fill #0

## 2017-08-09 MED FILL — ACYCLOVIR 400 MG TABLET: 400 | 90 days supply | Qty: 90 | Fill #0

## 2017-08-13 DIAGNOSIS — M545 Low back pain: Secondary | ICD-10-CM | POA: Diagnosis not present

## 2017-08-13 DIAGNOSIS — M5136 Other intervertebral disc degeneration, lumbar region: Secondary | ICD-10-CM | POA: Diagnosis not present

## 2017-08-13 DIAGNOSIS — M9903 Segmental and somatic dysfunction of lumbar region: Secondary | ICD-10-CM | POA: Diagnosis not present

## 2017-08-13 DIAGNOSIS — M62830 Muscle spasm of back: Secondary | ICD-10-CM | POA: Diagnosis not present

## 2017-08-15 DIAGNOSIS — M62830 Muscle spasm of back: Secondary | ICD-10-CM | POA: Diagnosis not present

## 2017-08-15 DIAGNOSIS — M5136 Other intervertebral disc degeneration, lumbar region: Secondary | ICD-10-CM | POA: Diagnosis not present

## 2017-08-15 DIAGNOSIS — M545 Low back pain: Secondary | ICD-10-CM | POA: Diagnosis not present

## 2017-08-15 DIAGNOSIS — M9903 Segmental and somatic dysfunction of lumbar region: Secondary | ICD-10-CM | POA: Diagnosis not present

## 2017-08-17 DIAGNOSIS — M5136 Other intervertebral disc degeneration, lumbar region: Secondary | ICD-10-CM | POA: Diagnosis not present

## 2017-08-17 DIAGNOSIS — M545 Low back pain: Secondary | ICD-10-CM | POA: Diagnosis not present

## 2017-08-17 DIAGNOSIS — M62830 Muscle spasm of back: Secondary | ICD-10-CM | POA: Diagnosis not present

## 2017-08-17 DIAGNOSIS — M9903 Segmental and somatic dysfunction of lumbar region: Secondary | ICD-10-CM | POA: Diagnosis not present

## 2017-08-21 DIAGNOSIS — M545 Low back pain: Secondary | ICD-10-CM | POA: Diagnosis not present

## 2017-08-21 DIAGNOSIS — M9903 Segmental and somatic dysfunction of lumbar region: Secondary | ICD-10-CM | POA: Diagnosis not present

## 2017-08-21 DIAGNOSIS — M5136 Other intervertebral disc degeneration, lumbar region: Secondary | ICD-10-CM | POA: Diagnosis not present

## 2017-08-21 DIAGNOSIS — M62830 Muscle spasm of back: Secondary | ICD-10-CM | POA: Diagnosis not present

## 2017-09-03 ENCOUNTER — Telehealth (INDEPENDENT_AMBULATORY_CARE_PROVIDER_SITE_OTHER): Payer: Self-pay | Admitting: Orthopaedic Surgery

## 2017-09-03 ENCOUNTER — Ambulatory Visit (INDEPENDENT_AMBULATORY_CARE_PROVIDER_SITE_OTHER): Payer: 59 | Admitting: Physician Assistant

## 2017-09-03 ENCOUNTER — Encounter (INDEPENDENT_AMBULATORY_CARE_PROVIDER_SITE_OTHER): Payer: Self-pay | Admitting: Physician Assistant

## 2017-09-03 ENCOUNTER — Other Ambulatory Visit (INDEPENDENT_AMBULATORY_CARE_PROVIDER_SITE_OTHER): Payer: Self-pay

## 2017-09-03 ENCOUNTER — Ambulatory Visit (INDEPENDENT_AMBULATORY_CARE_PROVIDER_SITE_OTHER): Payer: 59

## 2017-09-03 DIAGNOSIS — M25562 Pain in left knee: Secondary | ICD-10-CM | POA: Diagnosis not present

## 2017-09-03 DIAGNOSIS — M25511 Pain in right shoulder: Secondary | ICD-10-CM | POA: Diagnosis not present

## 2017-09-03 DIAGNOSIS — M25521 Pain in right elbow: Secondary | ICD-10-CM

## 2017-09-03 MED ORDER — MELOXICAM 7.5 MG PO TABS: ORAL_TABLET | ORAL | 2 refills | Status: DC

## 2017-09-03 MED ORDER — TRAMADOL HCL 50 MG PO TABS: 50.0000 mg | ORAL_TABLET | Freq: Four times a day (QID) | ORAL | 0 refills | Status: DC | PRN

## 2017-09-03 MED FILL — traMADol HCL 50 MG TABS: 50 | 8 days supply | Qty: 30 | Fill #0

## 2017-09-03 MED FILL — MELOXICAM 7.5 MG TABLET: 7.5 | 30 days supply | Qty: 30 | Fill #0

## 2017-09-03 NOTE — Telephone Encounter (Signed)
Patients husband called on behalf of patient stating that the tramadol was received at the pharmacy but the meloxicam was not, if it could be sent in again please. Thank you. CB # [phone]

## 2017-09-03 NOTE — Progress Notes (Signed)
Office Visit Note   Patient: Emily Calhoun           Date of Birth: 10-18-1955           MRN: [id number] Visit Date: 09/03/2017              Requested by: Emily Jordan, MD [address] [place], [place] [address] PCP: Emily Jordan, MD   Assessment & Plan: Visit Diagnoses:  1. Acute pain of left knee   2. Pain in right elbow   3. Acute pain of right shoulder     Plan: Impression is #1 right forearm contusion, #2 right shoulder pain, #3 left knee medial meniscus tear.  In regards to the right forearm and shoulder, I think these will resolve with time ice and heat.  In regards to the left knee, we are going to go ahead and obtain an MRI to assess her medial meniscus.  She already has an appointment with another physician in town tomorrow morning  for further management.  Follow-Up Instructions: Return for after MRI.   Orders:  Orders Placed This Encounter  Procedures  . XR KNEE 3 VIEW LEFT  . XR Elbow 2 Views Right  . XR Shoulder Right  . MR Knee Left w/o contrast   Meds ordered this encounter  Medications  . traMADol (ULTRAM) 50 MG tablet    Sig: Take 1 tablet (50 mg total) by mouth every 6 (six) hours as needed.    Dispense:  30 tablet    Refill:  0    Order Specific Question:   Supervising Provider    Answer:   Emily Calhoun [[id number]]  . DISCONTD: meloxicam (MOBIC) 7.5 MG tablet    Sig: Take one tab po qd prn pain    Dispense:  30 tablet    Refill:  2    Order Specific Question:   Supervising Provider    Answer:   Emily Calhoun [phone]  . DISCONTD: meloxicam (MOBIC) 7.5 MG tablet    Sig: Take one tab po qd prn pain    Dispense:  30 tablet    Refill:  2      Procedures: No procedures performed   Clinical Data: No additional findings.   Subjective: Chief Complaint  Patient presents with  . Left Knee - Pain    HPI Emily Calhoun is a pleasant 62 year old female new patient who presents our clinic today after falling off of a horse this past  Saturday, 09/01/2017.  She thinks she fell landing on her right elbow.  Since then, she has had marked pain to the right elbow, right shoulder and left knee.  In regards to the right upper extremity, she is having pain to the top of the shoulder and into the side of the neck.  This seems to feel like a pulled muscle with associated swelling.  The right elbow has moderate swelling and ecchymosis down into the  middle of the forearm.  This is just more tender over the bruise than anything.  Of note, she does have a history of surgical intervention to what sounds like the extensor tendons back in 1998.  In regards to the left knee, she has marked pain to the medial aspect.  This is worse with pivoting, squatting, and flexion of the knee.  No mechanical symptoms.  Review of Systems as detailed in HPI.  All others reviewed and are negative.   Objective: Vital Signs: LMP 07/17/1986   Physical Exam  well-developed well-nourished female in no acute distress.  Alert and oriented x3.  Ortho Exam examination of her right shoulder reveals full active range of motion.  Negative drop arm negative empty can.  Examination of her elbow reveals moderate swelling and ecchymosis.  She has diffuse tenderness around the ecchymosis.  Full range of motion.  Left knee, 1+ effusion.  Marked tenderness medial joint line with a positive medial McMurray.  Moderate patellofemoral crepitus.  Ligaments are stable.  She is neurovascular intact distally.  Specialty Comments:  No specialty comments available.  Imaging: Xr Elbow 2 Views Right  Result Date: 09/03/2017 X-rays of the right elbow are negative for structural abnormalities  Xr Knee 3 View Left  Result Date: 09/03/2017 X-rays of the left knee show moderate joint space narrowing medial compartment  Xr Shoulder Right  Result Date: 09/03/2017 X-rays of the right shoulder are negative for structural abnormalities    PMFS History: Patient Active Problem List    Diagnosis Date Noted  . Pain in right elbow 09/03/2017  . Acute pain of right shoulder 09/03/2017  . PSVT (paroxysmal supraventricular tachycardia) ([place]) 02/22/2017  . Dyspnea 01/25/2017  . Chronic fatigue 07/24/2013  . Acute pain of left knee 07/24/2013  . Depression 03/12/2013  . Insomnia 03/12/2013  . Rash 02/21/2011  . ALLERGIC RHINITIS 08/18/2010  . CERVICAL CANCER, HX OF 11/02/2009  . CERVICAL CANCER 10/26/2009  . HYPOTHYROIDISM 10/26/2009  . 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 SINUSITIS, RECURRENT 10/26/2009  . Asthma 10/26/2009  . LOW BACK PAIN, CHRONIC 10/26/2009  . HEMORRHOIDS, HX OF 10/26/2009  . PERSONAL HISTORY OF URINARY CALCULI 10/26/2009   Past Medical History:  Diagnosis Date  . Anal fissure   . Anxiety   . Asthma   . Asthma   . Autoimmune disease ([place])   . Cervical cancer [facility])    mom took DES  . Hepatitis A   . History of kidney stones   . Hyperlipidemia   . 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 IBS (irritable bowel syndrome)   . Migraine   . Migraine   . STD (sexually transmitted disease)    HSV II  . Sweet's disease   . Thyroid disease    hypothyroid    Family History  Problem Relation Age of Onset  . Allergies Mother   . Heart disease Mother   . Heart disease Father   . Clotting disorder Father        per pt had clot in is intestines  . Diabetes Father   . Thyroid disease Father   . Diabetes Sister   . Thyroid disease Sister   . Other Son        Hypogammaglobulinemia  . Colon cancer Neg Hx   . Esophageal cancer Neg Hx   . Stomach cancer Neg Hx   . Pancreatic cancer Neg Hx   . Liver disease Neg Hx     Past Surgical History:  Procedure Laterality Date  . ABDOMINAL HYSTERECTOMY  1988   ?retained ovaries for cx ca  . blephoplasty    . BREAST LUMPECTOMY Left 2009   benign; ?cyst  . BREAST REDUCTION SURGERY Bilateral 09/2010  . ELBOW SURGERY Right 1995   tendon repair  . ELBOW SURGERY Right   . EXCISION VAGINAL CYST   10-25-2009   benign squamous cyst  . FOOT SURGERY Right   . internal hemorrhoidectomy    . NASAL SINUS SURGERY     x 2   Social History  Occupational History  . Occupation: Merchandiser, retail: UNEMPLOYED  Tobacco Use  . Smoking status: Never Smoker  . Smokeless tobacco: Never Used  Substance and Sexual Activity  . Alcohol use: Yes    Alcohol/week: 4.8 oz    Types: 8 Standard drinks or equivalent per week    Comment: 2 glasses wine nightly.    . Drug use: No  . Sexual activity: Yes    Partners: Male    Birth control/protection: Surgical    Comment: TAH

## 2017-09-03 NOTE — Telephone Encounter (Signed)
Refaxed Rx. Called Husband.

## 2017-09-04 DIAGNOSIS — M47812 Spondylosis without myelopathy or radiculopathy, cervical region: Secondary | ICD-10-CM | POA: Diagnosis not present

## 2017-09-04 DIAGNOSIS — S83412A Sprain of medial collateral ligament of left knee, initial encounter: Secondary | ICD-10-CM | POA: Diagnosis not present

## 2017-09-05 DIAGNOSIS — M9903 Segmental and somatic dysfunction of lumbar region: Secondary | ICD-10-CM | POA: Diagnosis not present

## 2017-09-05 DIAGNOSIS — M62830 Muscle spasm of back: Secondary | ICD-10-CM | POA: Diagnosis not present

## 2017-09-05 DIAGNOSIS — M545 Low back pain: Secondary | ICD-10-CM | POA: Diagnosis not present

## 2017-09-05 DIAGNOSIS — M5136 Other intervertebral disc degeneration, lumbar region: Secondary | ICD-10-CM | POA: Diagnosis not present

## 2017-09-18 MED FILL — OXYBUTYNIN CL ER 5 MG TAB: 5 | 40 days supply | Qty: 60 | Fill #0

## 2017-09-20 ENCOUNTER — Other Ambulatory Visit: Payer: Self-pay | Admitting: Interventional Cardiology

## 2017-09-20 ENCOUNTER — Encounter: Payer: Self-pay | Admitting: Internal Medicine

## 2017-09-20 MED FILL — LEVOTHYROXINE 75 MCG TABLET: 75 | 90 days supply | Qty: 90 | Fill #1

## 2017-09-20 MED FILL — ESOMEPRAZOLE MAG DR 40 MG C: 40 | 90 days supply | Qty: 90 | Fill #1

## 2017-09-20 MED FILL — ESTRADIOL 0.05 MG PATCH: 0.05 | 28 days supply | Qty: 8 | Fill #0

## 2017-09-20 MED FILL — METOPROLOL TARTRATE 25 MG T: 25 | 30 days supply | Qty: 60 | Fill #0

## 2017-09-25 DIAGNOSIS — S83412D Sprain of medial collateral ligament of left knee, subsequent encounter: Secondary | ICD-10-CM | POA: Diagnosis not present

## 2017-09-29 DIAGNOSIS — M25562 Pain in left knee: Secondary | ICD-10-CM | POA: Diagnosis not present

## 2017-10-05 ENCOUNTER — Encounter: Payer: Self-pay | Admitting: Neurology

## 2017-10-05 ENCOUNTER — Ambulatory Visit (INDEPENDENT_AMBULATORY_CARE_PROVIDER_SITE_OTHER): Payer: 59 | Admitting: Neurology

## 2017-10-05 VITALS — BP 108/80 | HR 83 | Ht 62.5 in | Wt 154.5 lb

## 2017-10-05 DIAGNOSIS — S060X9A Concussion with loss of consciousness of unspecified duration, initial encounter: Secondary | ICD-10-CM | POA: Diagnosis not present

## 2017-10-05 NOTE — Patient Instructions (Signed)
Return to clinic as needed

## 2017-10-05 NOTE — Progress Notes (Signed)
Follow-up Visit   Date: 10/05/17    GLADYSE Calhoun MRN: [id number] DOB: 10/14/1955   Interim History: Emily Calhoun is a 62 y.o. left-handed Caucasian female with hypothyroidism, GERD, depression/anxiety, asthma, hypogammaglobulinemia, cervical cancer s/p hysterectomy (1998), chronic unspecified skin rash  returning to the clinic with new complaints of concussion.  The patient was accompanied to the clinic by self.  In late February, she was thrown off her horse and landed on her right side injuring her left knee and right shoulder.  She lost consciousness, but is unsure how long.  She was on the ground for 10 minutes.  She has severe pain of the right shoulder, arm, neck, and left knee.  EMS was called and she was treated for pain, she did not want to go to the ER. She was wearing her helmet. Since this time, she was having dull headaches, which have significantly improved.  She continues to have some difficulty with short-term memory, especially names and numbers.  No behavior changes or problems with concentration.  She has three prior concussions, which were all related to her riding horses. She continues to have a lot of generalized muscle pain and sorenes.   Medications:  Current Outpatient Medications on File Prior to Visit  Medication Sig Dispense Refill  . acyclovir (ZOVIRAX) 400 MG tablet Take 1 tablet (400 mg total) by mouth daily. 90 tablet 4  . albuterol (PROAIR HFA) 108 (90 BASE) MCG/ACT inhaler Inhale 2 puffs into the lungs every 6 (six) hours as needed for wheezing or shortness of breath. 1 Inhaler 3  . clonazePAM (KLONOPIN) 1 MG tablet Take 0.5 to 1 mg by mouth daily at bedtime as needed for sleep.    Marland Kitchen doxycycline (VIBRAMYCIN) 100 MG capsule Take 100 mg by mouth 2 (two) times daily.    Marland Kitchen escitalopram (LEXAPRO) 20 MG tablet Take 20 mg by mouth daily.   3  . esomeprazole (NEXIUM) 40 MG capsule Take 1 capsule (40 mg total) by mouth daily. 30 capsule 3  . estradiol  (VIVELLE-DOT) 0.075 MG/24HR PLACE 1 PATCH ONTO THE SKIN 2 TIMES A WEEK.    . gabapentin (NEURONTIN) 300 MG capsule Take 300 mg by mouth 3 (three) times daily.  2  . levothyroxine (SYNTHROID, LEVOTHROID) 75 MCG tablet Take 75 mcg by mouth daily before breakfast.    . linaclotide (LINZESS) 72 MCG capsule Take 1 capsule (72 mcg total) daily by mouth. 30 capsule 0  . meloxicam (MOBIC) 7.5 MG tablet Take one tab po qd prn pain 30 tablet 2  . metoprolol tartrate (LOPRESSOR) 25 MG tablet TAKE 1 TABLET BY MOUTH TWICE DAILY 60 tablet 9  . Multiple Vitamins-Minerals (MULTIVITAMIN WITH MINERALS) tablet Take 1 tablet by mouth daily.     Marland Kitchen oxybutynin (DITROPAN-XL) 5 MG 24 hr tablet Take by mouth.    . traMADol (ULTRAM) 50 MG tablet Take 1 tablet (50 mg total) by mouth every 6 (six) hours as needed. 30 tablet 0   No current facility-administered medications on file prior to visit.     Allergies:  Allergies  Allergen Reactions  . Azithromycin Anaphylaxis  . Cephalexin Anaphylaxis  . Cephalosporins Anaphylaxis  . Lifitegrast Other (See Comments)    "causes eye infection"  . Metronidazole Anaphylaxis  . Penicillins Anaphylaxis  . Sulfonamide Derivatives Anaphylaxis  . Clarithromycin   . Effexor [Venlafaxine] Other (See Comments)    hallucinations  . Gentamicin Sulfate     REACTION: all mycins  . Ketoconazole   .  Macrobid [Nitrofurantoin]   . Morphine Sulfate   . Singulair [Montelukast Sodium]     Nervousness and depression per pt  . Tobramycin-Dexamethasone   . Wellbutrin [Bupropion]     Hallucinations     Review of Systems:  CONSTITUTIONAL: No fevers, chills, night sweats, or weight loss.  EYES: No visual changes or eye pain ENT: No hearing changes.  No history of nose bleeds.   RESPIRATORY: No cough, wheezing and shortness of breath.   CARDIOVASCULAR: Negative for chest pain, and palpitations.   GI: Negative for abdominal discomfort, blood in stools or black stools.  No recent change  in bowel habits.   GU:  No history of incontinence.   MUSCLOSKELETAL: No history of joint pain or swelling.  +myalgias.   SKIN: Negative for lesions, rash, and itching.   ENDOCRINE: Negative for cold or heat intolerance, polydipsia or goiter.   PSYCH:  No depression or anxiety symptoms.   NEURO: As Above.   Vital Signs:  BP 108/80   Pulse 83   Ht 5' 2.5" (1.588 m)   Wt 154 lb 8 oz (70.1 kg)   LMP 07/17/1986   SpO2 98%   BMI 27.81 kg/m    General: Well appearing, comfortable Neck: No carotid bruit CV: regular rate and rhythm Ext: left knee is supported in a brace.  Neurological Exam: MENTAL STATUS including orientation to time, place, person, recent and remote memory, attention span and concentration, language, and fund of knowledge is normal.  Speech is not dysarthric. Montreal Cognitive Assessment  10/05/2017  Visuospatial/ Executive (0/5) 5  Naming (0/3) 3  Attention: Read list of digits (0/2) 2  Attention: Read list of letters (0/1) 1  Attention: Serial 7 subtraction starting at 100 (0/3) 1  Language: Repeat phrase (0/2) 2  Language : Fluency (0/1) 1  Abstraction (0/2) 2  Delayed Recall (0/5) 5  Orientation (0/6) 5  Total 27  Adjusted Score (based on education) 27    CRANIAL NERVES: No visual field defects.  Pupils equal round and reactive to light.  Normal conjugate, extra-ocular eye movements in all directions of gaze.  No ptosis. Normal facial sensation.  Face is symmetric. Palate elevates symmetrically.  Tongue is midline.  MOTOR:  Motor strength is 5/5 in all extremities.  No atrophy, fasciculations or abnormal movements.  No pronator drift.  Tone is normal.    MSRs:  Reflexes are 2+/4 throughout  SENSORY:  Intact to vibration throughout.  COORDINATION/GAIT:   Intact rapid alternating movements bilaterally.  Gait narrow based and stable.   Data: MRI thoracic spine wo contrast 10/05/2017:  Mild disc degeneration and spurring at T8-9 similar to the prior  study. Negative for stenosis.  NCS/EMG of the right arm and left leg 06/06/2016:  This is a normal study. In particular, there is no evidence of a right cervical radiculopathy, left lumbosacral radiculopathy, or generalized sensorimotor polyneuropathy.   IMPRESSION/PLAN: Concussion following horse riding injury with brief loss of consciousness.  She is recovering from this and has less frequent headaches, but her short-term memory continues to be impaired.  Neurological exam is non-focal. She scored very well on her cognitive screening exam today with 27/30, which is reassuring.  I offered cognitive behavior therapy and suggested cognitive exercises to do at home.  Because she has noticed gradual improvement, she prefers to do her own exercises.     Return to clinic as needed  The duration of this appointment visit was 30 minutes of face-to-face time with the  patient.  Greater than 50% of this time was spent in counseling, explanation of diagnosis, planning of further management, and coordination of care.   Thank you for allowing me to participate in patient's care.  If I can answer any additional questions, I would be pleased to do so.    Sincerely,    Renaldo Gornick K. Posey Pronto, DO

## 2017-10-08 DIAGNOSIS — E785 Hyperlipidemia, unspecified: Secondary | ICD-10-CM | POA: Diagnosis not present

## 2017-10-08 DIAGNOSIS — R7301 Impaired fasting glucose: Secondary | ICD-10-CM | POA: Diagnosis not present

## 2017-10-25 MED FILL — METOPROLOL TARTRATE 25 MG T: 25 | 90 days supply | Qty: 180 | Fill #1

## 2017-10-25 MED FILL — clonazePAM 1 MG TABS: 1 | 30 days supply | Qty: 30 | Fill #1

## 2017-10-25 MED FILL — ESCITALOPRAM 20 MG TABLET: 20 | 90 days supply | Qty: 90 | Fill #1

## 2017-10-26 ENCOUNTER — Telehealth: Payer: Self-pay

## 2017-10-26 NOTE — Telephone Encounter (Signed)
Preparing patients chart for PV, patient suffered a concussion on 10/05/17 after falling off a horse. I discussed this with Josh Monday  and he states she is safe to proceed with her scheduled procedure if she is not having any symptoms. I spoke with the patient today and she confirms that she is fine and does not have any symptoms after the accident. Patient was informed that we will proceed as scheduled.   Riki Sheer, LPN ( PV )

## 2017-11-07 ENCOUNTER — Ambulatory Visit (AMBULATORY_SURGERY_CENTER): Payer: Self-pay | Admitting: *Deleted

## 2017-11-07 ENCOUNTER — Other Ambulatory Visit: Payer: Self-pay

## 2017-11-07 ENCOUNTER — Encounter: Payer: Self-pay | Admitting: Internal Medicine

## 2017-11-07 VITALS — Ht 62.0 in | Wt 157.0 lb

## 2017-11-07 DIAGNOSIS — Z86010 Personal history of colonic polyps: Secondary | ICD-10-CM

## 2017-11-07 MED ORDER — NA SULFATE-K SULFATE-MG SULF 17.5-3.13-1.6 GM/177ML PO SOLN: 1.0000 | Freq: Once | ORAL | 0 refills | Status: AC

## 2017-11-07 MED FILL — SUPREP BOWEL PREP KIT: 17.5-3.13-1 | 1 days supply | Qty: 354 | Fill #0

## 2017-11-07 NOTE — Progress Notes (Signed)
Denies allergies to eggs or soy products. Denies complications with sedation or anesthesia. Denies O2 use. Denies use of diet or weight loss medications.  Emmi instructions given for colonoscopy.  

## 2017-11-08 DIAGNOSIS — Z1231 Encounter for screening mammogram for malignant neoplasm of breast: Secondary | ICD-10-CM | POA: Diagnosis not present

## 2017-11-08 MED FILL — ESTRADIOL 0.05 MG PATCH: 0.05 | 28 days supply | Qty: 8 | Fill #1

## 2017-11-08 MED FILL — ACYCLOVIR 400 MG TABLET: 400 | 90 days supply | Qty: 90 | Fill #1

## 2017-11-12 DIAGNOSIS — F330 Major depressive disorder, recurrent, mild: Secondary | ICD-10-CM | POA: Diagnosis not present

## 2017-11-21 ENCOUNTER — Other Ambulatory Visit: Payer: Self-pay

## 2017-11-21 ENCOUNTER — Encounter: Payer: Self-pay | Admitting: Internal Medicine

## 2017-11-21 ENCOUNTER — Ambulatory Visit (AMBULATORY_SURGERY_CENTER): Payer: 59 | Admitting: Internal Medicine

## 2017-11-21 VITALS — BP 118/62 | HR 55 | Temp 96.8°F | Resp 12 | Ht 62.0 in | Wt 157.0 lb

## 2017-11-21 DIAGNOSIS — Z86010 Personal history of colonic polyps: Secondary | ICD-10-CM | POA: Diagnosis present

## 2017-11-21 DIAGNOSIS — Z1211 Encounter for screening for malignant neoplasm of colon: Secondary | ICD-10-CM | POA: Diagnosis not present

## 2017-11-21 MED ORDER — LINACLOTIDE 72 MCG PO CAPS: 72.0000 ug | ORAL_CAPSULE | Freq: Every day | ORAL | 11 refills | Status: DC

## 2017-11-21 MED ORDER — SODIUM CHLORIDE 0.9 % IV SOLN
500.0000 mL | Freq: Once | INTRAVENOUS | Status: AC
Start: 2017-11-21 — End: ?

## 2017-11-21 MED FILL — LINZESS 72 MCG CAPSULE: 72 | 30 days supply | Qty: 30 | Fill #0

## 2017-11-21 NOTE — Patient Instructions (Signed)
YOU HAD AN ENDOSCOPIC PROCEDURE TODAY AT [facility]:   Refer to the procedure report that was given to you for any specific questions about what was found during the examination.  If the procedure report does not answer your questions, please call your gastroenterologist to clarify.  If you requested that your care partner not be given the details of your procedure findings, then the procedure report has been included in a sealed envelope for you to review at your convenience later.  YOU SHOULD EXPECT: Some feelings of bloating in the abdomen. Passage of more gas than usual.  Walking can help get rid of the air that was put into your GI tract during the procedure and reduce the bloating. If you had a lower endoscopy (such as a colonoscopy or flexible sigmoidoscopy) you may notice spotting of blood in your stool or on the toilet paper. If you underwent a bowel prep for your procedure, you may not have a normal bowel movement for a few days.  Please Note:  You might notice some irritation and congestion in your nose or some drainage.  This is from the oxygen used during your procedure.  There is no need for concern and it should clear up in a day or so.  SYMPTOMS TO REPORT IMMEDIATELY:   Following lower endoscopy (colonoscopy or flexible sigmoidoscopy):  Excessive amounts of blood in the stool  Significant tenderness or worsening of abdominal pains  Swelling of the abdomen that is new, acute  Fever of 100F or higher   For urgent or emergent issues, a gastroenterologist can be reached at any hour by calling [phone].   DIET:  We do recommend a small meal at first, but then you may proceed to your regular diet.  Drink plenty of fluids but you should avoid alcoholic beverages for 24 hours.  ACTIVITY:  You should plan to take it easy for the rest of today and you should NOT DRIVE or use heavy machinery until tomorrow (because of the sedation medicines used during the test).     FOLLOW UP: Our staff will call the number listed on your records the next business day following your procedure to check on you and address any questions or concerns that you may have regarding the information given to you following your procedure. If we do not reach you, we will leave a message.  However, if you are feeling well and you are not experiencing any problems, there is no need to return our call.  We will assume that you have returned to your regular daily activities without incident.  If any biopsies were taken you will be contacted by phone or by letter within the next 1-3 weeks.  Please call us at [phone] if you have not heard about the biopsies in 3 weeks.    SIGNATURES/CONFIDENTIALITY: You and/or your care partner have signed paperwork which will be entered into your electronic medical record.  These signatures attest to the fact that that the information above on your After Visit Summary has been reviewed and is understood.  Full responsibility of the confidentiality of this discharge information lies with you and/or your care-partner.  Diverticulosis information given.

## 2017-11-21 NOTE — Progress Notes (Signed)
No changes in medical or surgical hx since PV per pt 

## 2017-11-21 NOTE — Op Note (Signed)
Emily Calhoun Patient Name: Emily Calhoun Procedure Date: 11/21/2017 8:11 AM MRN: [id number] Endoscopist: Jerene Bears , MD Age: 62 Referring MD:  Date of Birth: January 05, 1956 Gender: Female Account #: [id number] Procedure:                Colonoscopy Indications:              Surveillance: Personal history of remote                            adenomatous polyps on last colonoscopy > 5 years                            ago (2013) Medicines:                Monitored Anesthesia Care Procedure:                Pre-Anesthesia Assessment:                           - Prior to the procedure, a History and Physical                            was performed, and patient medications and                            allergies were reviewed. The patient's tolerance of                            previous anesthesia was also reviewed. The risks                            and benefits of the procedure and the sedation                            options and risks were discussed with the patient.                            All questions were answered, and informed consent                            was obtained. Prior Anticoagulants: The patient has                            taken no previous anticoagulant or antiplatelet                            agents. ASA Grade Assessment: II - A patient with                            mild systemic disease. After reviewing the risks                            and benefits, the patient was deemed in  satisfactory condition to undergo the procedure.                           After obtaining informed consent, the colonoscope                            was passed under direct vision. Throughout the                            procedure, the patient's blood pressure, pulse, and                            oxygen saturations were monitored continuously. The                            Model PCF-H190DL [id number]) scope was introduced                 through the anus and advanced to the terminal                            ileum. The colonoscopy was performed without                            difficulty. The patient tolerated the procedure                            well. The quality of the bowel preparation was                            excellent. The terminal ileum, ileocecal valve,                            appendiceal orifice, and rectum were photographed. Scope In: 8:58:19 AM Scope Out: 9:10:05 AM Scope Withdrawal Time: 0 hours 8 minutes 12 seconds  Total Procedure Duration: 0 hours 11 minutes 46 seconds  Findings:                 The perianal and digital rectal examinations were                            normal.                           The terminal ileum appeared normal.                           A few small-mouthed diverticula were found in the                            sigmoid colon.                           The exam was otherwise without abnormality on                            direct and retroflexion  views. Complications:            No immediate complications. Estimated Blood Loss:     Estimated blood loss: none. Impression:               - The examined portion of the ileum was normal.                           - Diverticulosis in the sigmoid colon.                           - The examination was otherwise normal on direct                            and retroflexion views.                           - No specimens collected. Recommendation:           - Patient has a contact number available for                            emergencies. The signs and symptoms of potential                            delayed complications were discussed with the                            patient. Return to normal activities tomorrow.                            Written discharge instructions were provided to the                            patient.                           - Resume previous diet.                            - Continue present medications.                           - Repeat colonoscopy in 10 years for screening                            purposes. Jerene Bears, MD 11/21/2017 9:14:16 AM This report has been signed electronically.

## 2017-11-21 NOTE — Progress Notes (Signed)
A/ox3 pleased with MAC, report to RN 

## 2017-11-22 ENCOUNTER — Telehealth: Payer: Self-pay | Admitting: *Deleted

## 2017-11-22 NOTE — Telephone Encounter (Signed)
  Follow up Call-  Call back number 11/21/2017 10/07/2015  Post procedure Call Back phone  # [phone]  Permission to leave phone message Yes Yes  Some recent data might be hidden     Patient questions:  Do you have a fever, pain , or abdominal swelling? No. Pain Score  0 *  Have you tolerated food without any problems? Yes.    Have you been able to return to your normal activities? Yes.    Do you have any questions about your discharge instructions: Diet   No. Medications  No. Follow up visit  No.  Do you have questions or concerns about your Care? No.  Actions: * If pain score is 4 or above: No action needed, pain <4.

## 2017-12-10 MED FILL — ESOMEPRAZOLE MAG DR 40 MG C: 40 | 90 days supply | Qty: 90 | Fill #2

## 2017-12-10 MED FILL — ESTRADIOL 0.05 MG PATCH: 0.05 | 28 days supply | Qty: 8 | Fill #2

## 2017-12-10 MED FILL — LEVOTHYROXINE 75 MCG TABLET: 75 | 90 days supply | Qty: 90 | Fill #2

## 2017-12-13 MED FILL — clonazePAM 1 MG TABS: 1 | 30 days supply | Qty: 30 | Fill #0

## 2017-12-14 DIAGNOSIS — H02845 Edema of left lower eyelid: Secondary | ICD-10-CM | POA: Diagnosis not present

## 2017-12-14 DIAGNOSIS — H04123 Dry eye syndrome of bilateral lacrimal glands: Secondary | ICD-10-CM | POA: Diagnosis not present

## 2017-12-14 DIAGNOSIS — H04332 Acute lacrimal canaliculitis of left lacrimal passage: Secondary | ICD-10-CM | POA: Diagnosis not present

## 2017-12-25 DIAGNOSIS — L821 Other seborrheic keratosis: Secondary | ICD-10-CM | POA: Diagnosis not present

## 2017-12-25 DIAGNOSIS — Z411 Encounter for cosmetic surgery: Secondary | ICD-10-CM | POA: Diagnosis not present

## 2017-12-25 DIAGNOSIS — D1800 Hemangioma unspecified site: Secondary | ICD-10-CM | POA: Diagnosis not present

## 2017-12-25 DIAGNOSIS — F458 Other somatoform disorders: Secondary | ICD-10-CM | POA: Diagnosis not present

## 2017-12-25 DIAGNOSIS — Z85828 Personal history of other malignant neoplasm of skin: Secondary | ICD-10-CM | POA: Diagnosis not present

## 2017-12-25 DIAGNOSIS — D225 Melanocytic nevi of trunk: Secondary | ICD-10-CM | POA: Diagnosis not present

## 2017-12-25 DIAGNOSIS — L281 Prurigo nodularis: Secondary | ICD-10-CM | POA: Diagnosis not present

## 2017-12-25 DIAGNOSIS — D2261 Melanocytic nevi of right upper limb, including shoulder: Secondary | ICD-10-CM | POA: Diagnosis not present

## 2017-12-25 DIAGNOSIS — L814 Other melanin hyperpigmentation: Secondary | ICD-10-CM | POA: Diagnosis not present

## 2017-12-31 ENCOUNTER — Telehealth: Payer: Self-pay | Admitting: Interventional Cardiology

## 2017-12-31 ENCOUNTER — Ambulatory Visit (INDEPENDENT_AMBULATORY_CARE_PROVIDER_SITE_OTHER): Payer: 59 | Admitting: Interventional Cardiology

## 2017-12-31 ENCOUNTER — Encounter: Payer: Self-pay | Admitting: Interventional Cardiology

## 2017-12-31 VITALS — BP 112/60 | HR 75 | Ht 62.0 in | Wt 154.0 lb

## 2017-12-31 DIAGNOSIS — J310 Chronic rhinitis: Secondary | ICD-10-CM | POA: Diagnosis not present

## 2017-12-31 DIAGNOSIS — R55 Syncope and collapse: Secondary | ICD-10-CM

## 2017-12-31 DIAGNOSIS — I471 Supraventricular tachycardia, unspecified: Secondary | ICD-10-CM

## 2017-12-31 DIAGNOSIS — H04302 Unspecified dacryocystitis of left lacrimal passage: Secondary | ICD-10-CM | POA: Diagnosis not present

## 2017-12-31 DIAGNOSIS — R002 Palpitations: Secondary | ICD-10-CM

## 2017-12-31 DIAGNOSIS — H903 Sensorineural hearing loss, bilateral: Secondary | ICD-10-CM | POA: Diagnosis not present

## 2017-12-31 DIAGNOSIS — I447 Left bundle-branch block, unspecified: Secondary | ICD-10-CM | POA: Diagnosis not present

## 2017-12-31 MED ORDER — METOPROLOL TARTRATE 25 MG PO TABS: 25.0000 mg | ORAL_TABLET | Freq: Two times a day (BID) | ORAL | 2 refills | Status: DC

## 2017-12-31 NOTE — Telephone Encounter (Signed)
New Message:       Pt c/o Syncope: STAT if syncope occurred within 30 minutes and pt complains of lightheadedness High Priority if episode of passing out, completely, today or in last 24 hours   1. Did you pass out today? No  2. When is the last time you passed out? This weekend  3. Has this occurred multiple times? No  4. Did you have any symptoms prior to passing out? Pt states her blood rushed to her legs and then she passed out

## 2017-12-31 NOTE — Telephone Encounter (Signed)
Pt was at a horse show Saturday and had a syncopal episode.  She states she has done this about 15x before.  Usually occurs after exercise or if she gets too hot.  Was on the horse trailer and states she suddenly felt like someone "kicked me in the private parts", she doubled over, started sweating, mouth and hands went numb and couldn't think straight, then started vomiting and ultimately passed out.  Denies injury.  Cardiologist from [place] was on site.  Pt rested yesterday and has had no further issues.  Pt did state that she has an infection in her eye that was treated but has since returned.  Scheduled pt to come in today and see Dr. Tamala Julian.  Pt appreciative for call.

## 2017-12-31 NOTE — Patient Instructions (Addendum)
Medication Instructions:  Your physician recommends that you continue on your current medications as directed. Please refer to the Current Medication list given to you today.  Labwork: None  Testing/Procedures: None  Follow-Up: Continue with plan to follow up with Dr. Tamala Julian in January 2020.  Any Other Special Instructions Will Be Listed Below (If Applicable).     If you need a refill on your cardiac medications before your next appointment, please call your pharmacy.

## 2017-12-31 NOTE — Progress Notes (Signed)
Cardiology Office Note    Date:  12/31/2017   ID:  IOANNA COLQUHOUN, DOB Sep 15, 1955, MRN [id number]  PCP:  [name], MD  Cardiologist: Sinclair Grooms, MD   Chief Complaint  Patient presents with  . Loss of Consciousness    History of Present Illness:  Emily Calhoun is a 62 y.o. female with prior history of PSVT, fainting episodes felt to be related to vasovagal syncope, cervical and lumbar disc disease, and asthma.  This weekend, Emily Calhoun had an episode of syncope that was preceded by severe low back discomfort that cause pain to radiate into the perineal area, nausea, vomiting, and diaphoresis.  Low back discomfort and perineal pain started first and was followed by the subsequent complaints.  She has repeatedly had symptoms to this over the years dating back to adolescence.  She is an avid  equestrian and had sun exposure and prolonged riding prior to this episode that occurred after putting a horse in the stable.  Also associated with episode was urge to defecate.  No chest discomfort.  She has had previous similar episodes.  Past Medical History:  Diagnosis Date  . Anal fissure   . Anxiety   . Arthritis   . Asthma   . Asthma   . Autoimmune disease ([place])   . Cervical cancer [facility])    mom took DES  . Concussion    2 months- saw neurologist and everything was fine  . Depression   . GERD (gastroesophageal reflux disease)   . H/O transfusion of packed red blood cells   . Heart murmur   . Hepatitis A   . History of kidney stones   . Hyperlipidemia   . 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 IBS (irritable bowel syndrome)   . Migraine   . Migraine   . Seasonal allergies   . STD (sexually transmitted disease)    HSV II  . Sweet's disease   . Thyroid disease    hypothyroid    Past Surgical History:  Procedure Laterality Date  . ABDOMINAL HYSTERECTOMY  1988   ?retained ovaries for cx ca  . blephoplasty    . BREAST LUMPECTOMY Left 2009   benign; ?cyst  .  BREAST REDUCTION SURGERY Bilateral 09/2010  . COLONOSCOPY    . ELBOW SURGERY Right 1995   tendon repair  . ELBOW SURGERY Right   . EXCISION VAGINAL CYST  10-25-2009   benign squamous cyst  . FOOT SURGERY Right   . internal hemorrhoidectomy    . NASAL SINUS SURGERY     x 2  . TONSILLECTOMY    . UPPER GASTROINTESTINAL ENDOSCOPY      Current Medications: Outpatient Medications Prior to Visit  Medication Sig Dispense Refill  . acyclovir (ZOVIRAX) 400 MG tablet Take 1 tablet (400 mg total) by mouth daily. 90 tablet 4  . clonazePAM (KLONOPIN) 1 MG tablet Take 0.5 to 1 mg by mouth daily at bedtime as needed for sleep.    Marland Kitchen escitalopram (LEXAPRO) 20 MG tablet Take 20 mg by mouth daily.   3  . esomeprazole (NEXIUM) 40 MG capsule Take 1 capsule (40 mg total) by mouth daily. 30 capsule 3  . estradiol (VIVELLE-DOT) 0.05 MG/24HR patch Place 0.05 mg onto the skin daily.  6  . estradiol (VIVELLE-DOT) 0.075 MG/24HR PLACE 1 PATCH ONTO THE SKIN 2 TIMES A WEEK.    . gabapentin (NEURONTIN) 300 MG capsule Take 200 mg by mouth 3 (three)  times daily.   2  . levothyroxine (SYNTHROID, LEVOTHROID) 75 MCG tablet Take 75 mcg by mouth daily before breakfast.    . linaclotide (LINZESS) 72 MCG capsule Take 1 capsule (72 mcg total) by mouth daily. 30 capsule 11  . metoprolol tartrate (LOPRESSOR) 25 MG tablet TAKE 1 TABLET BY MOUTH TWICE DAILY 60 tablet 9  . moxifloxacin (VIGAMOX) 0.5 % ophthalmic solution Apply 3 mLs to eye daily as needed.    . Multiple Vitamins-Minerals (MULTIVITAMIN WITH MINERALS) tablet Take 1 tablet by mouth daily.     Marland Kitchen oxybutynin (DITROPAN-XL) 5 MG 24 hr tablet Take by mouth.    . traMADol (ULTRAM) 50 MG tablet Take 1 tablet (50 mg total) by mouth every 6 (six) hours as needed. 30 tablet 0   Facility-Administered Medications Prior to Visit  Medication Dose Route Frequency Provider Last Rate Last Dose  . 0.9 %  sodium chloride infusion  500 mL Intravenous Once Pyrtle, Lajuan Lines, MD          Allergies:   Azithromycin; Cephalexin; Cephalosporins; Lifitegrast; Metronidazole; Penicillins; Sulfonamide derivatives; Clarithromycin; Effexor [venlafaxine]; Gentamicin sulfate; Ketoconazole; Macrobid [nitrofurantoin]; Morphine sulfate; Singulair [montelukast sodium]; Tobramycin-dexamethasone; and Wellbutrin [bupropion]   Social History   Socioeconomic History  . Marital status: Married    Spouse name: Not on file  . Number of children: 2  . Years of education: Not on file  . Highest education level: Not on file  Occupational History  . Occupation: Merchandiser, retail: UNEMPLOYED  Social Needs  . Financial resource strain: Not on file  . Food insecurity:    Worry: Not on file    Inability: Not on file  . Transportation needs:    Medical: Not on file    Non-medical: Not on file  Tobacco Use  . Smoking status: Never Smoker  . Smokeless tobacco: Never Used  Substance and Sexual Activity  . Alcohol use: Yes    Alcohol/week: 4.8 oz    Types: 8 Standard drinks or equivalent per week    Comment: 2 glasses wine nightly.    . Drug use: No  . Sexual activity: Yes    Partners: Male    Birth control/protection: Surgical    Comment: TAH  Lifestyle  . Physical activity:    Days per week: Not on file    Minutes per session: Not on file  . Stress: Not on file  Relationships  . Social connections:    Talks on phone: Not on file    Gets together: Not on file    Attends religious service: Not on file    Active member of club or organization: Not on file    Attends meetings of clubs or organizations: Not on file    Relationship status: Not on file  Other Topics Concern  . Not on file  Social History Narrative   Lives with husband in a 2 story home.  Has 2 children.     Works as an Futures trader.    Education: college.     Family History:  The patient's family history includes Allergies in Emily Calhoun mother; Clotting disorder in Emily Calhoun father; Diabetes in Emily Calhoun father and sister;  Heart disease in Emily Calhoun father and mother; Other in Emily Calhoun son; Thyroid disease in Emily Calhoun father and sister.   ROS:   Please see the history of present illness.    Low back discomfort, difficulty with balance, recent fall from Emily Calhoun horse with medial collateral ligament tear and right elbow laceration,  dizziness, abdominal pain, shortness of breath, vision disturbance, difficulty with hearing, leg swelling, change in appetite, recent chills, sweating, and fatigue. All other systems reviewed and are negative.   PHYSICAL EXAM:   VS:  BP 112/60   Pulse 75   Ht 5\' 2"  (1.575 m)   Wt 154 lb (69.9 kg)   LMP 07/17/1986   SpO2 97%   BMI 28.17 kg/m    GEN: Well nourished, well developed, in no acute distress  HEENT: normal  Neck: no JVD, carotid bruits, or masses Cardiac: RRR; no murmurs, rubs, or gallops,no edema  Respiratory:  clear to auscultation bilaterally, normal work of breathing GI: soft, nontender, nondistended, + BS MS: no deformity or atrophy  Skin: warm and dry, no rash Neuro:  Alert and Oriented x 3, Strength and sensation are intact Psych: euthymic mood, full affect  Wt Readings from Last 3 Encounters:  12/31/17 154 lb (69.9 kg)  11/21/17 157 lb (71.2 kg)  11/07/17 157 lb (71.2 kg)      Studies/Labs Reviewed:   EKG:  EKG normal sinus rhythm with nonspecific T wave flattening.  Recent Labs: No results found for requested labs within last 8760 hours.   Lipid Panel No results found for: CHOL, TRIG, HDL, CHOLHDL, VLDL, LDLCALC, LDLDIRECT  Additional studies/ records that were reviewed today include:  None    ASSESSMENT:    1. Neurocardiogenic syncope   2. PSVT (paroxysmal supraventricular tachycardia) ([place])   3. LBBB (left bundle branch block)   4. Palpitations   5. Syncope, unspecified syncope type      PLAN:  In order of problems listed above:  1. Recurrent neurocardiogenic syncope.  We discussed the importance of Emily Calhoun prodrome which is low back and perineal  discomfort followed by nausea.  Described aversion tactics including lying flat, elevating the legs, and cold compresses to Emily Calhoun neck and head.  Pointedly explained that soon as prodrome starts she should immediately get into a protective position at a very minimum sitting but preferably lying flat with legs elevated. 2. No recurrence 3. No left bundle on today's EKG  Clinical follow-up as previously scheduled.  Aversion techniques for injury related to vasovagal syncope/neuro cardiogenic syncope.    Medication Adjustments/Labs and Tests Ordered: Current medicines are reviewed at length with the patient today.  Concerns regarding medicines are outlined above.  Medication changes, Labs and Tests ordered today are listed in the Patient Instructions below. Patient Instructions  Medication Instructions:  Your physician recommends that you continue on your current medications as directed. Please refer to the Current Medication list given to you today.  Labwork: None  Testing/Procedures: None  Follow-Up: Continue with plan to follow up with Dr. Tamala Julian in January 2020.  Any Other Special Instructions Will Be Listed Below (If Applicable).     If you need a refill on your cardiac medications before your next appointment, please call your pharmacy.      Signed, Sinclair Grooms, MD  12/31/2017 3:20 PM    [facility] [address], [place], [place]  [address] Phone: [phone]; Fax: [phone]

## 2018-01-01 DIAGNOSIS — H04422 Chronic lacrimal canaliculitis of left lacrimal passage: Secondary | ICD-10-CM | POA: Diagnosis not present

## 2018-01-01 MED FILL — levoFLOXacin 500 MG TABS: 500 | 10 days supply | Qty: 10 | Fill #0

## 2018-01-01 MED FILL — FLUTICASONE PROP 50 MCG SPR: 50 | 30 days supply | Qty: 16 | Fill #0

## 2018-01-02 DIAGNOSIS — H04222 Epiphora due to insufficient drainage, left lacrimal gland: Secondary | ICD-10-CM | POA: Diagnosis not present

## 2018-01-02 DIAGNOSIS — H04422 Chronic lacrimal canaliculitis of left lacrimal passage: Secondary | ICD-10-CM | POA: Diagnosis not present

## 2018-01-03 MED FILL — TRETINOIN 0.025% CREAM: 0.025 | 30 days supply | Qty: 20 | Fill #0

## 2018-01-07 DIAGNOSIS — R350 Frequency of micturition: Secondary | ICD-10-CM | POA: Diagnosis not present

## 2018-01-07 DIAGNOSIS — Z01818 Encounter for other preprocedural examination: Secondary | ICD-10-CM | POA: Diagnosis not present

## 2018-01-07 DIAGNOSIS — E039 Hypothyroidism, unspecified: Secondary | ICD-10-CM | POA: Diagnosis not present

## 2018-01-07 MED FILL — METOPROLOL TARTRATE 25 MG T: 25 | 90 days supply | Qty: 180 | Fill #2

## 2018-01-09 MED FILL — ESTRADIOL 0.05 MG PATCH: 0.05 | 84 days supply | Qty: 24 | Fill #0

## 2018-01-10 DIAGNOSIS — H04552 Acquired stenosis of left nasolacrimal duct: Secondary | ICD-10-CM | POA: Diagnosis not present

## 2018-01-10 DIAGNOSIS — J45909 Unspecified asthma, uncomplicated: Secondary | ICD-10-CM | POA: Diagnosis not present

## 2018-01-10 DIAGNOSIS — E039 Hypothyroidism, unspecified: Secondary | ICD-10-CM | POA: Diagnosis not present

## 2018-01-10 DIAGNOSIS — F419 Anxiety disorder, unspecified: Secondary | ICD-10-CM | POA: Diagnosis not present

## 2018-01-10 DIAGNOSIS — Z888 Allergy status to other drugs, medicaments and biological substances status: Secondary | ICD-10-CM | POA: Diagnosis not present

## 2018-01-10 DIAGNOSIS — Z885 Allergy status to narcotic agent status: Secondary | ICD-10-CM | POA: Diagnosis not present

## 2018-01-10 DIAGNOSIS — H04422 Chronic lacrimal canaliculitis of left lacrimal passage: Secondary | ICD-10-CM | POA: Diagnosis not present

## 2018-01-10 DIAGNOSIS — Z881 Allergy status to other antibiotic agents status: Secondary | ICD-10-CM | POA: Diagnosis not present

## 2018-01-10 DIAGNOSIS — F329 Major depressive disorder, single episode, unspecified: Secondary | ICD-10-CM | POA: Diagnosis not present

## 2018-01-10 MED FILL — NEO/POLYMYXIN/DEXAMETH DROP: 3.5-10000-0 | 25 days supply | Qty: 5 | Fill #0

## 2018-01-10 MED FILL — HYDROCODON-APAP 5-325: 5-325 | 3 days supply | Qty: 20 | Fill #0

## 2018-01-10 MED FILL — DOXYCYCLINE HYCLATE 100 MG: 100 | 14 days supply | Qty: 28 | Fill #0

## 2018-01-21 DIAGNOSIS — H04552 Acquired stenosis of left nasolacrimal duct: Secondary | ICD-10-CM | POA: Diagnosis not present

## 2018-02-04 MED FILL — ACYCLOVIR 400 MG TABLET: 400 | 90 days supply | Qty: 90 | Fill #2

## 2018-02-04 MED FILL — ESCITALOPRAM 20 MG TABLET: 20 | 90 days supply | Qty: 90 | Fill #2

## 2018-02-05 MED FILL — clonazePAM 1 MG TABS: 1 | 30 days supply | Qty: 30 | Fill #0

## 2018-03-04 MED FILL — LEVOTHYROXINE 75 MCG TABLET: 75 | 90 days supply | Qty: 90 | Fill #3

## 2018-03-04 MED FILL — ESOMEPRAZOLE MAG DR 40 MG C: 40 | 90 days supply | Qty: 90 | Fill #3

## 2018-03-27 DIAGNOSIS — H04552 Acquired stenosis of left nasolacrimal duct: Secondary | ICD-10-CM | POA: Diagnosis not present

## 2018-03-27 MED FILL — GABAPENTIN 300 MG CAPSULE: 300 | 90 days supply | Qty: 270 | Fill #1

## 2018-03-27 MED FILL — ESTRADIOL 0.05 MG PATCH: 0.05 | 84 days supply | Qty: 24 | Fill #1

## 2018-04-22 MED FILL — ESCITALOPRAM 20 MG TABLET: 20 | 90 days supply | Qty: 90 | Fill #3

## 2018-04-22 MED FILL — METOPROLOL TARTRATE 25 MG T: 25 | 90 days supply | Qty: 180 | Fill #3

## 2018-05-31 DIAGNOSIS — G4700 Insomnia, unspecified: Secondary | ICD-10-CM | POA: Diagnosis not present

## 2018-05-31 DIAGNOSIS — E785 Hyperlipidemia, unspecified: Secondary | ICD-10-CM | POA: Diagnosis not present

## 2018-05-31 DIAGNOSIS — F329 Major depressive disorder, single episode, unspecified: Secondary | ICD-10-CM | POA: Diagnosis not present

## 2018-05-31 DIAGNOSIS — Z23 Encounter for immunization: Secondary | ICD-10-CM | POA: Diagnosis not present

## 2018-05-31 DIAGNOSIS — I471 Supraventricular tachycardia: Secondary | ICD-10-CM | POA: Diagnosis not present

## 2018-05-31 DIAGNOSIS — E039 Hypothyroidism, unspecified: Secondary | ICD-10-CM | POA: Diagnosis not present

## 2018-05-31 DIAGNOSIS — R202 Paresthesia of skin: Secondary | ICD-10-CM | POA: Diagnosis not present

## 2018-05-31 DIAGNOSIS — R21 Rash and other nonspecific skin eruption: Secondary | ICD-10-CM | POA: Diagnosis not present

## 2018-05-31 DIAGNOSIS — K219 Gastro-esophageal reflux disease without esophagitis: Secondary | ICD-10-CM | POA: Diagnosis not present

## 2018-05-31 MED FILL — LEVOTHYROXINE 75 MCG TABLET: 75 | 90 days supply | Qty: 90 | Fill #0

## 2018-05-31 MED FILL — clonazePAM 1 MG TABS: 1 | 30 days supply | Qty: 30 | Fill #0

## 2018-05-31 MED FILL — ESOMEPRAZOLE MAG DR 40 MG C: 40 | 90 days supply | Qty: 90 | Fill #0

## 2018-06-03 MED FILL — ACYCLOVIR 400 MG TABLET: 400 | 90 days supply | Qty: 90 | Fill #3

## 2018-07-01 DIAGNOSIS — E039 Hypothyroidism, unspecified: Secondary | ICD-10-CM | POA: Diagnosis not present

## 2018-07-03 MED FILL — CLOBETASOL 0.05% TOPICAL LO: 0.05 | 30 days supply | Qty: 59 | Fill #0

## 2018-07-16 MED FILL — ESCITALOPRAM 20 MG TABLET: 20 | 90 days supply | Qty: 90 | Fill #0

## 2018-07-16 MED FILL — METOPROLOL TARTRATE 25 MG T: 25 | 90 days supply | Qty: 180 | Fill #0

## 2018-07-17 ENCOUNTER — Other Ambulatory Visit: Payer: Self-pay

## 2018-07-17 ENCOUNTER — Emergency Department (HOSPITAL_COMMUNITY): Payer: 59

## 2018-07-17 ENCOUNTER — Encounter (HOSPITAL_COMMUNITY): Payer: Self-pay

## 2018-07-17 ENCOUNTER — Emergency Department (HOSPITAL_COMMUNITY)
Admission: EM | Admit: 2018-07-17 | Discharge: 2018-07-17 | Disposition: A | Discharge status: Home / Self Care | Payer: 59 | Attending: Emergency Medicine | Admitting: Emergency Medicine

## 2018-07-17 ENCOUNTER — Telehealth: Payer: Self-pay | Admitting: Physician Assistant

## 2018-07-17 DIAGNOSIS — E039 Hypothyroidism, unspecified: Secondary | ICD-10-CM | POA: Insufficient documentation

## 2018-07-17 DIAGNOSIS — R109 Unspecified abdominal pain: Secondary | ICD-10-CM | POA: Diagnosis not present

## 2018-07-17 DIAGNOSIS — Z79899 Other long term (current) drug therapy: Secondary | ICD-10-CM | POA: Diagnosis not present

## 2018-07-17 DIAGNOSIS — J45909 Unspecified asthma, uncomplicated: Secondary | ICD-10-CM | POA: Diagnosis not present

## 2018-07-17 DIAGNOSIS — D591 Other autoimmune hemolytic anemias: Secondary | ICD-10-CM | POA: Diagnosis not present

## 2018-07-17 DIAGNOSIS — K625 Hemorrhage of anus and rectum: Secondary | ICD-10-CM | POA: Insufficient documentation

## 2018-07-17 DIAGNOSIS — N200 Calculus of kidney: Secondary | ICD-10-CM | POA: Diagnosis not present

## 2018-07-17 DIAGNOSIS — R1084 Generalized abdominal pain: Secondary | ICD-10-CM | POA: Diagnosis not present

## 2018-07-17 LAB — PROTIME-INR
INR: 0.82
Prothrombin Time: 11.2 seconds — ABNORMAL LOW (ref 11.4–15.2)

## 2018-07-17 LAB — TYPE AND SCREEN
ABO/RH(D): O POS
Antibody Screen: NEGATIVE

## 2018-07-17 LAB — CBC WITH DIFFERENTIAL/PLATELET
Abs Immature Granulocytes: 0.02 10*3/uL (ref 0.00–0.07)
Basophils Absolute: 0.1 10*3/uL (ref 0.0–0.1)
Basophils Relative: 1 %
Eosinophils Absolute: 0.2 10*3/uL (ref 0.0–0.5)
Eosinophils Relative: 3 %
HCT: 46.3 % — ABNORMAL HIGH (ref 36.0–46.0)
Hemoglobin: 15.1 g/dL — ABNORMAL HIGH (ref 12.0–15.0)
Immature Granulocytes: 0 %
Lymphocytes Relative: 17 %
Lymphs Abs: 1.2 10*3/uL (ref 0.7–4.0)
MCH: 33 pg (ref 26.0–34.0)
MCHC: 32.6 g/dL (ref 30.0–36.0)
MCV: 101.1 fL — ABNORMAL HIGH (ref 80.0–100.0)
MONO ABS: 0.6 10*3/uL (ref 0.1–1.0)
Monocytes Relative: 9 %
Neutro Abs: 5.1 10*3/uL (ref 1.7–7.7)
Neutrophils Relative %: 70 %
Platelets: 251 10*3/uL (ref 150–400)
RBC: 4.58 MIL/uL (ref 3.87–5.11)
RDW: 12.9 % (ref 11.5–15.5)
WBC: 7.2 10*3/uL (ref 4.0–10.5)
nRBC: 0 % (ref 0.0–0.2)

## 2018-07-17 LAB — COMPREHENSIVE METABOLIC PANEL
ALK PHOS: 91 U/L (ref 38–126)
ALT: 26 U/L (ref 0–44)
AST: 25 U/L (ref 15–41)
Albumin: 4.5 g/dL (ref 3.5–5.0)
Anion gap: 9 (ref 5–15)
BILIRUBIN TOTAL: 0.7 mg/dL (ref 0.3–1.2)
BUN: 19 mg/dL (ref 8–23)
CALCIUM: 9.6 mg/dL (ref 8.9–10.3)
CO2: 29 mmol/L (ref 22–32)
Chloride: 102 mmol/L (ref 98–111)
Creatinine, Ser: 0.8 mg/dL (ref 0.44–1.00)
GFR calc Af Amer: >60 mL/min (ref >60)
GFR calc non Af Amer: >60 mL/min (ref >60)
Glucose, Bld: 98 mg/dL (ref 70–99)
Potassium: 4.4 mmol/L (ref 3.5–5.1)
Sodium: 140 mmol/L (ref 135–145)
TOTAL PROTEIN: 7.4 g/dL (ref 6.5–8.1)

## 2018-07-17 LAB — POC OCCULT BLOOD, ED: Fecal Occult Bld: POSITIVE — AB

## 2018-07-17 LAB — ABO/RH: ABO/RH(D): O POS

## 2018-07-17 MED ORDER — DICYCLOMINE HCL 20 MG PO TABS: 20.0000 mg | ORAL_TABLET | Freq: Two times a day (BID) | ORAL | 0 refills | Status: AC

## 2018-07-17 MED ORDER — FENTANYL CITRATE (PF) 100 MCG/2ML IJ SOLN
50.0000 ug | Freq: Once | INTRAMUSCULAR | Status: AC
  Administered 2018-07-17: 50 ug via INTRAVENOUS
  Filled 2018-07-17: qty 2

## 2018-07-17 MED ORDER — SODIUM CHLORIDE (PF) 0.9 % IJ SOLN
INTRAMUSCULAR | Status: AC
  Filled 2018-07-17: qty 50

## 2018-07-17 MED ORDER — HYDROMORPHONE HCL 1 MG/ML IJ SOLN
0.5000 mg | Freq: Once | INTRAMUSCULAR | Status: AC
  Administered 2018-07-17: 0.5 mg via INTRAVENOUS
  Filled 2018-07-17: qty 1

## 2018-07-17 MED ORDER — IOPAMIDOL (ISOVUE-300) INJECTION 61%
INTRAVENOUS | Status: AC
  Filled 2018-07-17: qty 100

## 2018-07-17 NOTE — Discharge Instructions (Addendum)
You were evaluated today for rectal bleeding. Your CT scan was negative. Please follow up with GI over the next 1-2 days. I have given you bentyl for your abdominal cramping. Please take as prescribed.  Return to the ED with any new or worsening symptoms.

## 2018-07-17 NOTE — Telephone Encounter (Signed)
9:32 07/17/18  Patient call to on call service:  Eating dinner, sharp abdominal pain- went to bed- 11pm woke up nauseous, BM took a while and didn't feel better, then 1am started bleeding "quarter sized clots"- continued until this morning, now just clots of blood- yesterday ate quarter of lobster tail and moved several 50lb bales of hay, total of 5-6 bloody clots, last about 9am, abdominal pain is getting some better- don't feel like eating.   Told the patient to proceed to the ER for further eval. She says she is in [facility] and it will take them at least an hour to get back to [place] or [place]. She is not sure which they will go to.  Explained that this note would be in the chart and if they need to admit her they can consult our service.  Ellouise Newer, [name] [facility]

## 2018-07-17 NOTE — ED Provider Notes (Signed)
Patient care transferred at shift change from [name], [name].  Please see note for detailed HPI.  In summation, 63 year old female past medical history significant for hyperlipidemia, GERD, depression, anal fissure who presents for evaluation complaining of rectal bleeding.  Patient states this is been intermittent over the last 2 to 3 weeks.  Patient states she has noticed some bright red blood while wiping after a bowel movement.  Patient states yesterday evening she noticed onset of lower abdominal cramping and shortly passed several blood clots. Patient states she is followed by GI, Dr. Hilarie Calhoun.  Patient states she did have a colonoscopy approximately 6 months ago which did not show any acute findings at that time per patient.  Patient states she did have an episode of dizziness 2 days ago.  Denies fever, chills, emesis, chest pain, shortness of breath, dysuria, history of hemorrhoids.  Denies use of anticoagulation.  Labs reassuring with hemoglobin 15.1, no leukocytosis, metabolic panel without any electrolyte, renal or liver abnormalities.  Occult blood positive.  At Care transfer patient was pending CT scan.  [name], [name] has spoken with [facility] prior to my assumption of care.  If CT scan negative, able to ambulate without difficulty and vital signs stable patient can DC home with close follow-up with GI.  If CT abnormal patient will need admission to medicine service with consult with GI.  Physical Exam  BP 128/82   Pulse 70   Temp 98.3 F (36.8 C) (Oral)   Resp 17   LMP 07/17/1986   SpO2 90%   Physical Exam Vitals signs and nursing note reviewed.  Constitutional:      General: She is not in acute distress.    Appearance: She is well-developed. She is not ill-appearing, toxic-appearing or diaphoretic.     Comments: Patient sitting in bed drinking water on initial evaluation.  She does not appear in any acute distress.  HENT:     Head: Normocephalic and atraumatic.     Nose: Nose  normal.     Mouth/Throat:     Mouth: Mucous membranes are moist.  Eyes:     Pupils: Pupils are equal, round, and reactive to light.  Neck:     Musculoskeletal: Normal range of motion.  Cardiovascular:     Rate and Rhythm: Normal rate.     Pulses: Normal pulses.     Heart sounds: Normal heart sounds. No murmur. No friction rub. No gallop.   Pulmonary:     Effort: Pulmonary effort is normal. No respiratory distress.     Breath sounds: Normal breath sounds. No stridor. No wheezing, rhonchi or rales.  Chest:     Chest wall: No tenderness.  Abdominal:     General: Bowel sounds are normal. There is no distension.     Palpations: Abdomen is soft. There is no mass.     Tenderness: There is no abdominal tenderness. There is no right CVA tenderness, left CVA tenderness, guarding or rebound.     Hernia: No hernia is present.     Comments: Soft, nontender without rebound or guarding.  Genitourinary:    Comments: Rectal exam performed by previous provider.  See her note. Musculoskeletal: Normal range of motion.     Comments: Moves all extremities without difficulty.  Skin:    General: Skin is warm and dry.  Neurological:     Mental Status: She is alert.     Comments: Patient ambulates in hall without difficulty.     ED Course/Procedures  Procedures Ct Abdomen Pelvis Wo Contrast  Result Date: 07/17/2018 CLINICAL DATA:  Pelvic pain EXAM: CT ABDOMEN AND PELVIS WITHOUT CONTRAST TECHNIQUE: Multidetector CT imaging of the abdomen and pelvis was performed following the standard protocol without IV contrast. COMPARISON:  09/23/2012 CT FINDINGS: Lower chest: No acute consolidation or effusion. Mild atelectasis or scar at the right middle lobe. Borderline heart size. Hepatobiliary: No focal liver abnormality is seen. No gallstones, gallbladder wall thickening, or biliary dilatation. Pancreas: Unremarkable. No pancreatic ductal dilatation or surrounding inflammatory changes. Spleen: Normal in size  without focal abnormality. Adrenals/Urinary Tract: Adrenal glands are normal. Prominent right extrarenal pelvis without calyceal dilatation. Punctate stones in the lower pole of the right kidney. Bladder normal Stomach/Bowel: Stomach is within normal limits. Appendix appears normal. No evidence of bowel wall thickening, distention, or inflammatory changes. Vascular/Lymphatic: No significant vascular findings are present. No enlarged abdominal or pelvic lymph nodes. Reproductive: Status post hysterectomy. No adnexal masses. Other: No abdominal wall hernia or abnormality. No abdominopelvic ascites. Musculoskeletal: No acute or significant osseous findings. IMPRESSION: 1. No CT evidence for acute intra-abdominal or pelvic abnormality. 2. Nonobstructing stones in the right kidney Electronically Signed   By: Donavan Foil M.D.   On: 07/17/2018 17:20   Labs Reviewed  CBC WITH DIFFERENTIAL/PLATELET - Abnormal; Notable for the following components:      Result Value   Hemoglobin 15.1 (*)    HCT 46.3 (*)    MCV 101.1 (*)    All other components within normal limits  PROTIME-INR - Abnormal; Notable for the following components:   Prothrombin Time 11.2 (*)    All other components within normal limits  POC OCCULT BLOOD, ED - Abnormal; Notable for the following components:   Fecal Occult Bld POSITIVE (*)    All other components within normal limits  COMPREHENSIVE METABOLIC PANEL  TYPE AND SCREEN  ABO/RH   MDM  Patient care transferred from [name], [name] at shift change.  63 year old female who appears otherwise well presents for evaluation of intermittent rectal bleeding over the last 3 weeks.  She describes as a spotting.  Last night she developed abdominal cramping and passed several quarter size clots.  Patient is followed by [facility], Dr. Hilarie [facility].  Patient did receive colonoscopy within the last year which was normal, per patient.  In previous providers rectal exam there was bright red blood per  rectum-Hemoccult positive, Patient did have external hemorrhoids on exam.  Hemoglobin 15.1, CBC without leukocytosis, metabolic panel without any electrolyte, renal or liver abnormalities.   At care transfer patient pending CT scan.  [name], [name] has consulted with patient's GI providers.  Dr. Bryan Lemma, [facility] who recommends if CT scan negative, hemodynamically stable patient can DC home with close outpatient follow-up.  If any abnormalities on CT scan GI recommends medicine to admit with GI consult.  On my initial evaluation abdomen soft, nontender without rebound or guarding.  Patient able to tolerate p.o. intake in department that difficulty.  Patient states she has been up to use restroom multiple times and has not had any bright red blood per rectum.  No lightheaded or dizziness with ambulation.  CT scan negative for acute abdomino-pelvic pathology.  I discussed CT findings with patient and husband.  Patient would like to try outpatient management and DC home with close GI follow-up.  Will have patient ambulate with nursing and reevaluate.  Patient was able to ambulate without difficulty in department. No lightheaded or dizziness, hypoxia with ambulation. Patient is hemodynamically stable  and appropriate for DC home at this time.  I have discussed close outpatient follow-up with her GI provider.  I have discussed strict return precautions.  Patient voiced understanding and is agreeable for follow-up.   Benjie Ricketson A, [name] 07/17/18 2335    Pattricia Boss, MD 07/17/18 2348

## 2018-07-17 NOTE — ED Notes (Signed)
POC Occult blood stool was positive, Roselyn Reef PA made aware of results

## 2018-07-17 NOTE — ED Notes (Signed)
Patient transported to CT 

## 2018-07-17 NOTE — ED Triage Notes (Signed)
She c/o painless rectal bleeding. She states that she has had some leakage of "pinkish blood" from rectum for past few days. Today she c/o a "blob of red bleeding" from her rectum. She is in no distress. She cites hx of hemorrhoid surgery "years ago".

## 2018-07-17 NOTE — ED Notes (Signed)
Bed: WA08 Expected date:  Expected time:  Means of arrival:  Comments: EMS/Bleeding

## 2018-07-17 NOTE — ED Provider Notes (Signed)
[facility] DEPT Provider Note   CSN: [id number] Arrival date & time: 07/17/18  1116     History   Chief Complaint Chief Complaint  Patient presents with  . Rectal Bleeding    HPI Emily [facility] is a 63 y.o. female.  The history is provided by the patient and medical records. No language interpreter was used.  Rectal Bleeding  Associated symptoms: abdominal pain    Emily Calhoun is a 63 y.o. female  with a PMH as listed below who presents to the Emergency Department complaining of rectal bleeding.  Patient states that over the last 2 to 3 weeks, she has intermittently noticed some bright red blood when wiping or on her pad.  Last night, she developed acute onset of lower abdominal cramping and shortly thereafter, passed several blood clots.  She states that these were not in association with bowel movement.  About quarter size.  This is never happened to her before.  She is followed by GI, [name], and had a colonoscopy within the last year.  At that time, she was told the colonoscopy was "clean as whistle".  She did feel nauseous and as if she was going to have a syncopal episode, however did not faint.  She no longer feels nauseous.  Feels little a little weak.  Past Medical History:  Diagnosis Date  . Anal fissure   . Anxiety   . Arthritis   . Asthma   . Asthma   . Autoimmune disease ([place])   . Cervical cancer [facility])    mom took DES  . Concussion    2 months- saw neurologist and everything was fine  . Depression   . GERD (gastroesophageal reflux disease)   . H/O transfusion of packed red blood cells   . Heart murmur   . Hepatitis A   . History of kidney stones   . Hyperlipidemia   . 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 IBS (irritable bowel syndrome)   . Migraine   . Migraine   . Seasonal allergies   . STD (sexually transmitted disease)    HSV II  . Sweet's disease   . Thyroid disease    hypothyroid    Patient Active  Problem List   Diagnosis Date Noted  . Pain in right elbow 09/03/2017  . Acute pain of right shoulder 09/03/2017  . PSVT (paroxysmal supraventricular tachycardia) ([place]) 02/22/2017  . Dyspnea 01/25/2017  . Chronic fatigue 07/24/2013  . Acute pain of left knee 07/24/2013  . Depression 03/12/2013  . Insomnia 03/12/2013  . Rash 02/21/2011  . ALLERGIC RHINITIS 08/18/2010  . CERVICAL CANCER, HX OF 11/02/2009  . CERVICAL CANCER 10/26/2009  . HYPOTHYROIDISM 10/26/2009  . 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 SINUSITIS, RECURRENT 10/26/2009  . Asthma 10/26/2009  . LOW BACK PAIN, CHRONIC 10/26/2009  . HEMORRHOIDS, HX OF 10/26/2009  . PERSONAL HISTORY OF URINARY CALCULI 10/26/2009    Past Surgical History:  Procedure Laterality Date  . ABDOMINAL HYSTERECTOMY  1988   ?retained ovaries for cx ca  . blephoplasty    . BREAST LUMPECTOMY Left 2009   benign; ?cyst  . BREAST REDUCTION SURGERY Bilateral 09/2010  . COLONOSCOPY    . ELBOW SURGERY Right 1995   tendon repair  . ELBOW SURGERY Right   . EXCISION VAGINAL CYST  10-25-2009   benign squamous cyst  . FOOT SURGERY Right   . internal hemorrhoidectomy    . NASAL SINUS SURGERY  x 2  . TONSILLECTOMY    . UPPER GASTROINTESTINAL ENDOSCOPY       OB History    Gravida  3   Para  2   Term  2   Preterm  0   AB  1   Living  2     SAB  1   TAB  0   Ectopic  0   Multiple  0   Live Births  2            Home Medications    Prior to Admission medications   Medication Sig Start Date End Date Taking? Authorizing Provider  acyclovir (ZOVIRAX) 400 MG tablet Take 1 tablet (400 mg total) by mouth daily. 05/03/15  Yes Megan Salon, MD  clonazePAM (KLONOPIN) 1 MG tablet Take 0.5 to 1 mg by mouth daily at bedtime as needed for sleep.   Yes [provider]  escitalopram (LEXAPRO) 20 MG tablet Take 20 mg by mouth daily.  04/19/15  Yes [provider]  esomeprazole (NEXIUM) 40 MG capsule Take 1 capsule (40 mg  total) by mouth daily. 09/27/15  Yes Pyrtle, Lajuan Lines, MD  estradiol (VIVELLE-DOT) 0.075 MG/24HR Place 1 patch onto the skin 2 (two) times a week.  05/30/16  Yes [provider]  fluticasone (FLONASE) 50 MCG/ACT nasal spray Place 2 sprays into both nostrils daily. 01/01/18  Yes [provider]  gabapentin (NEURONTIN) 300 MG capsule Take 200 mg by mouth 3 (three) times daily.  02/20/17  Yes [provider]  levothyroxine (SYNTHROID, LEVOTHROID) 75 MCG tablet Take 75 mcg by mouth daily before breakfast.   Yes [provider]  linaclotide (LINZESS) 72 MCG capsule Take 1 capsule (72 mcg total) by mouth daily. 11/21/17  Yes Pyrtle, Lajuan Lines, MD  metoprolol tartrate (LOPRESSOR) 25 MG tablet Take 1 tablet (25 mg total) by mouth 2 (two) times daily. Patient taking differently: Take 50 mg by mouth daily.  12/31/17  Yes Belva Crome, MD  estradiol (VIVELLE-DOT) 0.05 MG/24HR patch Place 0.05 mg onto the skin daily. 12/10/17   [provider]  traMADol (ULTRAM) 50 MG tablet Take 1 tablet (50 mg total) by mouth every 6 (six) hours as needed. Patient not taking: Reported on 07/17/2018 09/03/17   Aundra Dubin, [name]    Family History Family History  Problem Relation Age of Onset  . Allergies Mother   . Heart disease Mother   . Heart disease Father   . Clotting disorder Father        per pt had clot in is intestines  . Diabetes Father   . Thyroid disease Father   . Diabetes Sister   . Thyroid disease Sister   . Other Son        Hypogammaglobulinemia  . Colon cancer Neg Hx   . Esophageal cancer Neg Hx   . Stomach cancer Neg Hx   . Pancreatic cancer Neg Hx   . Liver disease Neg Hx     Social History Social History   Tobacco Use  . Smoking status: Never Smoker  . Smokeless tobacco: Never Used  Substance Use Topics  . Alcohol use: Yes    Alcohol/week: 8.0 standard drinks    Types: 8 Standard drinks or equivalent per week    Comment: 2 glasses wine nightly.      . Drug use: No     Allergies   Azithromycin; Cephalexin; Cephalosporins; Ketamine; Lifitegrast; Metronidazole; Minocin [minocycline hcl]; Other; Penicillins; Sulfa  antibiotics; Sulfonamide derivatives; Clarithromycin; Effexor [venlafaxine]; Gentamicin sulfate; Iodinated diagnostic agents; Ketoconazole; Macrobid [nitrofurantoin]; Morphine sulfate; Singulair [montelukast sodium]; Tobramycin-dexamethasone; and Wellbutrin [bupropion]   Review of Systems Review of Systems  Gastrointestinal: Positive for abdominal pain, anal bleeding, hematochezia and nausea.  Neurological: Positive for weakness.  All other systems reviewed and are negative.    Physical Exam Updated Vital Signs BP 126/82   Pulse 64   Temp 98.3 F (36.8 C) (Oral)   Resp 16   LMP 07/17/1986   SpO2 95%   Physical Exam Vitals signs and nursing note reviewed.  Constitutional:      General: She is not in acute distress.    Appearance: She is well-developed.  HENT:     Head: Normocephalic and atraumatic.  Cardiovascular:     Rate and Rhythm: Normal rate and regular rhythm.     Heart sounds: Normal heart sounds. No murmur.  Pulmonary:     Effort: Pulmonary effort is normal. No respiratory distress.     Breath sounds: Normal breath sounds.  Abdominal:     General: There is no distension.     Palpations: Abdomen is soft.     Comments: Mild lower abdominal tenderness.  Genitourinary:    Comments: External hemorrhoids noted. No overt rectal bleeding noted. Bright red blood on rectal exam. Skin:    General: Skin is warm and dry.  Neurological:     Mental Status: She is alert and oriented to person, place, and time.      ED Treatments / Results  Labs (all labs ordered are listed, but only abnormal results are displayed) Labs Reviewed  CBC WITH DIFFERENTIAL/PLATELET - Abnormal; Notable for the following components:      Result Value   Hemoglobin 15.1 (*)    HCT 46.3 (*)    MCV 101.1 (*)    All other  components within normal limits  PROTIME-INR - Abnormal; Notable for the following components:   Prothrombin Time 11.2 (*)    All other components within normal limits  POC OCCULT BLOOD, ED - Abnormal; Notable for the following components:   Fecal Occult Bld POSITIVE (*)    All other components within normal limits  COMPREHENSIVE METABOLIC PANEL  OCCULT BLOOD X 1 CARD TO LAB, STOOL  TYPE AND SCREEN  ABO/RH    EKG None  Radiology No results found.  Procedures Procedures (including critical care time)  Medications Ordered in ED Medications  iopamidol (ISOVUE-300) 61 % injection (has no administration in time range)  sodium chloride (PF) 0.9 % injection (has no administration in time range)  fentaNYL (SUBLIMAZE) injection 50 mcg (50 mcg Intravenous Given 07/17/18 1419)  HYDROmorphone (DILAUDID) injection 0.5 mg (0.5 mg Intravenous Given 07/17/18 1604)     Initial Impression / Assessment and Plan / ED Course  I have reviewed the triage vital signs and the nursing notes.  Pertinent labs & imaging results that were available during my care of the patient were reviewed by me and considered in my medical decision making (see chart for details).    [facility] is a 63 y.o. female who presents to ED for intermittent rectal bleeding over the last few weeks which she describes as spotting. Last night, she developed abdominal cramping-like pain and past several quarter size clots.  She felt as if she was going to faint, but did not have a full syncopal episode.  She is followed by [facility], [name], and had colonoscopy last year which reportedly was normal per  patient.  On exam, she does have tenderness across the lower abdomen.  Bright red blood on rectal exam-Hemoccult positive.  Labs reviewed and reassuring.  Hemoglobin is actually 15.1. Discussed case with GI - if ct negative, still has normal vitals and ambulatory, can likely be discharged home with close GI follow up if she is comfortable  with this plan, but definitely reasonable to admit if she is not comfortable with this plan. If any abnormalities to CT, recommends admission.   CT abd/pelvis pending at shift change. Care assumed by oncoming provider PA [place]. Case discussed, plan agreed upon. Will follow up on pending CT abd/pelvis and re-evaluate.   Patient seen by and discussed with Dr. Lacinda Axon who agrees with treatment plan.    Final Clinical Impressions(s) / ED Diagnoses   Final diagnoses:  None    ED Discharge Orders    None       [name], Ozella Almond, [name] 07/17/18 1631    Nat Christen, MD 07/18/18 [phone]

## 2018-07-18 ENCOUNTER — Encounter: Payer: Self-pay | Admitting: Physician Assistant

## 2018-07-18 ENCOUNTER — Ambulatory Visit (INDEPENDENT_AMBULATORY_CARE_PROVIDER_SITE_OTHER): Payer: 59 | Admitting: Physician Assistant

## 2018-07-18 VITALS — BP 112/78 | HR 68 | Ht 62.0 in | Wt 153.0 lb

## 2018-07-18 DIAGNOSIS — K55039 Acute (reversible) ischemia of large intestine, extent unspecified: Secondary | ICD-10-CM | POA: Diagnosis not present

## 2018-07-18 DIAGNOSIS — N200 Calculus of kidney: Secondary | ICD-10-CM | POA: Diagnosis not present

## 2018-07-18 NOTE — Patient Instructions (Addendum)
Take Bentyl 10 mg, ( dicyclomine) by mouth every 6 hours as needed for abdominal pain and cramping. Push fluids, gradually avance your diet over the next 2-3 days.  We made you an appointment with Dr. Zenovia Jarred for 08-21-2018 at 9:45 am.  We will do a referral to [facility], Right kidney stones.  You will get a call with the date and time of the appointment.   Normal BMI (Body Mass Index- based on height and weight) is between 19 and 25. Your BMI today is Body mass index is 27.98 kg/m. Marland Kitchen Please consider follow up  regarding your BMI with your Primary Care Provider.

## 2018-07-18 NOTE — Progress Notes (Addendum)
Subjective:    Patient ID: Emily Calhoun, female    DOB: 1956-07-04, 63 y.o.   MRN: [id number]  HPI Emily Calhoun is a pleasant 63 year old white female, known to [name] who comes in today after an ER visit yesterday with acute severe abdominal pain, nausea and multiple episodes of diarrhea and hematochezia. Patient has history of PSVT, hypothyroidism, cervical CA, hemorrhoids, and had a surgery in 2016, at [facility] for a paring cyst at her vaginal cuff. Patient had colonoscopy on 11/21/2017 She has remote history of adenomatous colon polyps.  She did not have any polyps , and only pertinent finding was more diverticulosis. EGD in March 2017 was normal.  Patient says her symptoms started on New Year[place] Eve with some sharp pains in her lower abdomen.  Says initially this was not severe, but she did not feel well and went to bed.  She woke up at about 11 PM with severe abdominal pain nausea, diaphoresis and urgency for bowel movement.  She says she then filled the commode with blood, clots and diarrheal stool.  After that she had bowel movements every 2-3 hours, each time passing diarrhea and some clots.  Symptoms lasted throughout the night and then the morning with persistent nausea abdominal pain that her she presented to the emergency room. Labs showed WBC 7.2, hemoglobin 15.1, hematocrit of 46.3 chemistries unremarkable. CT of the abdomen and pelvis was done with oral but no IV contrast and she was noted to have nonobstructive kidney stones the lower pole of the right kidney, and otherwise no intra-abdominal or pelvic abnormality.  She says the bleeding stopped yesterday she has not had any bowel movements last night or today so far.  Abdominal pain has improved but not resolved.  She says she still feels a little diaphoretic off and on and appetite has been decreased.  She was able to eat very light yesterday so far today is just had yogurt.  She says the only thing that she did different prior to onset  of the acute episode was lift several 60 pound bales of hay that afternoon She had not been on any new medications, over-the-counter meds cold medications antibiotics etc. She says she typically keeps herself very well-hydrated.  She feels that she has had other episodes fairly similar in the past though not this severe and never associated with bleeding.  She thinks she has been told in the past that she may have had some bowel ischemia but never had work-up for these episodes.  She is concerned about whether this may be related to the recurrent vaginal cuff cyst, as she is been having some ongoing pelvic discomfort and discomfort with intercourse.  She is also been having occurring right-sided back pain wonders if that may be due to the stones seen on CT.  She does have planned follow-up with her gynecologist in the next month or so.  Review of Systems Pertinent positive and negative review of systems were noted in the above HPI section.  All other review of systems was otherwise negative.  Outpatient Encounter Medications as of 07/18/2018  Medication Sig  . acyclovir (ZOVIRAX) 400 MG tablet Take 1 tablet (400 mg total) by mouth daily.  . clonazePAM (KLONOPIN) 1 MG tablet Take 0.5 to 1 mg by mouth daily at bedtime as needed for sleep.  Marland Kitchen dicyclomine (BENTYL) 20 MG tablet Take 1 tablet (20 mg total) by mouth 2 (two) times daily.  Marland Kitchen escitalopram (LEXAPRO) 20 MG tablet Take 20 mg  by mouth daily.   Marland Kitchen esomeprazole (NEXIUM) 40 MG capsule Take 1 capsule (40 mg total) by mouth daily.  Marland Kitchen estradiol (VIVELLE-DOT) 0.05 MG/24HR patch Place 0.05 mg onto the skin daily.  . fluticasone (FLONASE) 50 MCG/ACT nasal spray Place 2 sprays into both nostrils daily.  Marland Kitchen gabapentin (NEURONTIN) 300 MG capsule Take 200 mg by mouth 3 (three) times daily.   Marland Kitchen levothyroxine (SYNTHROID, LEVOTHROID) 75 MCG tablet Take 75 mcg by mouth daily before breakfast.  . metoprolol tartrate (LOPRESSOR) 25 MG tablet Take 1 tablet (25 mg total)  by mouth 2 (two) times daily.  . [DISCONTINUED] estradiol (VIVELLE-DOT) 0.075 MG/24HR Place 1 patch onto the skin 2 (two) times a week.   . [DISCONTINUED] linaclotide (LINZESS) 72 MCG capsule Take 1 capsule (72 mcg total) by mouth daily.  . [DISCONTINUED] traMADol (ULTRAM) 50 MG tablet Take 1 tablet (50 mg total) by mouth every 6 (six) hours as needed. (Patient not taking: Reported on 07/17/2018)   Facility-Administered Encounter Medications as of 07/18/2018  Medication  . 0.9 %  sodium chloride infusion   Allergies  Allergen Reactions  . Azithromycin Anaphylaxis  . Cephalexin Anaphylaxis  . Cephalosporins Anaphylaxis  . Ketamine Anaphylaxis  . Lifitegrast Other (See Comments)    "causes eye infection"  . Metronidazole Anaphylaxis  . Minocin [Minocycline Hcl] Anaphylaxis  . Other Anaphylaxis  . Penicillins Anaphylaxis    DID THE REACTION INVOLVE: Swelling of the face/tongue/throat, SOB, or low BP? Y Sudden or severe rash/hives, skin peeling, or the inside of the mouth or nose? Y Did it require medical treatment? Y When did it last happen?Childhood Allergy If all above answers are "NO", may proceed with cephalosporin use.   . Sulfa Antibiotics Anaphylaxis  . Sulfonamide Derivatives Anaphylaxis  . Clarithromycin   . Effexor [Venlafaxine] Other (See Comments)    hallucinations  . Gentamicin Sulfate     REACTION: all mycins  . Iodinated Diagnostic Agents Itching    01-09-17 had 13 hour premed, and still had hives. She was given 50 of Benadryl and observed for 30 minutes.   Marland Kitchen Ketoconazole   . Macrobid [Nitrofurantoin]   . Morphine Sulfate   . Singulair [Montelukast Sodium]     Nervousness and depression per pt  . Tobramycin-Dexamethasone   . Wellbutrin [Bupropion]     Hallucinations    Patient Active Problem List   Diagnosis Date Noted  . Pain in right elbow 09/03/2017  . Acute pain of right shoulder 09/03/2017  . PSVT (paroxysmal supraventricular tachycardia) ([place])  02/22/2017  . Dyspnea 01/25/2017  . Chronic fatigue 07/24/2013  . Acute pain of left knee 07/24/2013  . Depression 03/12/2013  . Insomnia 03/12/2013  . Rash 02/21/2011  . ALLERGIC RHINITIS 08/18/2010  . CERVICAL CANCER, HX OF 11/02/2009  . CERVICAL CANCER 10/26/2009  . HYPOTHYROIDISM 10/26/2009  . 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 SINUSITIS, RECURRENT 10/26/2009  . Asthma 10/26/2009  . LOW BACK PAIN, CHRONIC 10/26/2009  . HEMORRHOIDS, HX OF 10/26/2009  . PERSONAL HISTORY OF URINARY CALCULI 10/26/2009   Social History   Socioeconomic History  . Marital status: Married    Spouse name: Not on file  . Number of children: 2  . Years of education: Not on file  . Highest education level: Not on file  Occupational History  . Occupation: Merchandiser, retail: UNEMPLOYED  Social Needs  . Financial resource strain: Not on file  . Food insecurity:    Worry: Not on file    Inability:  Not on file  . Transportation needs:    Medical: Not on file    Non-medical: Not on file  Tobacco Use  . Smoking status: Never Smoker  . Smokeless tobacco: Never Used  Substance and Sexual Activity  . Alcohol use: Yes    Alcohol/week: 8.0 standard drinks    Types: 8 Standard drinks or equivalent per week    Comment: 2 glasses wine nightly.    . Drug use: No  . Sexual activity: Yes    Partners: Male    Birth control/protection: Surgical    Comment: TAH  Lifestyle  . Physical activity:    Days per week: Not on file    Minutes per session: Not on file  . Stress: Not on file  Relationships  . Social connections:    Talks on phone: Not on file    Gets together: Not on file    Attends religious service: Not on file    Active member of club or organization: Not on file    Attends meetings of clubs or organizations: Not on file    Relationship status: Not on file  . Intimate partner violence:    Fear of current or ex partner: Not on file    Emotionally abused: Not on file    Physically  abused: Not on file    Forced sexual activity: Not on file  Other Topics Concern  . Not on file  Social History Narrative   Lives with husband in a 2 story home.  Has 2 children.     Works as an Futures trader.    Education: college.    Ms. Gulas family history includes Allergies in her mother; Clotting disorder in her father; Diabetes in her father and sister; Heart disease in her father and mother; Other in her son; Thyroid disease in her father and sister.      Objective:    Vitals:   07/18/18 1111  BP: 112/78  Pulse: 68    Physical Exam; well-developed older white female in no acute distress, accompanied by her husband, both pleasant blood pressure 112/78 pulse 68, BMI 27.9.  HEENT ;nontraumatic normocephalic EOMI PERRLA sclera anicteric oral mucosa moist, Cardiovascular ;regular rate and rhythm with S1-S2 no murmur rub or gallop, Pulmonary ;clear bilaterally, Abdomen; soft, nondistended, she has mild rather generalized tenderness across the lower abdomen there is no guarding or rebound no palpable mass or hepatosplenomegaly, bowel sounds are present.  Rectal ;exam not done, Extremities ;no clubbing cyanosis or edema skin warm dry, Neuropsych; alert and oriented, grossly nonfocal mood and affect appropriate       Assessment & Plan:   #61 63 year old white female presenting with acute episode on 07/16/2018 with severe mid and lower abdominal pain, cramping, diaphoresis, nausea, and then multiple episodes of bloody diarrheal stool and clots. Diarrhea and bleeding resolved within 24 hours, and abdominal pain has significantly improved though not completely resolved. CT scan without IV contrast unremarkable other than right nephrolithiasis.  Her history and symptoms are very consistent with a segmental ischemic colitis which is resolving.  #2 diverticulosis #3 remote history of adenomatous colon polyps-no polyps at recent colonoscopy May 2019 #4  recurrent vaginal cuff  cyst-status post resection, status post remote hysterectomy and C-section x2  Plan; Long discussion with the patient and her husband regarding  segmental ischemic colitis.  She was reassured that this is a self-limited injury to the colon that generally resolves without complication within 4 to 5 days.  I do  not think that any further work-up is indicated at this time, as symptoms have already significantly improved. She is encouraged to push fluids, start with full liquids and then gradually advance her diet as tolerated over the next few days She was given a prescription for Bentyl through the emergency room has not started yet.  She can use Bentyl 10 mg p.o. every 6 hours as needed for abdominal pain and cramping She is asked to get easy over the next few days, no strenuous exercise etc.  I do not think the segmental ischemic colitis is accounting for her more persistent pelvic discomfort and dyspareunia.  She is encouraged to follow-up with her gynecologist at [facility].  She would like referral to urology to discuss management of the nephrolithiasis.  Referral will be made.  She will follow-up with [name] in 1 month, and knows to call for any recurrent problems in the interim.  Greater than 50% of visit spent in occasion and counseling regarding disease  management.  Emily S  [name] 07/18/2018   Addendum: Reviewed and agree with assessment and management plan. Pyrtle, Lajuan Lines, MD     Cc: [name], MD

## 2018-07-22 ENCOUNTER — Encounter

## 2018-07-22 ENCOUNTER — Ambulatory Visit: Payer: 59 | Admitting: Nurse Practitioner

## 2018-07-24 ENCOUNTER — Telehealth: Payer: Self-pay | Admitting: Physician Assistant

## 2018-07-24 NOTE — Telephone Encounter (Signed)
Referral after seeing Perina Trellis Paganini, [name] was for urology. We will work on this

## 2018-07-24 NOTE — Telephone Encounter (Signed)
Called the patient to advise we faxed the office note on 07-19-18 and we are waiting for an appointment.  I told her I would call her once I find out when the appointment is. The patient thanked me for letting her know.

## 2018-07-24 NOTE — Telephone Encounter (Signed)
Faxed office note, demographics and insurance card information to [facility] on 07-19-2018.  [facility] today, 07-24-18 and spoke to triage nurse Mickel Baas.  Advised her the patient needs an appointment soon. She saw [name] on 1-2 and is having back pain and kidney stone problems.  We have not heard anything about an appointment yet.  Mickel Baas said she has the referral I faxed over and will try to get this patient as soon as she can.

## 2018-07-24 NOTE — Telephone Encounter (Signed)
[facility] again and per Mickel Baas she is in contact with the patient. [facility] does not have any appointments available at this time.  I advised the patient called [name] earlier today asking about her appointment .

## 2018-07-24 NOTE — Telephone Encounter (Signed)
Pt called stating that she was going to be referred to a kidney doctor but has not heard from [name] yet regarding that.

## 2018-07-24 NOTE — Telephone Encounter (Signed)
Follow up:   Patient state she has sent message threw my chart. Patient would like for someone to call her back today. [phone]. Also patient states she was in a hospital on 07/17/18. Also medication change.

## 2018-07-24 NOTE — Telephone Encounter (Signed)
Are you working on this for the pt?

## 2018-07-31 DIAGNOSIS — R102 Pelvic and perineal pain: Secondary | ICD-10-CM | POA: Diagnosis not present

## 2018-07-31 DIAGNOSIS — N200 Calculus of kidney: Secondary | ICD-10-CM | POA: Diagnosis not present

## 2018-07-31 DIAGNOSIS — R350 Frequency of micturition: Secondary | ICD-10-CM | POA: Diagnosis not present

## 2018-07-31 MED FILL — clonazePAM 1 MG TABS: 1 | 30 days supply | Qty: 30 | Fill #1

## 2018-08-05 DIAGNOSIS — N3946 Mixed incontinence: Secondary | ICD-10-CM | POA: Diagnosis not present

## 2018-08-05 DIAGNOSIS — Z79899 Other long term (current) drug therapy: Secondary | ICD-10-CM | POA: Diagnosis not present

## 2018-08-05 DIAGNOSIS — E559 Vitamin D deficiency, unspecified: Secondary | ICD-10-CM | POA: Diagnosis not present

## 2018-08-05 DIAGNOSIS — K922 Gastrointestinal hemorrhage, unspecified: Secondary | ICD-10-CM | POA: Diagnosis not present

## 2018-08-05 DIAGNOSIS — M6281 Muscle weakness (generalized): Secondary | ICD-10-CM | POA: Diagnosis not present

## 2018-08-05 DIAGNOSIS — Z0189 Encounter for other specified special examinations: Secondary | ICD-10-CM | POA: Diagnosis not present

## 2018-08-05 DIAGNOSIS — G4700 Insomnia, unspecified: Secondary | ICD-10-CM | POA: Diagnosis not present

## 2018-08-05 DIAGNOSIS — E785 Hyperlipidemia, unspecified: Secondary | ICD-10-CM | POA: Diagnosis not present

## 2018-08-05 DIAGNOSIS — R102 Pelvic and perineal pain: Secondary | ICD-10-CM | POA: Diagnosis not present

## 2018-08-05 DIAGNOSIS — M62838 Other muscle spasm: Secondary | ICD-10-CM | POA: Diagnosis not present

## 2018-08-12 DIAGNOSIS — R102 Pelvic and perineal pain: Secondary | ICD-10-CM | POA: Diagnosis not present

## 2018-08-12 DIAGNOSIS — M62838 Other muscle spasm: Secondary | ICD-10-CM | POA: Diagnosis not present

## 2018-08-12 DIAGNOSIS — M6281 Muscle weakness (generalized): Secondary | ICD-10-CM | POA: Diagnosis not present

## 2018-08-12 DIAGNOSIS — R350 Frequency of micturition: Secondary | ICD-10-CM | POA: Diagnosis not present

## 2018-08-12 DIAGNOSIS — N3946 Mixed incontinence: Secondary | ICD-10-CM | POA: Diagnosis not present

## 2018-08-13 DIAGNOSIS — R102 Pelvic and perineal pain: Secondary | ICD-10-CM | POA: Diagnosis not present

## 2018-08-13 DIAGNOSIS — K529 Noninfective gastroenteritis and colitis, unspecified: Secondary | ICD-10-CM | POA: Diagnosis not present

## 2018-08-13 DIAGNOSIS — G8929 Other chronic pain: Secondary | ICD-10-CM | POA: Diagnosis not present

## 2018-08-15 DIAGNOSIS — G8929 Other chronic pain: Secondary | ICD-10-CM | POA: Diagnosis not present

## 2018-08-15 DIAGNOSIS — R102 Pelvic and perineal pain: Secondary | ICD-10-CM | POA: Diagnosis not present

## 2018-08-15 DIAGNOSIS — K529 Noninfective gastroenteritis and colitis, unspecified: Secondary | ICD-10-CM | POA: Diagnosis not present

## 2018-08-15 DIAGNOSIS — R350 Frequency of micturition: Secondary | ICD-10-CM | POA: Diagnosis not present

## 2018-08-15 DIAGNOSIS — Z90710 Acquired absence of both cervix and uterus: Secondary | ICD-10-CM | POA: Diagnosis not present

## 2018-08-20 DIAGNOSIS — L2084 Intrinsic (allergic) eczema: Secondary | ICD-10-CM | POA: Diagnosis not present

## 2018-08-20 DIAGNOSIS — L219 Seborrheic dermatitis, unspecified: Secondary | ICD-10-CM | POA: Diagnosis not present

## 2018-08-20 DIAGNOSIS — L299 Pruritus, unspecified: Secondary | ICD-10-CM | POA: Diagnosis not present

## 2018-08-20 DIAGNOSIS — F458 Other somatoform disorders: Secondary | ICD-10-CM | POA: Diagnosis not present

## 2018-08-20 DIAGNOSIS — L719 Rosacea, unspecified: Secondary | ICD-10-CM | POA: Diagnosis not present

## 2018-08-20 MED FILL — CLOBETASOL 0.05% SOLUTION: 0.05 | 25 days supply | Qty: 50 | Fill #0

## 2018-08-20 MED FILL — TRIAMCINOLONE 0.1% CREAM: 0.1 | 25 days supply | Qty: 80 | Fill #0

## 2018-08-20 MED FILL — AZELAIC ACID 15 % GEL: 15 | 25 days supply | Qty: 50 | Fill #0

## 2018-08-21 ENCOUNTER — Encounter: Payer: Self-pay | Admitting: Internal Medicine

## 2018-08-21 ENCOUNTER — Ambulatory Visit (INDEPENDENT_AMBULATORY_CARE_PROVIDER_SITE_OTHER): Payer: Commercial Managed Care - PPO | Admitting: Internal Medicine

## 2018-08-21 VITALS — BP 120/80 | HR 68 | Ht 62.0 in | Wt 151.6 lb

## 2018-08-21 DIAGNOSIS — R140 Abdominal distension (gaseous): Secondary | ICD-10-CM | POA: Diagnosis not present

## 2018-08-21 DIAGNOSIS — K581 Irritable bowel syndrome with constipation: Secondary | ICD-10-CM | POA: Diagnosis not present

## 2018-08-21 DIAGNOSIS — Z8719 Personal history of other diseases of the digestive system: Secondary | ICD-10-CM | POA: Diagnosis not present

## 2018-08-21 NOTE — Patient Instructions (Signed)
You had an episode of ischemic colitis in January.  Please purchase the following medications over the counter and take as directed: Benefiber 2 heaping tablespoons daily. Align once daily OR Florastor twice daily  Call our office if you are no better in 1 month.  Please follow up with Dr Hilarie Fredrickson in 3 months.  If you are age 64 or older, your body mass index should be between 23-30. Your Body mass index is 27.73 kg/m. If this is out of the aforementioned range listed, please consider follow up with your Primary Care Provider.  If you are age 70 or younger, your body mass index should be between 19-25. Your Body mass index is 27.73 kg/m. If this is out of the aformentioned range listed, please consider follow up with your Primary Care Provider.

## 2018-08-21 NOTE — Progress Notes (Signed)
Subjective:    Patient ID: Emily Calhoun, female    DOB: 06/29/1956, 63 y.o.   MRN: [id number]  HPI Emily Calhoun is a 63 year old female with a past medical history of remote adenomatous polyps, colonic diverticulosis, chronic constipation, IBS, recent ischemic colitis, hypothyroidism, degenerative disc disease, history of cervical cancer status post hysterectomy who is here for follow-up.  She is here alone today and was last seen on 07/18/2018 by Nicoletta Ba, [name]  At the time of her last visit she was getting over an acute episode of abdominal pain and bloody stool.  This was preceded by several weeks of diarrhea and then a day or so of constipation feeling.  She then had abdominal pain and 3 or 4 episodes of hematochezia.  She was seen in the emergency department.  Her CT of the abdomen pelvis was unremarkable but clinically the presentation fit with ischemic colitis.  Today she reports that she is certainly feeling better though she is having issues with constipation.  No further bleeding.  She continues to be constipation predominant with hard stool.  She tries a probiotic but not consistently.  She uses Linzess as needed but it often gives her extreme diarrhea.  She does have some lower abdominal pressure and discomfort.  She continues to be very active and enjoys horse riding on a regular basis.  She has had some issues with lower back pain.  She is also followed up recently with her gynecologist at [facility].  She has been seen by urology with a history of kidney stones.  She also saw a pelvic floor physical therapist, Darryll Capers, at [facility] urology.  She had several episodes of pelvic floor physical therapy and felt that this was not helpful and caused her to significant pelvic discomfort.  She has had issues recently with pruritic eruptions on her hands and in her scalp.   Review of Systems As per HPI, otherwise negative  Current Medications, Allergies, Past Medical History, Past Surgical  History, Family History and Social History were reviewed in [employer] record.      Objective:   Physical Exam BP 120/80   Pulse 68   Ht 5\' 2"  (1.575 m)   Wt 151 lb 9.6 oz (68.8 kg)   LMP 07/17/1986   BMI 27.73 kg/m  Constitutional: Well-developed and well-nourished. No distress. HEENT: Normocephalic and atraumatic.  Conjunctivae are normal.  No scleral icterus. Neck: Neck supple. Trachea midline. Cardiovascular: Normal rate, regular rhythm and intact distal pulses.  Pulmonary/chest: Effort normal and breath sounds normal. No wheezing, rales or rhonchi. Abdominal: Soft, nontender, nondistended. Bowel sounds active throughout. There are no masses palpable. No hepatosplenomegaly. Extremities: no clubbing, cyanosis, or edema Neurological: Alert and oriented to person place and time. Skin: Skin is warm and dry. Psychiatric: Normal mood and affect. Behavior is normal.  CBC    Component Value Date/Time   WBC 7.2 07/17/2018 1346   RBC 4.58 07/17/2018 1346   HGB 15.1 (H) 07/17/2018 1346   HGB 14.4 05/03/2010 1107   HCT 46.3 (H) 07/17/2018 1346   HCT 43.5 05/03/2010 1107   PLT 251 07/17/2018 1346   PLT 277 05/03/2010 1107   MCV 101.1 (H) 07/17/2018 1346   MCV 96.8 05/03/2010 1107   MCH 33.0 07/17/2018 1346   MCHC 32.6 07/17/2018 1346   RDW 12.9 07/17/2018 1346   RDW 13.4 05/03/2010 1107   LYMPHSABS 1.2 07/17/2018 1346   LYMPHSABS 0.7 (L) 05/03/2010 1107   MONOABS 0.6 07/17/2018  1346   MONOABS 0.4 05/03/2010 1107   EOSABS 0.2 07/17/2018 1346   EOSABS 0.2 05/03/2010 1107   BASOSABS 0.1 07/17/2018 1346   BASOSABS 0.0 05/03/2010 1107   CT ABDOMEN AND PELVIS WITHOUT CONTRAST   TECHNIQUE: Multidetector CT imaging of the abdomen and pelvis was performed following the standard protocol without IV contrast.   COMPARISON:  09/23/2012 CT   FINDINGS: Lower chest: No acute consolidation or effusion. Mild atelectasis or scar at the right middle lobe.  Borderline heart size.   Hepatobiliary: No focal liver abnormality is seen. No gallstones, gallbladder wall thickening, or biliary dilatation.   Pancreas: Unremarkable. No pancreatic ductal dilatation or surrounding inflammatory changes.   Spleen: Normal in size without focal abnormality.   Adrenals/Urinary Tract: Adrenal glands are normal. Prominent right extrarenal pelvis without calyceal dilatation. Punctate stones in the lower pole of the right kidney. Bladder normal   Stomach/Bowel: Stomach is within normal limits. Appendix appears normal. No evidence of bowel wall thickening, distention, or inflammatory changes.   Vascular/Lymphatic: No significant vascular findings are present. No enlarged abdominal or pelvic lymph nodes.   Reproductive: Status post hysterectomy. No adnexal masses.   Other: No abdominal wall hernia or abnormality. No abdominopelvic ascites.   Musculoskeletal: No acute or significant osseous findings.   IMPRESSION: 1. No CT evidence for acute intra-abdominal or pelvic abnormality. 2. Nonobstructing stones in the right kidney     Electronically Signed   By: Donavan Foil M.D.   On: 07/17/2018 17:20      Assessment & Plan:  63 year old female with a past medical history of remote adenomatous polyps, colonic diverticulosis, chronic constipation, IBS, recent ischemic colitis, hypothyroidism, degenerative disc disease, history of cervical cancer status post hysterectomy who is here for follow-up.   1.  Ischemic colitis --episode in January 2020.  Resolved abdominal pain and associated bleeding.  See #2  2.  IBS with constipation predominance and abdominal bloating--this continues to be an issue and is longstanding for her.  She is up-to-date with her colonoscopy with her last exam in 2019 showing only diverticulosis.  We discussed how certainly constipation seems to somehow precipitate episodes of ischemic colitis and having more regular bowel movement  should improve symptoms of abdominal discomfort, bloating and bowel regularity and hopefully prevent further attacks of ischemia. --Begin Benefiber 2 tablespoons a day.  She has been somewhat inconsistent with probiotic which can worsen abdominal bloating.  If she chooses to use a probiotic I would recommend either Align once daily or Florastor 250 mg twice daily on a consistent basis. --She is asked to try Benefiber for 1 month and if not improved bowel regularity and constipation then I recommend we try [place].  She has tried Linzess and MiraLAX without good response.  Follow-up in about 3 months, sooner if needed 25 minutes spent with the patient today. Greater than 50% was spent in counseling and coordination of care with the patient

## 2018-08-22 DIAGNOSIS — R1030 Lower abdominal pain, unspecified: Secondary | ICD-10-CM | POA: Diagnosis not present

## 2018-08-22 DIAGNOSIS — R197 Diarrhea, unspecified: Secondary | ICD-10-CM | POA: Diagnosis not present

## 2018-08-22 DIAGNOSIS — D803 Selective deficiency of immunoglobulin G [IgG] subclasses: Secondary | ICD-10-CM | POA: Diagnosis not present

## 2018-08-22 DIAGNOSIS — K210 Gastro-esophageal reflux disease with esophagitis: Secondary | ICD-10-CM | POA: Diagnosis not present

## 2018-08-22 DIAGNOSIS — Z79899 Other long term (current) drug therapy: Secondary | ICD-10-CM | POA: Diagnosis not present

## 2018-08-22 DIAGNOSIS — R112 Nausea with vomiting, unspecified: Secondary | ICD-10-CM | POA: Diagnosis not present

## 2018-08-22 DIAGNOSIS — K625 Hemorrhage of anus and rectum: Secondary | ICD-10-CM | POA: Diagnosis not present

## 2018-08-26 DIAGNOSIS — H2513 Age-related nuclear cataract, bilateral: Secondary | ICD-10-CM | POA: Diagnosis not present

## 2018-08-26 DIAGNOSIS — H40023 Open angle with borderline findings, high risk, bilateral: Secondary | ICD-10-CM | POA: Diagnosis not present

## 2018-08-26 DIAGNOSIS — L723 Sebaceous cyst: Secondary | ICD-10-CM | POA: Diagnosis not present

## 2018-09-05 DIAGNOSIS — H524 Presbyopia: Secondary | ICD-10-CM | POA: Diagnosis not present

## 2018-09-05 DIAGNOSIS — H2513 Age-related nuclear cataract, bilateral: Secondary | ICD-10-CM | POA: Diagnosis not present

## 2018-09-05 DIAGNOSIS — H40003 Preglaucoma, unspecified, bilateral: Secondary | ICD-10-CM | POA: Diagnosis not present

## 2018-09-05 MED FILL — LEVOTHYROXINE 75 MCG TABLET: 75 | 90 days supply | Qty: 90 | Fill #1

## 2018-09-06 MED FILL — clonazePAM 1 MG TABS: 1 | 30 days supply | Qty: 30 | Fill #0 | Status: TO

## 2018-09-19 MED FILL — ACYCLOVIR 400 MG TABLET: 400 | 90 days supply | Qty: 90 | Fill #0 | Status: TO

## 2018-09-19 MED FILL — ESOMEPRAZOLE MAG DR 40 MG C: 40 | 90 days supply | Qty: 90 | Fill #1

## 2018-09-25 ENCOUNTER — Other Ambulatory Visit: Payer: Self-pay

## 2018-09-25 ENCOUNTER — Encounter: Payer: Self-pay | Admitting: Interventional Cardiology

## 2018-09-25 ENCOUNTER — Ambulatory Visit (INDEPENDENT_AMBULATORY_CARE_PROVIDER_SITE_OTHER): Payer: 59 | Admitting: Interventional Cardiology

## 2018-09-25 VITALS — BP 108/64 | HR 61 | Ht 62.0 in | Wt 154.4 lb

## 2018-09-25 DIAGNOSIS — I10 Essential (primary) hypertension: Secondary | ICD-10-CM | POA: Diagnosis not present

## 2018-09-25 DIAGNOSIS — R55 Syncope and collapse: Secondary | ICD-10-CM | POA: Diagnosis not present

## 2018-09-25 DIAGNOSIS — E7800 Pure hypercholesterolemia, unspecified: Secondary | ICD-10-CM

## 2018-09-25 DIAGNOSIS — I447 Left bundle-branch block, unspecified: Secondary | ICD-10-CM

## 2018-09-25 DIAGNOSIS — I471 Supraventricular tachycardia: Secondary | ICD-10-CM | POA: Diagnosis not present

## 2018-09-25 NOTE — Patient Instructions (Signed)
Medication Instructions:  Your physician recommends that you continue on your current medications as directed. Please refer to the Current Medication list given to you today.  If you need a refill on your cardiac medications before your next appointment, please call your pharmacy.   Lab work: None If you have labs (blood work) drawn today and your tests are completely normal, you will receive your results only by: Marland Kitchen MyChart Message (if you have MyChart) OR . A paper copy in the mail If you have any lab test that is abnormal or we need to change your treatment, we will call you to review the results.  Testing/Procedures: None  Follow-Up: You have been referred to Dr. Lovena Le with our Electrophysiology team.  Your physician recommends that you schedule a follow-up appointment in late summer/early fall.   Any Other Special Instructions Will Be Listed Below (If Applicable).

## 2018-09-25 NOTE — Progress Notes (Signed)
Cardiology Office Note:    Date:  09/25/2018   ID:  Emily Calhoun, DOB 03-05-1956, MRN [id number]  PCP:  Emily Jordan, MD  Cardiologist:  Emily Grooms, MD   Referring MD: Emily Jordan, MD   Chief Complaint  Patient presents with  . Irregular Heart Beat    PSVT    History of Present Illness:    Emily Calhoun is a 63 y.o. female with a hx of PSVT, intermittent left bundle branch block fainting episodes felt to be related to vasovagal syncope, cervical and lumbar disc disease, and asthma.  Emily Calhoun is having significant difficulty with excessive heart rates between 150 to 180 bpm when she rides her show horse.  This is made even worse when the weather is hot.  Graft from her phone that have documented heart rates demonstrate heart rates from 150 to 180 bpm.  When the heart rate is extremely fast, she gets weak and has near syncope.  We have documented by telemetry that she has PSVT.  Beta-blocker therapy is been ongoing for approximately 1 year.  She is not able to tolerate more than 25 mg of metoprolol tartrate twice daily because of low blood pressures.     Past Medical History:  Diagnosis Date  . Anal fissure   . Anxiety   . Arthritis   . Asthma   . Asthma   . Autoimmune disease ([place])   . Cervical cancer [facility])    mom took DES  . Concussion    2 months- saw neurologist and everything was fine  . Depression   . GERD (gastroesophageal reflux disease)   . H/O transfusion of packed red blood cells   . Heart murmur   . Hepatitis A   . History of kidney stones   . Hyperlipidemia   . 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 IBS (irritable bowel syndrome)   . Migraine   . Migraine   . Seasonal allergies   . STD (sexually transmitted disease)    HSV II  . Sweet's disease   . Thyroid disease    hypothyroid    Past Surgical History:  Procedure Laterality Date  . ABDOMINAL HYSTERECTOMY  1988   ?retained ovaries for cx ca  . blephoplasty    . BREAST  LUMPECTOMY Left 2009   benign; ?cyst  . BREAST REDUCTION SURGERY Bilateral 09/2010  . COLONOSCOPY    . ELBOW SURGERY Right 1995   tendon repair  . ELBOW SURGERY Right   . EXCISION VAGINAL CYST  10-25-2009   benign squamous cyst  . FOOT SURGERY Right   . internal hemorrhoidectomy    . NASAL SINUS SURGERY     x 2  . TONSILLECTOMY    . UPPER GASTROINTESTINAL ENDOSCOPY      Current Medications: Current Meds  Medication Sig  . acyclovir (ZOVIRAX) 400 MG tablet Take 1 tablet (400 mg total) by mouth daily.  . clonazePAM (KLONOPIN) 1 MG tablet Take 0.5 to 1 mg by mouth daily at bedtime as needed for sleep.  Marland Kitchen dicyclomine (BENTYL) 20 MG tablet Take 1 tablet (20 mg total) by mouth 2 (two) times daily.  Marland Kitchen escitalopram (LEXAPRO) 20 MG tablet Take 20 mg by mouth daily.   Marland Kitchen esomeprazole (NEXIUM) 40 MG capsule Take 1 capsule (40 mg total) by mouth daily.  Marland Kitchen estradiol (VIVELLE-DOT) 0.05 MG/24HR patch Place 0.05 mg onto the skin daily.  . fluticasone (FLONASE) 50 MCG/ACT nasal spray Place 2  sprays into both nostrils daily.  Marland Kitchen gabapentin (NEURONTIN) 300 MG capsule Take 200 mg by mouth 3 (three) times daily.   Marland Kitchen levothyroxine (SYNTHROID, LEVOTHROID) 75 MCG tablet Take 75 mcg by mouth daily before breakfast.  . metoprolol tartrate (LOPRESSOR) 25 MG tablet Take 1 tablet (25 mg total) by mouth 2 (two) times daily.   Current Facility-Administered Medications for the 09/25/18 encounter (Office Visit) with Belva Crome, MD  Medication  . 0.9 %  sodium chloride infusion     Allergies:   Azithromycin; Cephalexin; Cephalosporins; Ketamine; Lifitegrast; Metronidazole; Minocin [minocycline hcl]; Morphine sulfate; Other; Penicillins; Sulfa antibiotics; Sulfonamide derivatives; Clarithromycin; Effexor [venlafaxine]; Gentamicin sulfate; Iodinated diagnostic agents; Ketoconazole; Macrobid [nitrofurantoin]; Singulair [montelukast sodium]; Tobramycin-dexamethasone; and Wellbutrin [bupropion]   Social History    Socioeconomic History  . Marital status: Married    Spouse name: Not on file  . Number of children: 2  . Years of education: Not on file  . Highest education level: Not on file  Occupational History  . Occupation: Merchandiser, retail: UNEMPLOYED  Social Needs  . Financial resource strain: Not on file  . Food insecurity:    Worry: Not on file    Inability: Not on file  . Transportation needs:    Medical: Not on file    Non-medical: Not on file  Tobacco Use  . Smoking status: Never Smoker  . Smokeless tobacco: Never Used  Substance and Sexual Activity  . Alcohol use: Yes    Alcohol/week: 8.0 standard drinks    Types: 8 Standard drinks or equivalent per week    Comment: 2 glasses wine nightly.    . Drug use: No  . Sexual activity: Yes    Partners: Male    Birth control/protection: Surgical    Comment: TAH  Lifestyle  . Physical activity:    Days per week: Not on file    Minutes per session: Not on file  . Stress: Not on file  Relationships  . Social connections:    Talks on phone: Not on file    Gets together: Not on file    Attends religious service: Not on file    Active member of club or organization: Not on file    Attends meetings of clubs or organizations: Not on file    Relationship status: Not on file  Other Topics Concern  . Not on file  Social History Narrative   Lives with husband in a 2 story home.  Has 2 children.     Works as an Futures trader.    Education: college.     Family History: The patient's family history includes Allergies in her mother; Clotting disorder in her father; Diabetes in her father and sister; Heart disease in her father and mother; Other in her son; Thyroid disease in her father and sister. There is no history of Colon cancer, Esophageal cancer, Stomach cancer, Pancreatic cancer, or Liver disease.  ROS:   Please see the history of present illness.    Hearing loss, irregular heartbeat, excessive fatigue, constipation,  anxiety, headache, rash, back pain, blood in stool, abdominal pain.  All other systems reviewed and are negative.  EKGs/Labs/Other Studies Reviewed:    The following studies were reviewed today: No new data  EKG:  EKG sinus rhythm with V1 and V2 T wave abnormality.  No change when compared to prior tracings.  Recent Labs: 07/17/2018: ALT 26; BUN 19; Creatinine, Ser 0.80; Hemoglobin 15.1; Platelets 251; Potassium 4.4; Sodium 140  Recent Lipid Panel No results found for: CHOL, TRIG, HDL, CHOLHDL, VLDL, LDLCALC, LDLDIRECT  Physical Exam:    VS:  BP 108/64   Pulse 61   Ht 5\' 2"  (1.575 m)   Wt 154 lb 6.4 oz (70 kg)   LMP 07/17/1986   SpO2 96%   BMI 28.24 kg/m     Wt Readings from Last 3 Encounters:  09/25/18 154 lb 6.4 oz (70 kg)  08/21/18 151 lb 9.6 oz (68.8 kg)  07/18/18 153 lb (69.4 kg)     GEN: Mildly overweight. No acute distress HEENT: Normal NECK: No JVD. LYMPHATICS: No lymphadenopathy CARDIAC: RRR.  No murmur, no gallop, no edema VASCULAR: 2+ bilateral pulses, no bruits RESPIRATORY:  Clear to auscultation without rales, wheezing or rhonchi  ABDOMEN: Soft, non-tender, non-distended, No pulsatile mass, MUSCULOSKELETAL: No deformity  SKIN: Warm and dry NEUROLOGIC:  Alert and oriented x 3 PSYCHIATRIC:  Normal affect   ASSESSMENT:    1. PSVT (paroxysmal supraventricular tachycardia) ([place])   2. LBBB (left bundle branch block)   3. Neurocardiogenic syncope   4. Essential hypertension   5. Hypercholesterolemia    PLAN:    In order of problems listed above:  1. Despite beta-blocker therapy, the patient has had multiple episodes of heart rates greater than 180 bpm associated with symptoms.  These episodes occur primarily with competitive riding and cause dizziness.  She has had occasion to faint in the remote past.  When we have tried to uptitrate beta-blocker therapy her blood pressure gets too low.  Will refer to EP for consideration of ablation and are alternative  medical regimen.   2. EKG today demonstrates normal conduction.   3. Prior history of neurocardiogenic syncope. 4. Blood pressures well controlled on relatively low dose metoprolol. 5. LDL target less than 100.  Known to have widely patent coronary arteries.  EP consultation to consider ablation.    Medication Adjustments/Labs and Tests Ordered: Current medicines are reviewed at length with the patient today.  Concerns regarding medicines are outlined above.  No orders of the defined types were placed in this encounter.  No orders of the defined types were placed in this encounter.   There are no Patient Instructions on file for this visit.   Signed, Emily Grooms, MD  09/25/2018 4:20 PM    [facility]

## 2018-09-26 NOTE — Addendum Note (Signed)
Addended by: Jacinta Shoe on: 09/26/2018 11:54 AM   Modules accepted: Orders

## 2018-10-03 ENCOUNTER — Telehealth: Payer: Self-pay

## 2018-10-03 MED FILL — DOTTI 0.05 MG/24HR PTTW: 0.05 | 84 days supply | Qty: 24 | Fill #2

## 2018-10-03 NOTE — Telephone Encounter (Signed)
Left detailed message per DPR.  Pt read MyChart message and did not respond.  Advised at this time her appt would be cancelled.  Advised to send message via MyChart if Pt would be interested in televisit.    Advised if no message received Pt would be rescheduled at a later time.

## 2018-10-09 ENCOUNTER — Institutional Professional Consult (permissible substitution): Payer: 59 | Admitting: Internal Medicine

## 2018-10-16 ENCOUNTER — Telehealth: Payer: Self-pay | Admitting: Internal Medicine

## 2018-10-16 NOTE — Telephone Encounter (Signed)
New message    Called patient about canceled 03.25.20 visit with Dr. Lovena Le to see if patient would like to reschedule. Left message asking patient to call back.

## 2018-10-24 NOTE — Telephone Encounter (Signed)
Await Pt response to schedule appt.  Closing at this time.

## 2018-10-25 MED FILL — ESCITALOPRAM 20 MG TABLET: 20 | 90 days supply | Qty: 90 | Fill #0

## 2018-10-25 MED FILL — METOPROLOL TARTRATE 25 MG T: 25 | 90 days supply | Qty: 180 | Fill #0

## 2018-10-30 MED FILL — GABAPENTIN 300 MG CAPSULE: 300 | 90 days supply | Qty: 180 | Fill #0

## 2018-10-31 ENCOUNTER — Telehealth (INDEPENDENT_AMBULATORY_CARE_PROVIDER_SITE_OTHER): Payer: 59 | Admitting: Internal Medicine

## 2018-10-31 DIAGNOSIS — I471 Supraventricular tachycardia: Secondary | ICD-10-CM | POA: Diagnosis not present

## 2018-10-31 DIAGNOSIS — I447 Left bundle-branch block, unspecified: Secondary | ICD-10-CM | POA: Diagnosis not present

## 2018-10-31 NOTE — Progress Notes (Signed)
Electrophysiology TeleHealth Note   Due to national recommendations of social distancing due to COVID 19, an audio/video telehealth visit is felt to be most appropriate for this patient at this time.  See MyChart message from today for the patient's consent to telehealth for [facility].   Date:  10/31/2018   ID:  Emily Calhoun, DOB [birth year], MRN [id number]  Location: patient's home  Provider location: [address], [place] Cardiologist - [place] Evaluation Performed: Follow-up visit  Chief Complaint:  "I am doing ok"  History of Present Illness:    Emily Calhoun is a 63 y.o. female who presents via audio/video conferencing for a telehealth visit today. She is a pleasant 63 yo woman with a h/o palpitations. She carries a diagnosis of palpitations and has worn a cardiac monitor 2 years ago. She had HR's in the 180 range. The patient has never had syncope. She rides horses and is fairly active and notes that her episodes occur when she is active. No edema. I have reviewed her old ECG's and I wonder if her intermittent LBBB is actually an unusual AP.  Since last being seen in our clinic, the patient reports doing very well.  Today, she denies symptoms of palpitations, chest pain, shortness of breath,  lower extremity edema, dizziness, presyncope, or syncope.  The patient is otherwise without complaint today.  The patient denies symptoms of fevers, chills, cough, or new SOB worrisome for COVID 19.  Past Medical History:  Diagnosis Date  . Anal fissure   . Anxiety   . Arthritis   . Asthma   . Asthma   . Autoimmune disease ([place])   . Cervical cancer [facility])    mom took DES  . Concussion    2 months- saw neurologist and everything was fine  . Depression   . GERD (gastroesophageal reflux disease)   . H/O transfusion of packed red blood cells   . Heart murmur   . Hepatitis A   . History of kidney stones   . Hyperlipidemia   . 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 IBS (irritable bowel syndrome)   . Migraine   . Migraine   . Seasonal allergies   . STD (sexually transmitted disease)    HSV II  . Sweet's disease   . Thyroid disease    hypothyroid    Past Surgical History:  Procedure Laterality Date  . ABDOMINAL HYSTERECTOMY  1988   ?retained ovaries for cx ca  . blephoplasty    . BREAST LUMPECTOMY Left 2009   benign; ?cyst  . BREAST REDUCTION SURGERY Bilateral 09/2010  . COLONOSCOPY    . ELBOW SURGERY Right 1995   tendon repair  . ELBOW SURGERY Right   . EXCISION VAGINAL CYST  10-25-2009   benign squamous cyst  . FOOT SURGERY Right   . internal hemorrhoidectomy    . NASAL SINUS SURGERY     x 2  . TONSILLECTOMY    . UPPER GASTROINTESTINAL ENDOSCOPY      Current Outpatient Medications  Medication Sig Dispense Refill  . acyclovir (ZOVIRAX) 400 MG tablet Take 1 tablet (400 mg total) by mouth daily. 90 tablet 4  . clonazePAM (KLONOPIN) 1 MG tablet Take 0.5 to 1 mg by mouth daily at bedtime as needed for sleep.    Marland Kitchen dicyclomine (BENTYL) 20 MG tablet Take 1 tablet (20 mg total) by mouth 2 (two) times daily. 20 tablet 0  .  escitalopram (LEXAPRO) 20 MG tablet Take 20 mg by mouth daily.   3  . esomeprazole (NEXIUM) 40 MG capsule Take 1 capsule (40 mg total) by mouth daily. 30 capsule 3  . estradiol (VIVELLE-DOT) 0.05 MG/24HR patch Place 0.05 mg onto the skin daily.  6  . fluticasone (FLONASE) 50 MCG/ACT nasal spray Place 2 sprays into both nostrils daily.    Marland Kitchen gabapentin (NEURONTIN) 300 MG capsule Take 200 mg by mouth 3 (three) times daily.   2  . levothyroxine (SYNTHROID, LEVOTHROID) 75 MCG tablet Take 75 mcg by mouth daily before breakfast.    . metoprolol tartrate (LOPRESSOR) 25 MG tablet Take 1 tablet (25 mg total) by mouth 2 (two) times daily. 180 tablet 2   Current Facility-Administered Medications  Medication Dose Route Frequency Provider Last Rate Last Dose  . 0.9 %  sodium chloride infusion  500 mL Intravenous Once  Pyrtle, Lajuan Lines, MD        Allergies:   Azithromycin; Cephalexin; Cephalosporins; Ketamine; Lifitegrast; Metronidazole; Minocin [minocycline hcl]; Morphine sulfate; Other; Penicillins; Sulfa antibiotics; Sulfonamide derivatives; Clarithromycin; Effexor [venlafaxine]; Gentamicin sulfate; Iodinated diagnostic agents; Ketoconazole; Macrobid [nitrofurantoin]; Singulair [montelukast sodium]; Tobramycin-dexamethasone; and Wellbutrin [bupropion]   Social History:  The patient  reports that she has never smoked. She has never used smokeless tobacco. She reports current alcohol use of about 8.0 standard drinks of alcohol per week. She reports that she does not use drugs.   Family History:  The patient's family history includes Allergies in her mother; Clotting disorder in her father; Diabetes in her father and sister; Heart disease in her father and mother; Other in her son; Thyroid disease in her father and sister.   ROS:  Please see the history of present illness.   All other systems are personally reviewed and negative.    Exam:    Vital Signs:  none  Well appearing, alert and conversant, regular work of breathing,  good skin color Eyes- anicteric, neuro- grossly intact, skin- no apparent rash or lesions or cyanosis, mouth- oral mucosa is pink   Labs/Other Tests and Data Reviewed:    Recent Labs: 07/17/2018: ALT 26; BUN 19; Creatinine, Ser 0.80; Hemoglobin 15.1; Platelets 251; Potassium 4.4; Sodium 140   Wt Readings from Last 3 Encounters:  09/25/18 154 lb 6.4 oz (70 kg)  08/21/18 151 lb 9.6 oz (68.8 kg)  07/18/18 153 lb (69.4 kg)      ASSESSMENT & PLAN:    1.  SVT - I cannot discern the mechanism of her arrhythm with certainty. She does have wide QRS tachy which could be rate related abherration. At times her QRS is wide and I have reviewed one ECG which could represent AP conduction, while other ECG 's show a narrow QRS. I have discussed the indications/risks/benefits of EP study and  catheter ablation and she will call us if she wishes to pursue catheter ablation. We are not providing these treatments currently due to the Covid outbreak but I would anticipated restarting in the next 4-6 weeks. 2. COVID 19 screen The patient denies symptoms of COVID 19 at this time.  The importance of social distancing was discussed today.  Follow-up:  We will call her in early June to consider either another visit or to proceed with EP study and ablation.  Current medicines are reviewed at length with the patient today.   The patient does not have concerns regarding her medicines.  The following changes were made today:  none  Labs/ tests ordered  today include: none No orders of the defined types were placed in this encounter.    Patient Risk:  after full review of this patients clinical status, I feel that they are at moderate risk at this time.  Today, I have spent 25 minutes with the patient with telehealth technology discussing the above issues .    Signed, Cristopher Peru, MD  10/31/2018 11:20 AM     [address] [place] [id number] [phone] (office) [phone] (fax)

## 2018-11-13 MED FILL — DOXYCYCLINE HYCLATE 100 MG: 100 | 14 days supply | Qty: 28 | Fill #0

## 2018-11-13 MED FILL — CHLORHEXIDINE 0.12% RINSE: 0.12 | 15 days supply | Qty: 473 | Fill #0

## 2018-11-13 MED FILL — ACETAMINOPHEN/COD #3 TABLET: 300-30 | 3 days supply | Qty: 10 | Fill #0

## 2018-11-13 MED FILL — clonazePAM 1 MG TABS: 1 | 30 days supply | Qty: 30 | Fill #0

## 2018-11-29 MED FILL — AZELAIC ACID 15 % GEL: 15 | 25 days supply | Qty: 50 | Fill #0

## 2018-11-29 MED FILL — ACYCLOVIR 400 MG TABLET: 400 | 90 days supply | Qty: 90 | Fill #0

## 2018-11-29 MED FILL — CLOBETASOL PROPIONATE 0.05: 0.05 | 25 days supply | Qty: 50 | Fill #0

## 2018-11-29 MED FILL — LEVOTHYROXINE 75 MCG TABLET: 75 | 90 days supply | Qty: 90 | Fill #0

## 2018-12-17 ENCOUNTER — Telehealth: Payer: Self-pay

## 2018-12-17 NOTE — Telephone Encounter (Signed)
Spoke with pt regarding covid-19 screening prior to appt. Pt sated she has not been in contact with anyone that may have covid-19 and have no symptoms.

## 2018-12-19 ENCOUNTER — Ambulatory Visit (INDEPENDENT_AMBULATORY_CARE_PROVIDER_SITE_OTHER): Payer: 59 | Admitting: Internal Medicine

## 2018-12-19 ENCOUNTER — Encounter: Payer: Self-pay | Admitting: Internal Medicine

## 2018-12-19 ENCOUNTER — Other Ambulatory Visit: Payer: Self-pay

## 2018-12-19 VITALS — BP 106/72 | HR 69 | Ht 62.0 in | Wt 154.6 lb

## 2018-12-19 DIAGNOSIS — I471 Supraventricular tachycardia: Secondary | ICD-10-CM | POA: Diagnosis not present

## 2018-12-19 LAB — BASIC METABOLIC PANEL
BUN/Creatinine Ratio: 24 (ref 12–28)
BUN: 19 mg/dL (ref 8–27)
CO2: 24 mmol/L (ref 20–29)
Calcium: 9.8 mg/dL (ref 8.7–10.3)
Chloride: 99 mmol/L (ref 96–106)
Creatinine, Ser: 0.8 mg/dL (ref 0.57–1.00)
GFR calc Af Amer: 91 mL/min/{1.73_m2} (ref >59)
GFR calc non Af Amer: 79 mL/min/{1.73_m2} (ref >59)
Glucose: 105 mg/dL — ABNORMAL HIGH (ref 65–99)
Potassium: 4.6 mmol/L (ref 3.5–5.2)
Sodium: 138 mmol/L (ref 134–144)

## 2018-12-19 LAB — CBC WITH DIFFERENTIAL/PLATELET
Basophils Absolute: 0.1 10*3/uL (ref 0.0–0.2)
Basos: 1 %
EOS (ABSOLUTE): 0.1 10*3/uL (ref 0.0–0.4)
Eos: 3 %
Hematocrit: 39 % (ref 34.0–46.6)
Hemoglobin: 13.6 g/dL (ref 11.1–15.9)
Immature Grans (Abs): 0 10*3/uL (ref 0.0–0.1)
Immature Granulocytes: 0 %
Lymphocytes Absolute: 1 10*3/uL (ref 0.7–3.1)
Lymphs: 20 %
MCH: 33.4 pg — ABNORMAL HIGH (ref 26.6–33.0)
MCHC: 34.9 g/dL (ref 31.5–35.7)
MCV: 96 fL (ref 79–97)
Monocytes Absolute: 0.4 10*3/uL (ref 0.1–0.9)
Monocytes: 9 %
Neutrophils Absolute: 3.3 10*3/uL (ref 1.4–7.0)
Neutrophils: 67 %
Platelets: 246 10*3/uL (ref 150–450)
RBC: 4.07 x10E6/uL (ref 3.77–5.28)
RDW: 11.9 % (ref 11.7–15.4)
WBC: 4.9 10*3/uL (ref 3.4–10.8)

## 2018-12-19 MED FILL — AZELAIC ACID 15 % GEL: 15 | 25 days supply | Qty: 50 | Fill #1

## 2018-12-19 MED FILL — CLOBETASOL PROPIONATE 0.05: 0.05 | 25 days supply | Qty: 50 | Fill #1

## 2018-12-19 NOTE — H&P (View-Only) (Signed)
HPI Emily Calhoun returns today for an in office visit for SVT. She has a h/o tachypalpitations and documented SVT at 160/min. She appears to have subtle delta wave on her ECG but not diagnostic. She has rate related LBBB as well. She has improved with beta blocker therapy but this makes her feel fatigued. No chest pain. She does have some syncope with her SVT. I suspect that there is a vagal component. She has fallen off of her horse with syncope and though her heart was racing with this episode.  Allergies  Allergen Reactions  . Azithromycin Anaphylaxis  . Cephalexin Anaphylaxis  . Cephalosporins Anaphylaxis  . Ketamine Anaphylaxis  . Lifitegrast Other (See Comments)    "causes eye infection"  . Metronidazole Anaphylaxis  . Minocin [Minocycline Hcl] Anaphylaxis  . Morphine Sulfate Anaphylaxis  . Other Anaphylaxis  . Penicillins Anaphylaxis    DID THE REACTION INVOLVE: Swelling of the face/tongue/throat, SOB, or low BP? Y Sudden or severe rash/hives, skin peeling, or the inside of the mouth or nose? Y Did it require medical treatment? Y When did it last happen?Childhood Allergy If all above answers are "NO", may proceed with cephalosporin use.   . Sulfa Antibiotics Anaphylaxis  . Sulfonamide Derivatives Anaphylaxis  . Clarithromycin   . Effexor [Venlafaxine] Other (See Comments)    hallucinations  . Gentamicin Sulfate     REACTION: all mycins  . Iodinated Diagnostic Agents Itching    01-09-17 had 13 hour premed, and still had hives. She was given 50 of Benadryl and observed for 30 minutes.   Marland Kitchen Ketoconazole   . Macrobid [Nitrofurantoin]   . Singulair [Montelukast Sodium]     Nervousness and depression per pt  . Tobramycin-Dexamethasone   . Wellbutrin [Bupropion]     Hallucinations      Current Outpatient Medications  Medication Sig Dispense Refill  . acyclovir (ZOVIRAX) 400 MG tablet Take 1 tablet (400 mg total) by mouth daily. 90 tablet 4  . clonazePAM  (KLONOPIN) 1 MG tablet Take 0.5 to 1 mg by mouth daily at bedtime as needed for sleep.    Marland Kitchen dicyclomine (BENTYL) 20 MG tablet Take 1 tablet (20 mg total) by mouth 2 (two) times daily. 20 tablet 0  . escitalopram (LEXAPRO) 20 MG tablet Take 20 mg by mouth daily.   3  . esomeprazole (NEXIUM) 40 MG capsule Take 1 capsule (40 mg total) by mouth daily. 30 capsule 3  . estradiol (VIVELLE-DOT) 0.05 MG/24HR patch Place 0.05 mg onto the skin daily.  6  . fluticasone (FLONASE) 50 MCG/ACT nasal spray Place 2 sprays into both nostrils daily.    Marland Kitchen gabapentin (NEURONTIN) 300 MG capsule Take 200 mg by mouth 3 (three) times daily.   2  . levothyroxine (SYNTHROID, LEVOTHROID) 75 MCG tablet Take 75 mcg by mouth daily before breakfast.    . metoprolol tartrate (LOPRESSOR) 25 MG tablet Take 1 tablet (25 mg total) by mouth 2 (two) times daily. 180 tablet 2   Current Facility-Administered Medications  Medication Dose Route Frequency Provider Last Rate Last Dose  . 0.9 %  sodium chloride infusion  500 mL Intravenous Once Pyrtle, Lajuan Lines, MD         Past Medical History:  Diagnosis Date  . Anal fissure   . Anxiety   . Arthritis   . Asthma   . Asthma   . Autoimmune disease ([place])   . Cervical cancer [facility])    mom took DES  .  Concussion    2 months- saw neurologist and everything was fine  . Depression   . GERD (gastroesophageal reflux disease)   . H/O transfusion of packed red blood cells   . Heart murmur   . Hepatitis A   . History of kidney stones   . Hyperlipidemia   . 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 IBS (irritable bowel syndrome)   . Migraine   . Migraine   . Seasonal allergies   . STD (sexually transmitted disease)    HSV II  . Sweet's disease   . Thyroid disease    hypothyroid    ROS:   All systems reviewed and negative except as noted in the HPI.   Past Surgical History:  Procedure Laterality Date  . ABDOMINAL HYSTERECTOMY  1988   ?retained ovaries for cx ca  .  blephoplasty    . BREAST LUMPECTOMY Left 2009   benign; ?cyst  . BREAST REDUCTION SURGERY Bilateral 09/2010  . COLONOSCOPY    . ELBOW SURGERY Right 1995   tendon repair  . ELBOW SURGERY Right   . EXCISION VAGINAL CYST  10-25-2009   benign squamous cyst  . FOOT SURGERY Right   . internal hemorrhoidectomy    . NASAL SINUS SURGERY     x 2  . TONSILLECTOMY    . UPPER GASTROINTESTINAL ENDOSCOPY       Family History  Problem Relation Age of Onset  . Allergies Mother   . Heart disease Mother   . Heart disease Father   . Clotting disorder Father        per pt had clot in is intestines  . Diabetes Father   . Thyroid disease Father   . Diabetes Sister   . Thyroid disease Sister   . Other Son        Hypogammaglobulinemia  . Colon cancer Neg Hx   . Esophageal cancer Neg Hx   . Stomach cancer Neg Hx   . Pancreatic cancer Neg Hx   . Liver disease Neg Hx      Social History   Socioeconomic History  . Marital status: Married    Spouse name: Not on file  . Number of children: 2  . Years of education: Not on file  . Highest education level: Not on file  Occupational History  . Occupation: Merchandiser, retail: UNEMPLOYED  Social Needs  . Financial resource strain: Not on file  . Food insecurity:    Worry: Not on file    Inability: Not on file  . Transportation needs:    Medical: Not on file    Non-medical: Not on file  Tobacco Use  . Smoking status: Never Smoker  . Smokeless tobacco: Never Used  Substance and Sexual Activity  . Alcohol use: Yes    Alcohol/week: 8.0 standard drinks    Types: 8 Standard drinks or equivalent per week    Comment: 2 glasses wine nightly.    . Drug use: No  . Sexual activity: Yes    Partners: Male    Birth control/protection: Surgical    Comment: TAH  Lifestyle  . Physical activity:    Days per week: Not on file    Minutes per session: Not on file  . Stress: Not on file  Relationships  . Social connections:    Talks on phone:  Not on file    Gets together: Not on file    Attends religious service: Not on file  Active member of club or organization: Not on file    Attends meetings of clubs or organizations: Not on file    Relationship status: Not on file  . Intimate partner violence:    Fear of current or ex partner: Not on file    Emotionally abused: Not on file    Physically abused: Not on file    Forced sexual activity: Not on file  Other Topics Concern  . Not on file  Social History Narrative   Lives with husband in a 2 story home.  Has 2 children.     Works as an Futures trader.    Education: college.     BP 106/72   Pulse 69   Ht 5\' 2"  (1.575 m)   Wt 154 lb 9.6 oz (70.1 kg)   LMP 07/17/1986   SpO2 97%   BMI 28.28 kg/m   Physical Exam:  Well appearing 63 yo woman, NAD HEENT: Unremarkable Neck:  6 cm JVD, no thyromegally Lymphatics:  No adenopathy Back:  No CVA tenderness Lungs:  Clear with no wheezes HEART:  Regular rate rhythm, no murmurs, no rubs, no clicks Abd:  soft, positive bowel sounds, no organomegally, no rebound, no guarding Ext:  2 plus pulses, no edema, no cyanosis, no clubbing Skin:  No rashes no nodules Neuro:  CN II through XII intact, motor grossly intact  EKG NSR with subtle pre-excitation, non-diagnostic  Assess/Plan: 1. Palpitations - it looks like this is due to SVT. I have recommended proceeding with EP study and catheter ablation. She wishes to proceed. 2. Probable autonomic dysfunction - I explained the mechanism of vagally mediated syncope. I explained that her spells could continue despite successful ablation as there can be multiple triggers for SVT.  3. HTN - her blood pressure appears to be controlled. We will follow.  Emily Calhoun.D.

## 2018-12-19 NOTE — Patient Instructions (Addendum)
Medication Instructions:  Your physician recommends that you continue on your current medications as directed. Please refer to the Current Medication list given to you today.  Labwork: You will get lab work today:  BMP and CBC.  Testing/Procedures: Your physician has recommended that you have an ablation. Catheter ablation is a medical procedure used to treat some cardiac arrhythmias (irregular heartbeats). During catheter ablation, a long, thin, flexible tube is put into a blood vessel in your groin (upper thigh), or neck. This tube is called an ablation catheter. It is then guided to your heart through the blood vessel. Radio frequency waves destroy small areas of heart tissue where abnormal heartbeats may cause an arrhythmia to start. Please see the instruction sheet given to you today.  Follow-Up:  You will follow up with Dr. Lovena Le 4 weeks after your procedure    ABLATION INSTRUCTIONS:  COVID test--You will go to [facility] building [phone]) on January 03, 2019 at 1:00 pm for a drive thru covid test.  You will stay in your car and the staff will come to you.  After your test we ask that you go home and self quarantine until the day of your procedure.  Please arrive to ADMITTING down the hall from the [place] main entrance A of [facility] at:  7:30 am on January 06, 2019  Do not eat or drink after midnight prior to procedure  Do NOT take your METOPROLOL for 2 days prior to your procedure.  Your last dose will be January 03, 2019 your PM dose.  On the morning of your procedure you may take the rest of your normal morning medications with a sip of water.  Plan for one night stay  You will need someone to drive you home at discharge  If you need a refill on your cardiac medications before your next appointment, please call your pharmacy.   Cardiac Ablation Cardiac ablation is a procedure to disable (ablate) a small amount of heart tissue in very specific places.  The heart has many electrical connections. Sometimes these connections are abnormal and can cause the heart to beat very fast or irregularly. Ablating some of the problem areas can improve the heart rhythm or return it to normal. Ablation may be done for people who:  Have Wolff-Parkinson-White syndrome.  Have fast heart rhythms (tachycardia).  Have taken medicines for an abnormal heart rhythm (arrhythmia) that were not effective or caused side effects.  Have a high-risk heartbeat that may be life-threatening. During the procedure, a small incision is made in the neck or the groin, and a long, thin, flexible tube (catheter) is inserted into the incision and moved to the heart. Small devices (electrodes) on the tip of the catheter will send out electrical currents. A type of X-ray (fluoroscopy) will be used to help guide the catheter and to provide images of the heart. Tell a health care provider about:  Any allergies you have.  All medicines you are taking, including vitamins, herbs, eye drops, creams, and over-the-counter medicines.  Any problems you or family members have had with anesthetic medicines.  Any blood disorders you have.  Any surgeries you have had.  Any medical conditions you have, such as kidney failure.  Whether you are pregnant or may be pregnant. What are the risks? Generally, this is a safe procedure. However, problems may occur, including:  Infection.  Bruising and bleeding at the catheter insertion site.  Bleeding into the chest, especially into  the sac that surrounds the heart. This is a serious complication.  Stroke or blood clots.  Damage to other structures or organs.  Allergic reaction to medicines or dyes.  Need for a permanent pacemaker if the normal electrical system is damaged. A pacemaker is a small computer that sends electrical signals to the heart and helps your heart beat normally.  The procedure not being fully effective. This may not be  recognized until months later. Repeat ablation procedures are sometimes required. What happens before the procedure?  Follow instructions from your health care provider about eating or drinking restrictions.  Ask your health care provider about: ? Changing or stopping your regular medicines. This is especially important if you are taking diabetes medicines or blood thinners. ? Taking medicines such as aspirin and ibuprofen. These medicines can thin your blood. Do not take these medicines before your procedure if your health care provider instructs you not to.  Plan to have someone take you home from the hospital or clinic.  If you will be going home right after the procedure, plan to have someone with you for 24 hours. What happens during the procedure?  To lower your risk of infection: ? Your health care team will wash or sanitize their hands. ? Your skin will be washed with soap. ? Hair may be removed from the incision area.  An IV tube will be inserted into one of your veins.  You will be given a medicine to help you relax (sedative).  The skin on your neck or groin will be numbed.  An incision will be made in your neck or your groin.  A needle will be inserted through the incision and into a large vein in your neck or groin.  A catheter will be inserted into the needle and moved to your heart.  Dye may be injected through the catheter to help your surgeon see the area of the heart that needs treatment.  Electrical currents will be sent from the catheter to ablate heart tissue in desired areas. There are three types of energy that may be used to ablate heart tissue: ? Heat (radiofrequency energy). ? Laser energy. ? Extreme cold (cryoablation).  When the necessary tissue has been ablated, the catheter will be removed.  Pressure will be held on the catheter insertion area to prevent excessive bleeding.  A bandage (dressing) will be placed over the catheter insertion  area. The procedure may vary among health care providers and hospitals. What happens after the procedure?  Your blood pressure, heart rate, breathing rate, and blood oxygen level will be monitored until the medicines you were given have worn off.  Your catheter insertion area will be monitored for bleeding. You will need to lie still for a few hours to ensure that you do not bleed from the catheter insertion area.  Do not drive for 24 hours or as long as directed by your health care provider. Summary  Cardiac ablation is a procedure to disable (ablate) a small amount of heart tissue in very specific places. Ablating some of the problem areas can improve the heart rhythm or return it to normal.  During the procedure, electrical currents will be sent from the catheter to ablate heart tissue in desired areas. This information is not intended to replace advice given to you by your health care provider. Make sure you discuss any questions you have with your health care provider. Document Released: 11/19/2008 Document Revised: 05/22/2016 Document Reviewed: 05/22/2016 Elsevier Interactive Patient Education  2019 [place].

## 2018-12-19 NOTE — Progress Notes (Signed)
HPI Emily Calhoun returns today for an in office visit for SVT. She has a h/o tachypalpitations and documented SVT at 160/min. She appears to have subtle delta wave on her ECG but not diagnostic. She has rate related LBBB as well. She has improved with beta blocker therapy but this makes her feel fatigued. No chest pain. She does have some syncope with her SVT. I suspect that there is a vagal component. She has fallen off of her horse with syncope and though her heart was racing with this episode.  Allergies  Allergen Reactions  . Azithromycin Anaphylaxis  . Cephalexin Anaphylaxis  . Cephalosporins Anaphylaxis  . Ketamine Anaphylaxis  . Lifitegrast Other (See Comments)    "causes eye infection"  . Metronidazole Anaphylaxis  . Minocin [Minocycline Hcl] Anaphylaxis  . Morphine Sulfate Anaphylaxis  . Other Anaphylaxis  . Penicillins Anaphylaxis    DID THE REACTION INVOLVE: Swelling of the face/tongue/throat, SOB, or low BP? Y Sudden or severe rash/hives, skin peeling, or the inside of the mouth or nose? Y Did it require medical treatment? Y When did it last happen?Childhood Allergy If all above answers are "NO", may proceed with cephalosporin use.   . Sulfa Antibiotics Anaphylaxis  . Sulfonamide Derivatives Anaphylaxis  . Clarithromycin   . Effexor [Venlafaxine] Other (See Comments)    hallucinations  . Gentamicin Sulfate     REACTION: all mycins  . Iodinated Diagnostic Agents Itching    01-09-17 had 13 hour premed, and still had hives. She was given 50 of Benadryl and observed for 30 minutes.   Marland Kitchen Ketoconazole   . Macrobid [Nitrofurantoin]   . Singulair [Montelukast Sodium]     Nervousness and depression per pt  . Tobramycin-Dexamethasone   . Wellbutrin [Bupropion]     Hallucinations      Current Outpatient Medications  Medication Sig Dispense Refill  . acyclovir (ZOVIRAX) 400 MG tablet Take 1 tablet (400 mg total) by mouth daily. 90 tablet 4  . clonazePAM  (KLONOPIN) 1 MG tablet Take 0.5 to 1 mg by mouth daily at bedtime as needed for sleep.    Marland Kitchen dicyclomine (BENTYL) 20 MG tablet Take 1 tablet (20 mg total) by mouth 2 (two) times daily. 20 tablet 0  . escitalopram (LEXAPRO) 20 MG tablet Take 20 mg by mouth daily.   3  . esomeprazole (NEXIUM) 40 MG capsule Take 1 capsule (40 mg total) by mouth daily. 30 capsule 3  . estradiol (VIVELLE-DOT) 0.05 MG/24HR patch Place 0.05 mg onto the skin daily.  6  . fluticasone (FLONASE) 50 MCG/ACT nasal spray Place 2 sprays into both nostrils daily.    Marland Kitchen gabapentin (NEURONTIN) 300 MG capsule Take 200 mg by mouth 3 (three) times daily.   2  . levothyroxine (SYNTHROID, LEVOTHROID) 75 MCG tablet Take 75 mcg by mouth daily before breakfast.    . metoprolol tartrate (LOPRESSOR) 25 MG tablet Take 1 tablet (25 mg total) by mouth 2 (two) times daily. 180 tablet 2   Current Facility-Administered Medications  Medication Dose Route Frequency Provider Last Rate Last Dose  . 0.9 %  sodium chloride infusion  500 mL Intravenous Once Pyrtle, Lajuan Lines, MD         Past Medical History:  Diagnosis Date  . Anal fissure   . Anxiety   . Arthritis   . Asthma   . Asthma   . Autoimmune disease ([place])   . Cervical cancer [facility])    mom took DES  .  Concussion    2 months- saw neurologist and everything was fine  . Depression   . GERD (gastroesophageal reflux disease)   . H/O transfusion of packed red blood cells   . Heart murmur   . Hepatitis A   . History of kidney stones   . Hyperlipidemia   . 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 IBS (irritable bowel syndrome)   . Migraine   . Migraine   . Seasonal allergies   . STD (sexually transmitted disease)    HSV II  . Sweet's disease   . Thyroid disease    hypothyroid    ROS:   All systems reviewed and negative except as noted in the HPI.   Past Surgical History:  Procedure Laterality Date  . ABDOMINAL HYSTERECTOMY  1988   ?retained ovaries for cx ca  .  blephoplasty    . BREAST LUMPECTOMY Left 2009   benign; ?cyst  . BREAST REDUCTION SURGERY Bilateral 09/2010  . COLONOSCOPY    . ELBOW SURGERY Right 1995   tendon repair  . ELBOW SURGERY Right   . EXCISION VAGINAL CYST  10-25-2009   benign squamous cyst  . FOOT SURGERY Right   . internal hemorrhoidectomy    . NASAL SINUS SURGERY     x 2  . TONSILLECTOMY    . UPPER GASTROINTESTINAL ENDOSCOPY       Family History  Problem Relation Age of Onset  . Allergies Mother   . Heart disease Mother   . Heart disease Father   . Clotting disorder Father        per pt had clot in is intestines  . Diabetes Father   . Thyroid disease Father   . Diabetes Sister   . Thyroid disease Sister   . Other Son        Hypogammaglobulinemia  . Colon cancer Neg Hx   . Esophageal cancer Neg Hx   . Stomach cancer Neg Hx   . Pancreatic cancer Neg Hx   . Liver disease Neg Hx      Social History   Socioeconomic History  . Marital status: Married    Spouse name: Not on file  . Number of children: 2  . Years of education: Not on file  . Highest education level: Not on file  Occupational History  . Occupation: Merchandiser, retail: UNEMPLOYED  Social Needs  . Financial resource strain: Not on file  . Food insecurity:    Worry: Not on file    Inability: Not on file  . Transportation needs:    Medical: Not on file    Non-medical: Not on file  Tobacco Use  . Smoking status: Never Smoker  . Smokeless tobacco: Never Used  Substance and Sexual Activity  . Alcohol use: Yes    Alcohol/week: 8.0 standard drinks    Types: 8 Standard drinks or equivalent per week    Comment: 2 glasses wine nightly.    . Drug use: No  . Sexual activity: Yes    Partners: Male    Birth control/protection: Surgical    Comment: TAH  Lifestyle  . Physical activity:    Days per week: Not on file    Minutes per session: Not on file  . Stress: Not on file  Relationships  . Social connections:    Talks on phone:  Not on file    Gets together: Not on file    Attends religious service: Not on file  Active member of club or organization: Not on file    Attends meetings of clubs or organizations: Not on file    Relationship status: Not on file  . Intimate partner violence:    Fear of current or ex partner: Not on file    Emotionally abused: Not on file    Physically abused: Not on file    Forced sexual activity: Not on file  Other Topics Concern  . Not on file  Social History Narrative   Lives with husband in a 2 story home.  Has 2 children.     Works as an Futures trader.    Education: college.     BP 106/72   Pulse 69   Ht 5\' 2"  (1.575 m)   Wt 154 lb 9.6 oz (70.1 kg)   LMP 07/17/1986   SpO2 97%   BMI 28.28 kg/m   Physical Exam:  Well appearing 63 yo woman, NAD HEENT: Unremarkable Neck:  6 cm JVD, no thyromegally Lymphatics:  No adenopathy Back:  No CVA tenderness Lungs:  Clear with no wheezes HEART:  Regular rate rhythm, no murmurs, no rubs, no clicks Abd:  soft, positive bowel sounds, no organomegally, no rebound, no guarding Ext:  2 plus pulses, no edema, no cyanosis, no clubbing Skin:  No rashes no nodules Neuro:  CN II through XII intact, motor grossly intact  EKG NSR with subtle pre-excitation, non-diagnostic  Assess/Plan: 1. Palpitations - it looks like this is due to SVT. I have recommended proceeding with EP study and catheter ablation. She wishes to proceed. 2. Probable autonomic dysfunction - I explained the mechanism of vagally mediated syncope. I explained that her spells could continue despite successful ablation as there can be multiple triggers for SVT.  3. HTN - her blood pressure appears to be controlled. We will follow.  Mikle Bosworth.D.

## 2019-01-01 MED FILL — ESOMEPRAZOLE MAG DR 40 MG C: 40 | 90 days supply | Qty: 90 | Fill #2

## 2019-01-02 MED FILL — clonazePAM 1 MG TABS: 1 | 30 days supply | Qty: 30 | Fill #0

## 2019-01-03 ENCOUNTER — Other Ambulatory Visit (HOSPITAL_COMMUNITY)
Admission: RE | Admit: 2019-01-03 | Discharge: 2019-01-03 | Disposition: A | Discharge status: Home / Self Care | Payer: 59 | Source: Ambulatory Visit | Attending: Internal Medicine | Admitting: Internal Medicine

## 2019-01-03 DIAGNOSIS — Z1159 Encounter for screening for other viral diseases: Secondary | ICD-10-CM | POA: Diagnosis not present

## 2019-01-03 LAB — SARS CORONAVIRUS 2 (TAT 6-24 HRS): SARS Coronavirus 2: NEGATIVE

## 2019-01-06 ENCOUNTER — Ambulatory Visit (HOSPITAL_COMMUNITY)
Admission: RE | Admit: 2019-01-06 | Discharge: 2019-01-06 | Disposition: A | Discharge status: Home / Self Care | Payer: 59 | Attending: Internal Medicine | Admitting: Internal Medicine

## 2019-01-06 ENCOUNTER — Ambulatory Visit (HOSPITAL_COMMUNITY)
Admission: RE | Disposition: A | Discharge status: Home / Self Care | Payer: Self-pay | Source: Home / Self Care | Attending: Internal Medicine

## 2019-01-06 ENCOUNTER — Other Ambulatory Visit: Payer: Self-pay

## 2019-01-06 DIAGNOSIS — I471 Supraventricular tachycardia: Secondary | ICD-10-CM

## 2019-01-06 DIAGNOSIS — Z8349 Family history of other endocrine, nutritional and metabolic diseases: Secondary | ICD-10-CM | POA: Diagnosis not present

## 2019-01-06 DIAGNOSIS — Z881 Allergy status to other antibiotic agents status: Secondary | ICD-10-CM | POA: Insufficient documentation

## 2019-01-06 DIAGNOSIS — E039 Hypothyroidism, unspecified: Secondary | ICD-10-CM | POA: Insufficient documentation

## 2019-01-06 DIAGNOSIS — Z79899 Other long term (current) drug therapy: Secondary | ICD-10-CM | POA: Diagnosis not present

## 2019-01-06 DIAGNOSIS — J45909 Unspecified asthma, uncomplicated: Secondary | ICD-10-CM | POA: Insufficient documentation

## 2019-01-06 DIAGNOSIS — Z883 Allergy status to other anti-infective agents status: Secondary | ICD-10-CM | POA: Diagnosis not present

## 2019-01-06 DIAGNOSIS — Z888 Allergy status to other drugs, medicaments and biological substances status: Secondary | ICD-10-CM | POA: Insufficient documentation

## 2019-01-06 DIAGNOSIS — Z79890 Hormone replacement therapy (postmenopausal): Secondary | ICD-10-CM | POA: Insufficient documentation

## 2019-01-06 DIAGNOSIS — E785 Hyperlipidemia, unspecified: Secondary | ICD-10-CM | POA: Diagnosis not present

## 2019-01-06 DIAGNOSIS — Z885 Allergy status to narcotic agent status: Secondary | ICD-10-CM | POA: Insufficient documentation

## 2019-01-06 DIAGNOSIS — Z880 Allergy status to penicillin: Secondary | ICD-10-CM | POA: Diagnosis not present

## 2019-01-06 DIAGNOSIS — Z8249 Family history of ischemic heart disease and other diseases of the circulatory system: Secondary | ICD-10-CM | POA: Diagnosis not present

## 2019-01-06 DIAGNOSIS — D801 Nonfamilial hypogammaglobulinemia: Secondary | ICD-10-CM | POA: Insufficient documentation

## 2019-01-06 DIAGNOSIS — Z90710 Acquired absence of both cervix and uterus: Secondary | ICD-10-CM | POA: Insufficient documentation

## 2019-01-06 DIAGNOSIS — K589 Irritable bowel syndrome without diarrhea: Secondary | ICD-10-CM | POA: Diagnosis not present

## 2019-01-06 DIAGNOSIS — K219 Gastro-esophageal reflux disease without esophagitis: Secondary | ICD-10-CM | POA: Diagnosis not present

## 2019-01-06 DIAGNOSIS — Z882 Allergy status to sulfonamides status: Secondary | ICD-10-CM | POA: Insufficient documentation

## 2019-01-06 DIAGNOSIS — I447 Left bundle-branch block, unspecified: Secondary | ICD-10-CM | POA: Insufficient documentation

## 2019-01-06 DIAGNOSIS — M1990 Unspecified osteoarthritis, unspecified site: Secondary | ICD-10-CM | POA: Insufficient documentation

## 2019-01-06 HISTORY — PX: SVT ABLATION: EP1225

## 2019-01-06 SURGERY — SVT ABLATION

## 2019-01-06 MED ORDER — ISOPROTERENOL HCL 0.2 MG/ML IJ SOLN
INTRAMUSCULAR | Status: AC
  Filled 2019-01-06: qty 5

## 2019-01-06 MED ORDER — MIDAZOLAM HCL 5 MG/5ML IJ SOLN
INTRAMUSCULAR | Status: AC
  Filled 2019-01-06: qty 5

## 2019-01-06 MED ORDER — SODIUM CHLORIDE 0.9 % IV SOLN
INTRAVENOUS | Status: AC | PRN
  Administered 2019-01-06: 2 ug/min via INTRAVENOUS

## 2019-01-06 MED ORDER — FENTANYL CITRATE (PF) 100 MCG/2ML IJ SOLN
INTRAMUSCULAR | Status: AC
  Filled 2019-01-06: qty 2

## 2019-01-06 MED ORDER — MIDAZOLAM HCL 5 MG/5ML IJ SOLN
INTRAMUSCULAR | Status: DC | PRN
  Administered 2019-01-06: 2 mg via INTRAVENOUS
  Administered 2019-01-06 (×3): 1 mg via INTRAVENOUS
  Administered 2019-01-06: 2 mg via INTRAVENOUS
  Administered 2019-01-06: 1 mg via INTRAVENOUS

## 2019-01-06 MED ORDER — ACETAMINOPHEN 325 MG PO TABS
650.0000 mg | ORAL_TABLET | ORAL | Status: DC | PRN
  Filled 2019-01-06: qty 2

## 2019-01-06 MED ORDER — BUPIVACAINE HCL (PF) 0.25 % IJ SOLN
INTRAMUSCULAR | Status: AC
  Filled 2019-01-06: qty 30

## 2019-01-06 MED ORDER — SODIUM CHLORIDE 0.9 % IV SOLN: 250.0000 mL | INTRAVENOUS | Status: DC | PRN

## 2019-01-06 MED ORDER — SODIUM CHLORIDE 0.9% FLUSH: 3.0000 mL | INTRAVENOUS | Status: DC | PRN

## 2019-01-06 MED ORDER — ATROPINE SULFATE 1 MG/10ML IJ SOSY
PREFILLED_SYRINGE | INTRAMUSCULAR | Status: AC
  Filled 2019-01-06: qty 10

## 2019-01-06 MED ORDER — FENTANYL CITRATE (PF) 100 MCG/2ML IJ SOLN
INTRAMUSCULAR | Status: DC | PRN
  Administered 2019-01-06: 25 ug via INTRAVENOUS
  Administered 2019-01-06: 12.5 ug via INTRAVENOUS
  Administered 2019-01-06 (×2): 25 ug via INTRAVENOUS
  Administered 2019-01-06: 12.5 ug via INTRAVENOUS

## 2019-01-06 MED ORDER — ONDANSETRON HCL 4 MG/2ML IJ SOLN: 4.0000 mg | Freq: Four times a day (QID) | INTRAMUSCULAR | Status: DC | PRN

## 2019-01-06 MED ORDER — HEPARIN (PORCINE) IN NACL 1000-0.9 UT/500ML-% IV SOLN
INTRAVENOUS | Status: AC
  Filled 2019-01-06: qty 500

## 2019-01-06 MED ORDER — BUPIVACAINE HCL (PF) 0.25 % IJ SOLN
INTRAMUSCULAR | Status: DC | PRN
  Administered 2019-01-06: 50 mL

## 2019-01-06 MED ORDER — HEPARIN (PORCINE) IN NACL 1000-0.9 UT/500ML-% IV SOLN
INTRAVENOUS | Status: DC | PRN
  Administered 2019-01-06: 500 mL

## 2019-01-06 MED ORDER — SODIUM CHLORIDE 0.9 % IV SOLN
INTRAVENOUS | Status: DC
  Administered 2019-01-06: 09:00:00 via INTRAVENOUS

## 2019-01-06 MED ORDER — SODIUM CHLORIDE 0.9% FLUSH: 3.0000 mL | Freq: Two times a day (BID) | INTRAVENOUS | Status: DC

## 2019-01-06 SURGICAL SUPPLY — 11 items
BAG SNAP BAND KOVER 36X36 (MISCELLANEOUS) ×2 IMPLANT
CATH HEX JOSEPH 2-5-2 65CM 6F (CATHETERS) ×2 IMPLANT
CATH JOSEPHSON QUAD-ALLRED 6FR (CATHETERS) ×4 IMPLANT
PACK EP LATEX FREE (CUSTOM PROCEDURE TRAY) ×2
PACK EP LF (CUSTOM PROCEDURE TRAY) ×1 IMPLANT
PAD PRO RADIOLUCENT 2001M-C (PAD) ×3 IMPLANT
PATCH CARTO3 (PAD) ×2 IMPLANT
SHEATH PINNACLE 6F 10CM (SHEATH) ×4 IMPLANT
SHEATH PINNACLE 7F 10CM (SHEATH) ×2 IMPLANT
SHEATH PINNACLE 8F 10CM (SHEATH) ×2 IMPLANT
SHIELD RADPAD SCOOP 12X17 (MISCELLANEOUS) ×2 IMPLANT

## 2019-01-06 NOTE — Progress Notes (Signed)
Site Area: R femoral venous Site Prior to Removal: Level 0 Pressure Applied For: 13 minutes Manual: YES Patient Status During Pull: Stable Post Pull Site: Level 0 Post Pull Instructions Given: YES Post Pull Pulses Present: R DP palpable: +2 Dressing Applied: Sterile 4x4 gauze and tegaderm Bedrest begins @ 1212 Comments:  Removed by Verlin Fester

## 2019-01-06 NOTE — Progress Notes (Signed)
Dr Lovena Le in and ok to ambulate at 1730 and discharge home; client up and walked and tolerated well; right groin stable, no bleeding or hematoma

## 2019-01-06 NOTE — Interval H&P Note (Signed)
History and Physical Interval Note:  01/06/2019 8:25 AM  Emily Calhoun  has presented today for surgery, with the diagnosis of SVT.  The various methods of treatment have been discussed with the patient and family. After consideration of risks, benefits and other options for treatment, the patient has consented to  Procedure(s): SVT ABLATION (N/A) as a surgical intervention.  The patient's history has been reviewed, patient examined, no change in status, stable for surgery.  I have reviewed the patient's chart and labs.  Questions were answered to the patient's satisfaction.     Cristopher Peru

## 2019-01-06 NOTE — Discharge Instructions (Signed)
Femoral Site Care °This sheet gives you information about how to care for yourself after your procedure. Your health care provider may also give you more specific instructions. If you have problems or questions, contact your health care provider. °What can I expect after the procedure? °After the procedure, it is common to have: °· Bruising that usually fades within 1-2 weeks. °· Tenderness at the site. °Follow these instructions at home: °Wound care °· Follow instructions from your health care provider about how to take care of your insertion site. Make sure you: °? Wash your hands with soap and water before you change your bandage (dressing). If soap and water are not available, use hand sanitizer. °? Change your dressing as told by your health care provider. °? Leave stitches (sutures), skin glue, or adhesive strips in place. These skin closures may need to stay in place for 2 weeks or longer. If adhesive strip edges start to loosen and curl up, you may trim the loose edges. Do not remove adhesive strips completely unless your health care provider tells you to do that. °· Do not take baths, swim, or use a hot tub until your health care provider approves. °· You may shower 24-48 hours after the procedure or as told by your health care provider. °? Gently wash the site with plain soap and water. °? Pat the area dry with a clean towel. °? Do not rub the site. This may cause bleeding. °· Do not apply powder or lotion to the site. Keep the site clean and dry. °· Check your femoral site every day for signs of infection. Check for: °? Redness, swelling, or pain. °? Fluid or blood. °? Warmth. °? Pus or a bad smell. °Activity °· For the first 2-3 days after your procedure, or as long as directed: °? Avoid climbing stairs as much as possible. °? Do not squat. °· Do not lift anything that is heavier than 10 lb (4.5 kg), or the limit that you are told, until your health care provider says that it is safe. °· Rest as  directed. °? Avoid sitting for a long time without moving. Get up to take short walks every 1-2 hours. °· Do not drive for 24 hours if you were given a medicine to help you relax (sedative). °General instructions °· Take over-the-counter and prescription medicines only as told by your health care provider. °· Keep all follow-up visits as told by your health care provider. This is important. °Contact a health care provider if you have: °· A fever or chills. °· You have redness, swelling, or pain around your insertion site. °Get help right away if: °· The catheter insertion area swells very fast. °· You pass out. °· You suddenly start to sweat or your skin gets clammy. °· The catheter insertion area is bleeding, and the bleeding does not stop when you hold steady pressure on the area. °· The area near or just beyond the catheter insertion site becomes pale, cool, tingly, or numb. °These symptoms may represent a serious problem that is an emergency. Do not wait to see if the symptoms will go away. Get medical help right away. Call your local emergency services (911 in the [place] Do not drive yourself to the hospital. °Summary °· After the procedure, it is common to have bruising that usually fades within 1-2 weeks. °· Check your femoral site every day for signs of infection. °· Do not lift anything that is heavier than 10 lb (4.5 kg), or the   limit that you are told, until your health care provider says that it is safe. °This information is not intended to replace advice given to you by your health care provider. Make sure you discuss any questions you have with your health care provider. °Document Released: 03/06/2014 Document Revised: 07/16/2017 Document Reviewed: 07/16/2017 °Elsevier Interactive Patient Education © 2019 Elsevier Inc. ° °Moderate Conscious Sedation, Adult, Care After °These instructions provide you with information about caring for yourself after your procedure. Your health care provider may also give  you more specific instructions. Your treatment has been planned according to current medical practices, but problems sometimes occur. Call your health care provider if you have any problems or questions after your procedure. °What can I expect after the procedure? °After your procedure, it is common: °· To feel sleepy for several hours. °· To feel clumsy and have poor balance for several hours. °· To have poor judgment for several hours. °· To vomit if you eat too soon. °Follow these instructions at home: °For at least 24 hours after the procedure: ° °· Do not: °? Participate in activities where you could fall or become injured. °? Drive. °? Use heavy machinery. °? Drink alcohol. °? Take sleeping pills or medicines that cause drowsiness. °? Make important decisions or sign legal documents. °? Take care of children on your own. °· Rest. °Eating and drinking °· Follow the diet recommended by your health care provider. °· If you vomit: °? Drink water, juice, or soup when you can drink without vomiting. °? Make sure you have little or no nausea before eating solid foods. °General instructions °· Have a responsible adult stay with you until you are awake and alert. °· Take over-the-counter and prescription medicines only as told by your health care provider. °· If you smoke, do not smoke without supervision. °· Keep all follow-up visits as told by your health care provider. This is important. °Contact a health care provider if: °· You keep feeling nauseous or you keep vomiting. °· You feel light-headed. °· You develop a rash. °· You have a fever. °Get help right away if: °· You have trouble breathing. °This information is not intended to replace advice given to you by your health care provider. Make sure you discuss any questions you have with your health care provider. °Document Released: 04/23/2013 Document Revised: 12/06/2015 Document Reviewed: 10/23/2015 °Elsevier Interactive Patient Education © 2019 Elsevier  Inc. ° °

## 2019-01-06 NOTE — Progress Notes (Addendum)
Site Area: R Internal Jugular Vein Site Prior to Removal: Level 0 Pressure Applied For:  13 minutes Manual: YES Patient Status During Pull: Stable Post Pull Site: Level 0 Post Pull Instructions Given: YES Post Pull Pulses Present: N/A Dressing Applied: Sterile gauze and tegaderm Comments:  Removed by Olean Ree

## 2019-01-07 ENCOUNTER — Encounter (HOSPITAL_COMMUNITY): Payer: Self-pay | Admitting: Internal Medicine

## 2019-01-07 MED FILL — Fentanyl Citrate Preservative Free (PF) Inj 100 MCG/2ML: INTRAMUSCULAR | Qty: 2 | Status: AC

## 2019-01-08 MED FILL — ESCITALOPRAM 20 MG TABLET: 20 | 90 days supply | Qty: 90 | Fill #0

## 2019-01-08 MED FILL — DOTTI 0.05 MG/24HR PTTW: 0.05 | 84 days supply | Qty: 24 | Fill #3

## 2019-01-16 ENCOUNTER — Telehealth: Payer: Self-pay | Admitting: Internal Medicine

## 2019-01-16 NOTE — Telephone Encounter (Signed)
Returned call to Pt.  Pt calling to report she had another episode (states she was advised by Dr. Lovena Le to call and report episodes).  States she started to feel overheated.  She noticed by her watch that her pulse was in the 160's.  Felt like her blood drained down into her legs-that sensation made her feel like she might have diarrhea.  She also states she felt nauseous.    She states she took a cold shower and sat in her recliner and propped up her legs until the sensation had passed.  She states now she just feels drained.  Pt denies any c/o chest pain.  Advised Pt she did the right thing.  Offered encouragement.  Pt has follow up appt with Dr. Lovena Le on 01/20/2019.  Pt also describes episode a few nights ago when she woke up with her heart racing.  Advised Pt to write down all her symptoms and episodes and have that available to discuss with Dr. Lovena Le on Monday.  Pt indicates understanding.  Pt was reassured.

## 2019-01-16 NOTE — Telephone Encounter (Signed)
New message   Pt c/o Syncope: STAT if syncope occurred within 30 minutes and pt complains of lightheadedness High Priority if episode of passing out, completely, today or in last 24 hours   1. Did you pass out today? No, but had an episode about 45 minutes ago    2. When is the last time you passed out? Last summer   3. Has this occurred multiple times? Yes   4. Did you have any symptoms prior to passing out? Nausea, diarrhea, heart racing, chest pain

## 2019-01-17 ENCOUNTER — Other Ambulatory Visit: Payer: Self-pay | Admitting: Interventional Cardiology

## 2019-01-20 ENCOUNTER — Other Ambulatory Visit: Payer: Self-pay

## 2019-01-20 ENCOUNTER — Encounter: Payer: Self-pay | Admitting: Internal Medicine

## 2019-01-20 ENCOUNTER — Telehealth: Payer: Self-pay | Admitting: Internal Medicine

## 2019-01-20 ENCOUNTER — Ambulatory Visit (INDEPENDENT_AMBULATORY_CARE_PROVIDER_SITE_OTHER): Payer: 59 | Admitting: Internal Medicine

## 2019-01-20 DIAGNOSIS — I495 Sick sinus syndrome: Secondary | ICD-10-CM | POA: Diagnosis not present

## 2019-01-20 MED FILL — METOPROLOL TARTRATE 25 MG T: 25 | 90 days supply | Qty: 180 | Fill #0

## 2019-01-20 NOTE — Progress Notes (Signed)
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 She underwent EP study a couple of weeks ago and had no inducbile arrhythmias. She did have very sluggish AV conduction. Since then she has had an episode of near syncope. She describes a fairly classic symptoms of a vagal spell where she laid down to avoid passing out after experiencing severe lower abdominal pain. The patient has an apple watch but does not have any ECG's to show me. She denies chest pain or sob. No excess ETOH or caffeine.  Allergies  Allergen Reactions  . Azithromycin Anaphylaxis  . Cephalexin Anaphylaxis  . Cephalosporins Anaphylaxis  . Clarithromycin Anaphylaxis  . Gentamicin Sulfate Anaphylaxis  . Ketamine Anaphylaxis  . Lifitegrast Other (See Comments)    "causes eye infection"  . Metronidazole Anaphylaxis  . Minocin [Minocycline Hcl] Anaphylaxis  . Morphine Sulfate Anaphylaxis  . Penicillins Anaphylaxis    DID THE REACTION INVOLVE: Swelling of the face/tongue/throat, SOB, or low BP? Y Sudden or severe rash/hives, skin peeling, or the inside of the mouth or nose? Y Did it require medical treatment? Y When did it last happen?Childhood Allergy If all above answers are "NO", may proceed with cephalosporin use.   . Sulfa Antibiotics Anaphylaxis  . Sulfonamide Derivatives Anaphylaxis  . Effexor [Venlafaxine] Other (See Comments)    hallucinations  . Iodinated Diagnostic Agents Itching    01-09-17 had 13 hour premed, and still had hives. She was given 50 of Benadryl and observed for 30 minutes.   Marland Kitchen Ketoconazole     Unknown reaction   . Macrobid [Nitrofurantoin]     Unknown reaction  . Singulair [Montelukast Sodium]     Nervousness and depression per pt  . Tobramycin-Dexamethasone Itching  . Wellbutrin [Bupropion]     Hallucinations      Current Outpatient Medications  Medication Sig Dispense Refill  . acyclovir (ZOVIRAX) 400 MG tablet Take 1 tablet (400 mg total)  by mouth daily. 90 tablet 4  . Azelaic Acid 15 % cream Apply 1 application topically 2 (two) times a day.    . clobetasol (TEMOVATE) 0.05 % external solution Apply 1 application topically 2 (two) times daily as needed (itching).     . clonazePAM (KLONOPIN) 1 MG tablet Take 1 mg by mouth at bedtime as needed (sleep).     Marland Kitchen dicyclomine (BENTYL) 20 MG tablet Take 1 tablet (20 mg total) by mouth 2 (two) times daily. 20 tablet 0  . diphenhydrAMINE (BENADRYL) 25 MG tablet Take 25-50 mg by mouth daily as needed for itching or allergies.    Marland Kitchen escitalopram (LEXAPRO) 20 MG tablet Take 20 mg by mouth daily.   3  . esomeprazole (NEXIUM) 40 MG capsule Take 1 capsule (40 mg total) by mouth daily. 30 capsule 3  . estradiol (VIVELLE-DOT) 0.05 MG/24HR patch Place 1 patch onto the skin 2 (two) times a week.    . fluticasone (FLONASE) 50 MCG/ACT nasal spray Place 2 sprays into both nostrils daily as needed for allergies.     Marland Kitchen gabapentin (NEURONTIN) 300 MG capsule Take 300 mg by mouth See admin instructions. Take 300 mg by mouth twice daily, may take a third 300 mg dose as needed for pair  2  . levothyroxine (SYNTHROID, LEVOTHROID) 75 MCG tablet Take 75 mcg by mouth daily before breakfast.    . metoprolol tartrate (LOPRESSOR) 25 MG tablet TAKE 1 TABLET BY MOUTH 2 TIMES DAILY. 180 tablet 2  .  SUPER B COMPLEX/C PO Take 1 tablet by mouth daily.     Current Facility-Administered Medications  Medication Dose Route Frequency Provider Last Rate Last Dose  . 0.9 %  sodium chloride infusion  500 mL Intravenous Once Pyrtle, Lajuan Lines, MD         Past Medical History:  Diagnosis Date  . Anal fissure   . Anxiety   . Arthritis   . Asthma   . Asthma   . Autoimmune disease ([place])   . Cervical cancer [facility])    mom took DES  . Concussion    2 months- saw neurologist and everything was fine  . Depression   . GERD (gastroesophageal reflux disease)   . H/O transfusion of packed red blood cells   . Heart murmur   . Hepatitis  A   . History of kidney stones   . Hyperlipidemia   . 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 IBS (irritable bowel syndrome)   . Migraine   . Migraine   . Seasonal allergies   . STD (sexually transmitted disease)    HSV II  . Sweet's disease   . Thyroid disease    hypothyroid    ROS:   All systems reviewed and negative except as noted in the HPI.   Past Surgical History:  Procedure Laterality Date  . ABDOMINAL HYSTERECTOMY  1988   ?retained ovaries for cx ca  . blephoplasty    . BREAST LUMPECTOMY Left 2009   benign; ?cyst  . BREAST REDUCTION SURGERY Bilateral 09/2010  . COLONOSCOPY    . ELBOW SURGERY Right 1995   tendon repair  . ELBOW SURGERY Right   . EXCISION VAGINAL CYST  10-25-2009   benign squamous cyst  . FOOT SURGERY Right   . internal hemorrhoidectomy    . NASAL SINUS SURGERY     x 2  . SVT ABLATION N/A 01/06/2019   Procedure: SVT ABLATION;  Surgeon: Evans Lance, MD;  Location: [facility];  Service: Cardiovascular;  Laterality: N/A;  . TONSILLECTOMY    . UPPER GASTROINTESTINAL ENDOSCOPY       Family History  Problem Relation Age of Onset  . Allergies Mother   . Heart disease Mother   . Heart disease Father   . Clotting disorder Father        per pt had clot in is intestines  . Diabetes Father   . Thyroid disease Father   . Diabetes Sister   . Thyroid disease Sister   . Other Son        Hypogammaglobulinemia  . Colon cancer Neg Hx   . Esophageal cancer Neg Hx   . Stomach cancer Neg Hx   . Pancreatic cancer Neg Hx   . Liver disease Neg Hx      Social History   Socioeconomic History  . Marital status: Married    Spouse name: Not on file  . Number of children: 2  . Years of education: Not on file  . Highest education level: Not on file  Occupational History  . Occupation: Merchandiser, retail: UNEMPLOYED  Social Needs  . Financial resource strain: Not on file  . Food insecurity    Worry: Not on file    Inability:  Not on file  . Transportation needs    Medical: Not on file    Non-medical: Not on file  Tobacco Use  . Smoking status: Never Smoker  . Smokeless tobacco: Never  Used  Substance and Sexual Activity  . Alcohol use: Yes    Alcohol/week: 8.0 standard drinks    Types: 8 Standard drinks or equivalent per week    Comment: 2 glasses wine nightly.    . Drug use: No  . Sexual activity: Yes    Partners: Male    Birth control/protection: Surgical    Comment: TAH  Lifestyle  . Physical activity    Days per week: Not on file    Minutes per session: Not on file  . Stress: Not on file  Relationships  . Social Herbalist on phone: Not on file    Gets together: Not on file    Attends religious service: Not on file    Active member of club or organization: Not on file    Attends meetings of clubs or organizations: Not on file    Relationship status: Not on file  . Intimate partner violence    Fear of current or ex partner: Not on file    Emotionally abused: Not on file    Physically abused: Not on file    Forced sexual activity: Not on file  Other Topics Concern  . Not on file  Social History Narrative   Lives with husband in a 2 story home.  Has 2 children.     Works as an Futures trader.    Education: college.     BP 120/78   Pulse 71   Ht 5\' 2"  (1.575 m)   Wt 153 lb 9.6 oz (69.7 kg)   LMP 07/17/1986   SpO2 98%   BMI 28.09 kg/m   Physical Exam:  Well appearing 63 yo woman, NAD HEENT: Unremarkable Neck:  No JVD, no thyromegally Lymphatics:  No adenopathy Back:  No CVA tenderness Lungs:  Clear with no wheezes HEART:  Regular rate rhythm, no murmurs, no rubs, no clicks Abd:  soft, positive bowel sounds, no organomegally, no rebound, no guarding Ext:  2 plus pulses, no edema, no cyanosis, no clubbing Skin:  No rashes no nodules Neuro:  CN II through XII intact, motor grossly intact  EKG - nsr   Assess/Plan: 1. Syncope - her history is fairly typical for  a neurally mediated etiology. I have recommended she hold her beta blocker for now as she has poor AV conduction.  2. Wide QRS tachy - she was non-inducible at EP study. She will undergo watchful waiting. I discussed placement of an ILR.  3. HTN - her blood pressure is good today. I would like for her pressure to run higher in hopes of trying to prevent neurally mediated syncope.   Mikle Bosworth.D.

## 2019-01-20 NOTE — Patient Instructions (Signed)
Medication Instructions:  Your physician recommends that you continue on your current medications as directed. Please refer to the Current Medication list given to you today.  Labwork: None ordered.  Testing/Procedures: None ordered.  Follow-Up: Your physician wants you to follow-up in: 5-6 weeks with Dr. Lovena Le.     Any Other Special Instructions Will Be Listed Below (If Applicable).  If you need a refill on your cardiac medications before your next appointment, please call your pharmacy.

## 2019-01-20 NOTE — Telephone Encounter (Signed)

## 2019-01-22 MED FILL — GABAPENTIN 300 MG CAPSULE: 300 | 90 days supply | Qty: 180 | Fill #1

## 2019-01-30 DIAGNOSIS — R55 Syncope and collapse: Secondary | ICD-10-CM | POA: Diagnosis not present

## 2019-01-30 DIAGNOSIS — I499 Cardiac arrhythmia, unspecified: Secondary | ICD-10-CM | POA: Diagnosis not present

## 2019-01-30 DIAGNOSIS — R002 Palpitations: Secondary | ICD-10-CM | POA: Diagnosis not present

## 2019-02-07 ENCOUNTER — Telehealth: Payer: 59 | Admitting: Internal Medicine

## 2019-02-19 DIAGNOSIS — L710 Perioral dermatitis: Secondary | ICD-10-CM | POA: Diagnosis not present

## 2019-02-19 DIAGNOSIS — L219 Seborrheic dermatitis, unspecified: Secondary | ICD-10-CM | POA: Diagnosis not present

## 2019-02-19 DIAGNOSIS — Z5181 Encounter for therapeutic drug level monitoring: Secondary | ICD-10-CM | POA: Diagnosis not present

## 2019-02-19 DIAGNOSIS — L501 Idiopathic urticaria: Secondary | ICD-10-CM | POA: Diagnosis not present

## 2019-02-19 DIAGNOSIS — T380X5A Adverse effect of glucocorticoids and synthetic analogues, initial encounter: Secondary | ICD-10-CM | POA: Diagnosis not present

## 2019-02-19 MED FILL — CLOBETASOL 0.05% SOLUTION: 0.05 | 30 days supply | Qty: 50 | Fill #0

## 2019-02-19 MED FILL — LEVOCETIRIZINE 5 MG TABLET: 5 | 90 days supply | Qty: 90 | Fill #0

## 2019-02-19 MED FILL — DOXYCYCLINE HYC 20 MG TAB: 20 | 90 days supply | Qty: 180 | Fill #0

## 2019-02-19 MED FILL — KETOCONAZOLE 2 % SHAM: 2 | 30 days supply | Qty: 120 | Fill #0

## 2019-02-20 MED FILL — LEVOTHYROXINE 75 MCG TABLET: 75 | 90 days supply | Qty: 90 | Fill #0

## 2019-03-07 ENCOUNTER — Ambulatory Visit: Payer: 59 | Admitting: Internal Medicine

## 2019-03-07 DIAGNOSIS — R110 Nausea: Secondary | ICD-10-CM | POA: Diagnosis not present

## 2019-03-07 DIAGNOSIS — R002 Palpitations: Secondary | ICD-10-CM | POA: Diagnosis not present

## 2019-03-07 DIAGNOSIS — R55 Syncope and collapse: Secondary | ICD-10-CM | POA: Diagnosis not present

## 2019-03-07 DIAGNOSIS — L749 Eccrine sweat disorder, unspecified: Secondary | ICD-10-CM | POA: Diagnosis not present

## 2019-03-07 DIAGNOSIS — I499 Cardiac arrhythmia, unspecified: Secondary | ICD-10-CM | POA: Diagnosis not present

## 2019-03-11 MED FILL — DOXYCYCLINE HYC 100 MG CAPS: 100 | 7 days supply | Qty: 14 | Fill #0

## 2019-03-11 MED FILL — CHLORHEXIDINE 0.12% RINSE: 0.12 | 15 days supply | Qty: 473 | Fill #0

## 2019-03-12 MED FILL — clonazePAM 1 MG TABS: 1 | 30 days supply | Qty: 30 | Fill #0

## 2019-03-13 DIAGNOSIS — M50323 Other cervical disc degeneration at C6-C7 level: Secondary | ICD-10-CM | POA: Diagnosis not present

## 2019-03-13 DIAGNOSIS — M542 Cervicalgia: Secondary | ICD-10-CM | POA: Diagnosis not present

## 2019-03-13 DIAGNOSIS — M9901 Segmental and somatic dysfunction of cervical region: Secondary | ICD-10-CM | POA: Diagnosis not present

## 2019-03-17 DIAGNOSIS — M50323 Other cervical disc degeneration at C6-C7 level: Secondary | ICD-10-CM | POA: Diagnosis not present

## 2019-03-17 DIAGNOSIS — M9901 Segmental and somatic dysfunction of cervical region: Secondary | ICD-10-CM | POA: Diagnosis not present

## 2019-03-17 DIAGNOSIS — M542 Cervicalgia: Secondary | ICD-10-CM | POA: Diagnosis not present

## 2019-03-19 DIAGNOSIS — L299 Pruritus, unspecified: Secondary | ICD-10-CM | POA: Diagnosis not present

## 2019-03-27 DIAGNOSIS — L299 Pruritus, unspecified: Secondary | ICD-10-CM | POA: Diagnosis not present

## 2019-03-27 DIAGNOSIS — M4312 Spondylolisthesis, cervical region: Secondary | ICD-10-CM | POA: Diagnosis not present

## 2019-03-27 DIAGNOSIS — R55 Syncope and collapse: Secondary | ICD-10-CM | POA: Diagnosis not present

## 2019-03-27 DIAGNOSIS — M50322 Other cervical disc degeneration at C5-C6 level: Secondary | ICD-10-CM | POA: Diagnosis not present

## 2019-03-27 DIAGNOSIS — G909 Disorder of the autonomic nervous system, unspecified: Secondary | ICD-10-CM | POA: Diagnosis not present

## 2019-04-02 MED FILL — ACYCLOVIR 400 MG TABLET: 400 | 90 days supply | Qty: 90 | Fill #0

## 2019-04-02 MED FILL — ESOMEPRAZOLE MAG DR 40 MG C: 40 | 90 days supply | Qty: 90 | Fill #3

## 2019-04-02 MED FILL — CLOBETASOL 0.05% SOLUTION: 0.05 | 25 days supply | Qty: 50 | Fill #0

## 2019-04-07 MED FILL — DOTTI 0.05 MG/24HR PTTW: 0.05 | 84 days supply | Qty: 24 | Fill #0

## 2019-04-10 MED FILL — KETOCONAZOLE 2 % SHAM: 2 | 30 days supply | Qty: 120 | Fill #1

## 2019-04-14 MED FILL — clonazePAM 1 MG TABS: 1 | 30 days supply | Qty: 30 | Fill #0

## 2019-04-22 MED FILL — GABAPENTIN 300 MG CAPSULE: 300 | 90 days supply | Qty: 180 | Fill #2

## 2019-04-30 DIAGNOSIS — M4802 Spinal stenosis, cervical region: Secondary | ICD-10-CM | POA: Diagnosis not present

## 2019-04-30 DIAGNOSIS — M5416 Radiculopathy, lumbar region: Secondary | ICD-10-CM | POA: Diagnosis not present

## 2019-04-30 DIAGNOSIS — M4807 Spinal stenosis, lumbosacral region: Secondary | ICD-10-CM | POA: Diagnosis not present

## 2019-04-30 DIAGNOSIS — M5412 Radiculopathy, cervical region: Secondary | ICD-10-CM | POA: Diagnosis not present

## 2019-05-19 DIAGNOSIS — M5116 Intervertebral disc disorders with radiculopathy, lumbar region: Secondary | ICD-10-CM | POA: Diagnosis not present

## 2019-05-19 DIAGNOSIS — M47816 Spondylosis without myelopathy or radiculopathy, lumbar region: Secondary | ICD-10-CM | POA: Diagnosis not present

## 2019-05-19 DIAGNOSIS — M4726 Other spondylosis with radiculopathy, lumbar region: Secondary | ICD-10-CM | POA: Diagnosis not present

## 2019-05-20 DIAGNOSIS — L982 Febrile neutrophilic dermatosis [Sweet]: Secondary | ICD-10-CM | POA: Diagnosis not present

## 2019-05-20 DIAGNOSIS — Z79899 Other long term (current) drug therapy: Secondary | ICD-10-CM | POA: Diagnosis not present

## 2019-05-20 DIAGNOSIS — E785 Hyperlipidemia, unspecified: Secondary | ICD-10-CM | POA: Diagnosis not present

## 2019-05-20 DIAGNOSIS — E039 Hypothyroidism, unspecified: Secondary | ICD-10-CM | POA: Diagnosis not present

## 2019-05-20 DIAGNOSIS — Z23 Encounter for immunization: Secondary | ICD-10-CM | POA: Diagnosis not present

## 2019-05-20 DIAGNOSIS — D803 Selective deficiency of immunoglobulin G [IgG] subclasses: Secondary | ICD-10-CM | POA: Diagnosis not present

## 2019-05-20 DIAGNOSIS — M4807 Spinal stenosis, lumbosacral region: Secondary | ICD-10-CM | POA: Diagnosis not present

## 2019-05-20 DIAGNOSIS — R7301 Impaired fasting glucose: Secondary | ICD-10-CM | POA: Diagnosis not present

## 2019-05-20 DIAGNOSIS — K625 Hemorrhage of anus and rectum: Secondary | ICD-10-CM | POA: Diagnosis not present

## 2019-05-20 DIAGNOSIS — D804 Selective deficiency of immunoglobulin M [IgM]: Secondary | ICD-10-CM | POA: Diagnosis not present

## 2019-05-20 DIAGNOSIS — E559 Vitamin D deficiency, unspecified: Secondary | ICD-10-CM | POA: Diagnosis not present

## 2019-05-26 MED FILL — LEVOTHYROXINE 75 MCG TABLET: 75 | 90 days supply | Qty: 90 | Fill #1

## 2019-05-26 MED FILL — ESCITALOPRAM 20 MG TABLET: 20 | 90 days supply | Qty: 90 | Fill #1

## 2019-06-04 DIAGNOSIS — D802 Selective deficiency of immunoglobulin A [IgA]: Secondary | ICD-10-CM | POA: Diagnosis not present

## 2019-06-04 DIAGNOSIS — L982 Febrile neutrophilic dermatosis [Sweet]: Secondary | ICD-10-CM | POA: Diagnosis not present

## 2019-06-04 DIAGNOSIS — E559 Vitamin D deficiency, unspecified: Secondary | ICD-10-CM | POA: Diagnosis not present

## 2019-06-04 DIAGNOSIS — K219 Gastro-esophageal reflux disease without esophagitis: Secondary | ICD-10-CM | POA: Diagnosis not present

## 2019-06-09 DIAGNOSIS — D802 Selective deficiency of immunoglobulin A [IgA]: Secondary | ICD-10-CM | POA: Diagnosis not present

## 2019-06-09 DIAGNOSIS — E782 Mixed hyperlipidemia: Secondary | ICD-10-CM | POA: Diagnosis not present

## 2019-06-09 DIAGNOSIS — E039 Hypothyroidism, unspecified: Secondary | ICD-10-CM | POA: Diagnosis not present

## 2019-06-14 DIAGNOSIS — Z20828 Contact with and (suspected) exposure to other viral communicable diseases: Secondary | ICD-10-CM | POA: Diagnosis not present

## 2019-06-25 DIAGNOSIS — D802 Selective deficiency of immunoglobulin A [IgA]: Secondary | ICD-10-CM | POA: Diagnosis not present

## 2019-06-25 DIAGNOSIS — D806 Antibody deficiency with near-normal immunoglobulins or with hyperimmunoglobulinemia: Secondary | ICD-10-CM | POA: Diagnosis not present

## 2019-06-30 DIAGNOSIS — M4310 Spondylolisthesis, site unspecified: Secondary | ICD-10-CM | POA: Diagnosis not present

## 2019-06-30 DIAGNOSIS — M5416 Radiculopathy, lumbar region: Secondary | ICD-10-CM | POA: Diagnosis not present

## 2019-06-30 DIAGNOSIS — M47812 Spondylosis without myelopathy or radiculopathy, cervical region: Secondary | ICD-10-CM | POA: Diagnosis not present

## 2019-07-14 DIAGNOSIS — D802 Selective deficiency of immunoglobulin A [IgA]: Secondary | ICD-10-CM | POA: Diagnosis not present

## 2019-07-14 DIAGNOSIS — M5412 Radiculopathy, cervical region: Secondary | ICD-10-CM | POA: Diagnosis not present

## 2019-07-14 DIAGNOSIS — E559 Vitamin D deficiency, unspecified: Secondary | ICD-10-CM | POA: Diagnosis not present

## 2019-07-14 DIAGNOSIS — H905 Unspecified sensorineural hearing loss: Secondary | ICD-10-CM | POA: Diagnosis not present

## 2019-07-14 MED FILL — ESOMEPRAZOLE MAG DR 40 MG C: 40 | 90 days supply | Qty: 90 | Fill #0

## 2019-07-14 MED FILL — ROSUVASTATIN CALCIUM 10 MG: 10 | 90 days supply | Qty: 90 | Fill #0

## 2019-07-14 MED FILL — ACYCLOVIR 400 MG TABLET: 400 | 90 days supply | Qty: 90 | Fill #0

## 2019-07-14 MED FILL — ESTRADIOL 0.05 MG/24HR PTTW: 0.05 | 84 days supply | Qty: 24 | Fill #0

## 2019-07-15 MED FILL — GABAPENTIN 300 MG CAPSULE: 300 | 90 days supply | Qty: 180 | Fill #3

## 2019-07-16 DIAGNOSIS — H40003 Preglaucoma, unspecified, bilateral: Secondary | ICD-10-CM | POA: Diagnosis not present

## 2019-07-23 DIAGNOSIS — Z1231 Encounter for screening mammogram for malignant neoplasm of breast: Secondary | ICD-10-CM | POA: Diagnosis not present

## 2019-08-07 DIAGNOSIS — M542 Cervicalgia: Secondary | ICD-10-CM | POA: Diagnosis not present

## 2019-08-07 DIAGNOSIS — M545 Low back pain: Secondary | ICD-10-CM | POA: Diagnosis not present

## 2019-08-11 DIAGNOSIS — E782 Mixed hyperlipidemia: Secondary | ICD-10-CM | POA: Diagnosis not present

## 2019-08-11 DIAGNOSIS — F4323 Adjustment disorder with mixed anxiety and depressed mood: Secondary | ICD-10-CM | POA: Diagnosis not present

## 2019-08-11 DIAGNOSIS — E559 Vitamin D deficiency, unspecified: Secondary | ICD-10-CM | POA: Diagnosis not present

## 2019-08-11 DIAGNOSIS — E7800 Pure hypercholesterolemia, unspecified: Secondary | ICD-10-CM | POA: Diagnosis not present

## 2019-08-12 DIAGNOSIS — Z0000 Encounter for general adult medical examination without abnormal findings: Secondary | ICD-10-CM | POA: Diagnosis not present

## 2019-08-18 MED FILL — LEVOTHYROXINE SODIUM 75 MCG: 75 | 30 days supply | Qty: 30 | Fill #0

## 2019-08-18 MED FILL — ESCITALOPRAM 20 MG TABLET: 20 | 30 days supply | Qty: 30 | Fill #0

## 2019-08-19 MED FILL — clonazePAM 1 MG TABS: 1 | 30 days supply | Qty: 30 | Fill #0

## 2019-08-31 DIAGNOSIS — R55 Syncope and collapse: Secondary | ICD-10-CM | POA: Diagnosis not present

## 2019-09-02 DIAGNOSIS — R55 Syncope and collapse: Secondary | ICD-10-CM | POA: Diagnosis not present

## 2019-09-02 DIAGNOSIS — F4323 Adjustment disorder with mixed anxiety and depressed mood: Secondary | ICD-10-CM | POA: Diagnosis not present

## 2019-09-02 DIAGNOSIS — N3941 Urge incontinence: Secondary | ICD-10-CM | POA: Diagnosis not present

## 2019-09-02 DIAGNOSIS — N952 Postmenopausal atrophic vaginitis: Secondary | ICD-10-CM | POA: Diagnosis not present

## 2019-09-02 DIAGNOSIS — N941 Unspecified dyspareunia: Secondary | ICD-10-CM | POA: Diagnosis not present

## 2019-09-02 MED FILL — ESTRADIOL 0.1 MG/GM CRM: 0.1 | 90 days supply | Qty: 43 | Fill #0

## 2019-09-02 MED FILL — MYRBETRIQ ER 25 MG TABLET: 25 | 30 days supply | Qty: 30 | Fill #0

## 2019-09-08 DIAGNOSIS — Z1159 Encounter for screening for other viral diseases: Secondary | ICD-10-CM | POA: Diagnosis not present

## 2019-09-08 DIAGNOSIS — J019 Acute sinusitis, unspecified: Secondary | ICD-10-CM | POA: Diagnosis not present

## 2019-09-08 DIAGNOSIS — K047 Periapical abscess without sinus: Secondary | ICD-10-CM | POA: Diagnosis not present

## 2019-09-09 MED FILL — CLINDAMYCIN HCL 150 MG CAPS: 150 | 7 days supply | Qty: 21 | Fill #0

## 2019-09-11 MED FILL — LEVOTHYROXINE SODIUM 75 MCG: 75 | 90 days supply | Qty: 90 | Fill #1

## 2019-09-12 DIAGNOSIS — J019 Acute sinusitis, unspecified: Secondary | ICD-10-CM | POA: Diagnosis not present

## 2019-09-12 DIAGNOSIS — J3489 Other specified disorders of nose and nasal sinuses: Secondary | ICD-10-CM | POA: Diagnosis not present

## 2019-09-12 DIAGNOSIS — R519 Headache, unspecified: Secondary | ICD-10-CM | POA: Diagnosis not present

## 2019-09-12 MED FILL — AZELAIC ACID 15 % GEL: 15 | 30 days supply | Qty: 50 | Fill #0

## 2019-09-15 MED FILL — ESCITALOPRAM 20 MG TABLET: 20 | 30 days supply | Qty: 30 | Fill #1

## 2019-09-16 DIAGNOSIS — F4323 Adjustment disorder with mixed anxiety and depressed mood: Secondary | ICD-10-CM | POA: Diagnosis not present

## 2019-09-17 DIAGNOSIS — R519 Headache, unspecified: Secondary | ICD-10-CM | POA: Diagnosis not present

## 2019-09-22 DIAGNOSIS — R55 Syncope and collapse: Secondary | ICD-10-CM | POA: Diagnosis not present

## 2019-09-26 DIAGNOSIS — R55 Syncope and collapse: Secondary | ICD-10-CM | POA: Diagnosis not present

## 2019-09-30 DIAGNOSIS — R55 Syncope and collapse: Secondary | ICD-10-CM | POA: Diagnosis not present

## 2019-10-02 DIAGNOSIS — I5022 Chronic systolic (congestive) heart failure: Secondary | ICD-10-CM | POA: Diagnosis not present

## 2019-10-02 DIAGNOSIS — I447 Left bundle-branch block, unspecified: Secondary | ICD-10-CM | POA: Diagnosis not present

## 2019-10-02 DIAGNOSIS — R000 Tachycardia, unspecified: Secondary | ICD-10-CM | POA: Diagnosis not present

## 2019-10-02 DIAGNOSIS — I420 Dilated cardiomyopathy: Secondary | ICD-10-CM | POA: Diagnosis not present

## 2019-10-02 DIAGNOSIS — R55 Syncope and collapse: Secondary | ICD-10-CM | POA: Diagnosis not present

## 2019-10-02 DIAGNOSIS — E079 Disorder of thyroid, unspecified: Secondary | ICD-10-CM | POA: Diagnosis not present

## 2019-10-02 DIAGNOSIS — R0600 Dyspnea, unspecified: Secondary | ICD-10-CM | POA: Diagnosis not present

## 2019-10-02 DIAGNOSIS — R001 Bradycardia, unspecified: Secondary | ICD-10-CM | POA: Diagnosis not present

## 2019-10-15 DIAGNOSIS — H40013 Open angle with borderline findings, low risk, bilateral: Secondary | ICD-10-CM | POA: Diagnosis not present

## 2019-10-15 DIAGNOSIS — H524 Presbyopia: Secondary | ICD-10-CM | POA: Diagnosis not present

## 2019-10-16 DIAGNOSIS — R9431 Abnormal electrocardiogram [ECG] [EKG]: Secondary | ICD-10-CM | POA: Diagnosis not present

## 2019-10-16 DIAGNOSIS — R0600 Dyspnea, unspecified: Secondary | ICD-10-CM | POA: Diagnosis not present

## 2019-10-16 DIAGNOSIS — I420 Dilated cardiomyopathy: Secondary | ICD-10-CM | POA: Diagnosis not present

## 2019-10-16 DIAGNOSIS — I5022 Chronic systolic (congestive) heart failure: Secondary | ICD-10-CM | POA: Diagnosis not present

## 2019-10-20 DIAGNOSIS — I5022 Chronic systolic (congestive) heart failure: Secondary | ICD-10-CM | POA: Diagnosis not present

## 2019-10-20 DIAGNOSIS — I493 Ventricular premature depolarization: Secondary | ICD-10-CM | POA: Diagnosis not present

## 2019-10-20 DIAGNOSIS — I447 Left bundle-branch block, unspecified: Secondary | ICD-10-CM | POA: Diagnosis not present

## 2019-10-20 DIAGNOSIS — R55 Syncope and collapse: Secondary | ICD-10-CM | POA: Diagnosis not present

## 2019-10-20 DIAGNOSIS — R001 Bradycardia, unspecified: Secondary | ICD-10-CM | POA: Diagnosis not present

## 2019-10-20 DIAGNOSIS — R000 Tachycardia, unspecified: Secondary | ICD-10-CM | POA: Diagnosis not present

## 2019-10-20 DIAGNOSIS — I420 Dilated cardiomyopathy: Secondary | ICD-10-CM | POA: Diagnosis not present

## 2019-10-20 DIAGNOSIS — E079 Disorder of thyroid, unspecified: Secondary | ICD-10-CM | POA: Diagnosis not present

## 2019-10-20 DIAGNOSIS — R0600 Dyspnea, unspecified: Secondary | ICD-10-CM | POA: Diagnosis not present

## 2019-10-20 DIAGNOSIS — I4430 Unspecified atrioventricular block: Secondary | ICD-10-CM | POA: Diagnosis not present

## 2019-10-24 MED FILL — ESOMEPRAZOLE MAG DR 40 MG C: 40 | 90 days supply | Qty: 90 | Fill #1

## 2019-10-24 MED FILL — ROSUVASTATIN CALCIUM 10 MG: 10 | 90 days supply | Qty: 90 | Fill #1

## 2019-10-24 MED FILL — ACYCLOVIR 400 MG TABLET: 400 | 90 days supply | Qty: 90 | Fill #1

## 2019-10-24 MED FILL — ESCITALOPRAM 20 MG TABLET: 20 | 30 days supply | Qty: 30 | Fill #2

## 2019-11-06 DIAGNOSIS — I25119 Atherosclerotic heart disease of native coronary artery with unspecified angina pectoris: Secondary | ICD-10-CM | POA: Diagnosis not present

## 2019-11-06 DIAGNOSIS — I5022 Chronic systolic (congestive) heart failure: Secondary | ICD-10-CM | POA: Diagnosis not present

## 2019-11-06 DIAGNOSIS — I420 Dilated cardiomyopathy: Secondary | ICD-10-CM | POA: Diagnosis not present

## 2019-11-06 DIAGNOSIS — F4323 Adjustment disorder with mixed anxiety and depressed mood: Secondary | ICD-10-CM | POA: Diagnosis not present

## 2019-11-06 DIAGNOSIS — R001 Bradycardia, unspecified: Secondary | ICD-10-CM | POA: Diagnosis not present

## 2019-11-06 DIAGNOSIS — Z4509 Encounter for adjustment and management of other cardiac device: Secondary | ICD-10-CM | POA: Diagnosis not present

## 2019-11-06 DIAGNOSIS — R000 Tachycardia, unspecified: Secondary | ICD-10-CM | POA: Diagnosis not present

## 2019-11-07 DIAGNOSIS — E559 Vitamin D deficiency, unspecified: Secondary | ICD-10-CM | POA: Diagnosis not present

## 2019-11-07 DIAGNOSIS — E7800 Pure hypercholesterolemia, unspecified: Secondary | ICD-10-CM | POA: Diagnosis not present

## 2019-11-07 DIAGNOSIS — E039 Hypothyroidism, unspecified: Secondary | ICD-10-CM | POA: Diagnosis not present

## 2019-11-07 DIAGNOSIS — Z1159 Encounter for screening for other viral diseases: Secondary | ICD-10-CM | POA: Diagnosis not present

## 2019-11-07 DIAGNOSIS — R9430 Abnormal result of cardiovascular function study, unspecified: Secondary | ICD-10-CM | POA: Diagnosis not present

## 2019-11-07 DIAGNOSIS — R7301 Impaired fasting glucose: Secondary | ICD-10-CM | POA: Diagnosis not present

## 2019-11-07 DIAGNOSIS — Z789 Other specified health status: Secondary | ICD-10-CM | POA: Diagnosis not present

## 2019-11-18 DIAGNOSIS — F4323 Adjustment disorder with mixed anxiety and depressed mood: Secondary | ICD-10-CM | POA: Diagnosis not present

## 2019-11-22 MED FILL — ESCITALOPRAM 20 MG TABLET: 20 | 30 days supply | Qty: 30 | Fill #3

## 2019-11-22 MED FILL — CLONAZEPAM 1 MG TABS: 1 | 30 days supply | Qty: 30 | Fill #1

## 2019-12-08 DIAGNOSIS — K754 Autoimmune hepatitis: Secondary | ICD-10-CM | POA: Diagnosis not present

## 2019-12-08 DIAGNOSIS — R001 Bradycardia, unspecified: Secondary | ICD-10-CM | POA: Diagnosis not present

## 2019-12-08 DIAGNOSIS — R000 Tachycardia, unspecified: Secondary | ICD-10-CM | POA: Diagnosis not present

## 2019-12-08 DIAGNOSIS — I420 Dilated cardiomyopathy: Secondary | ICD-10-CM | POA: Diagnosis not present

## 2019-12-08 DIAGNOSIS — I5022 Chronic systolic (congestive) heart failure: Secondary | ICD-10-CM | POA: Diagnosis not present

## 2019-12-08 DIAGNOSIS — Z79899 Other long term (current) drug therapy: Secondary | ICD-10-CM | POA: Diagnosis not present

## 2019-12-08 DIAGNOSIS — K219 Gastro-esophageal reflux disease without esophagitis: Secondary | ICD-10-CM | POA: Diagnosis not present

## 2019-12-08 DIAGNOSIS — E785 Hyperlipidemia, unspecified: Secondary | ICD-10-CM | POA: Diagnosis not present

## 2019-12-08 DIAGNOSIS — R9439 Abnormal result of other cardiovascular function study: Secondary | ICD-10-CM | POA: Diagnosis not present

## 2019-12-08 DIAGNOSIS — Z8241 Family history of sudden cardiac death: Secondary | ICD-10-CM | POA: Diagnosis not present

## 2019-12-10 DIAGNOSIS — M546 Pain in thoracic spine: Secondary | ICD-10-CM | POA: Diagnosis not present

## 2019-12-10 DIAGNOSIS — I471 Supraventricular tachycardia: Secondary | ICD-10-CM | POA: Diagnosis not present

## 2019-12-10 DIAGNOSIS — N200 Calculus of kidney: Secondary | ICD-10-CM | POA: Diagnosis not present

## 2019-12-10 DIAGNOSIS — E039 Hypothyroidism, unspecified: Secondary | ICD-10-CM | POA: Diagnosis not present

## 2019-12-10 DIAGNOSIS — E7800 Pure hypercholesterolemia, unspecified: Secondary | ICD-10-CM | POA: Diagnosis not present

## 2019-12-10 DIAGNOSIS — R0781 Pleurodynia: Secondary | ICD-10-CM | POA: Diagnosis not present

## 2019-12-10 DIAGNOSIS — I447 Left bundle-branch block, unspecified: Secondary | ICD-10-CM | POA: Diagnosis not present

## 2019-12-10 DIAGNOSIS — M549 Dorsalgia, unspecified: Secondary | ICD-10-CM | POA: Diagnosis not present

## 2019-12-10 DIAGNOSIS — R232 Flushing: Secondary | ICD-10-CM | POA: Diagnosis not present

## 2019-12-10 DIAGNOSIS — M5412 Radiculopathy, cervical region: Secondary | ICD-10-CM | POA: Diagnosis not present

## 2019-12-12 DIAGNOSIS — N200 Calculus of kidney: Secondary | ICD-10-CM | POA: Diagnosis not present

## 2019-12-12 DIAGNOSIS — F4323 Adjustment disorder with mixed anxiety and depressed mood: Secondary | ICD-10-CM | POA: Diagnosis not present

## 2019-12-12 DIAGNOSIS — R232 Flushing: Secondary | ICD-10-CM | POA: Diagnosis not present

## 2019-12-12 DIAGNOSIS — I471 Supraventricular tachycardia: Secondary | ICD-10-CM | POA: Diagnosis not present

## 2019-12-18 MED FILL — ESCITALOPRAM 20 MG TABLET: 20 | 30 days supply | Qty: 30 | Fill #4

## 2019-12-22 ENCOUNTER — Other Ambulatory Visit (HOSPITAL_COMMUNITY): Payer: Self-pay | Admitting: Internal Medicine

## 2019-12-26 DIAGNOSIS — Z4509 Encounter for adjustment and management of other cardiac device: Secondary | ICD-10-CM | POA: Diagnosis not present

## 2020-01-02 DIAGNOSIS — F4323 Adjustment disorder with mixed anxiety and depressed mood: Secondary | ICD-10-CM | POA: Diagnosis not present

## 2020-01-07 ENCOUNTER — Other Ambulatory Visit (HOSPITAL_COMMUNITY): Payer: Self-pay | Admitting: Clinical Cardiac Electrophysiology

## 2020-01-07 DIAGNOSIS — F331 Major depressive disorder, recurrent, moderate: Secondary | ICD-10-CM | POA: Diagnosis not present

## 2020-01-07 DIAGNOSIS — Z79899 Other long term (current) drug therapy: Secondary | ICD-10-CM | POA: Diagnosis not present

## 2020-01-07 DIAGNOSIS — F411 Generalized anxiety disorder: Secondary | ICD-10-CM | POA: Diagnosis not present

## 2020-01-07 MED FILL — ESTRADIOL 0.05 MG/24HR PTTW: 0.05 | 84 days supply | Qty: 24 | Fill #1

## 2020-01-07 MED FILL — BYSTOLIC 2.5 MG TABLET: 2.5 | 90 days supply | Qty: 90 | Fill #0

## 2020-01-12 MED FILL — ESCITALOPRAM 20 MG TABLET: 20 | 30 days supply | Qty: 30 | Fill #5

## 2020-01-15 DIAGNOSIS — M549 Dorsalgia, unspecified: Secondary | ICD-10-CM | POA: Diagnosis not present

## 2020-01-15 DIAGNOSIS — M5416 Radiculopathy, lumbar region: Secondary | ICD-10-CM | POA: Diagnosis not present

## 2020-01-15 DIAGNOSIS — R0683 Snoring: Secondary | ICD-10-CM | POA: Diagnosis not present

## 2020-01-15 DIAGNOSIS — M545 Low back pain: Secondary | ICD-10-CM | POA: Diagnosis not present

## 2020-01-20 DIAGNOSIS — Z13820 Encounter for screening for osteoporosis: Secondary | ICD-10-CM | POA: Diagnosis not present

## 2020-01-30 MED FILL — ACYCLOVIR 400 MG TABLET: 400 | 90 days supply | Qty: 90 | Fill #2

## 2020-01-30 MED FILL — ESOMEPRAZOLE MAG DR 40 MG C: 40 | 90 days supply | Qty: 90 | Fill #2

## 2020-02-03 DIAGNOSIS — M48061 Spinal stenosis, lumbar region without neurogenic claudication: Secondary | ICD-10-CM | POA: Diagnosis not present

## 2020-02-03 DIAGNOSIS — M4316 Spondylolisthesis, lumbar region: Secondary | ICD-10-CM | POA: Diagnosis not present

## 2020-02-03 DIAGNOSIS — M545 Low back pain: Secondary | ICD-10-CM | POA: Diagnosis not present

## 2020-02-03 DIAGNOSIS — M79604 Pain in right leg: Secondary | ICD-10-CM | POA: Diagnosis not present

## 2020-02-03 DIAGNOSIS — M47816 Spondylosis without myelopathy or radiculopathy, lumbar region: Secondary | ICD-10-CM | POA: Diagnosis not present

## 2020-02-06 DIAGNOSIS — I5022 Chronic systolic (congestive) heart failure: Secondary | ICD-10-CM | POA: Diagnosis not present

## 2020-02-06 DIAGNOSIS — I447 Left bundle-branch block, unspecified: Secondary | ICD-10-CM | POA: Diagnosis not present

## 2020-02-06 DIAGNOSIS — R55 Syncope and collapse: Secondary | ICD-10-CM | POA: Diagnosis not present

## 2020-02-06 DIAGNOSIS — R0600 Dyspnea, unspecified: Secondary | ICD-10-CM | POA: Diagnosis not present

## 2020-02-06 DIAGNOSIS — I420 Dilated cardiomyopathy: Secondary | ICD-10-CM | POA: Diagnosis not present

## 2020-02-06 DIAGNOSIS — M545 Low back pain: Secondary | ICD-10-CM | POA: Diagnosis not present

## 2020-02-06 DIAGNOSIS — R001 Bradycardia, unspecified: Secondary | ICD-10-CM | POA: Diagnosis not present

## 2020-02-06 DIAGNOSIS — R000 Tachycardia, unspecified: Secondary | ICD-10-CM | POA: Diagnosis not present

## 2020-02-06 DIAGNOSIS — I25119 Atherosclerotic heart disease of native coronary artery with unspecified angina pectoris: Secondary | ICD-10-CM | POA: Diagnosis not present

## 2020-02-16 ENCOUNTER — Other Ambulatory Visit (HOSPITAL_COMMUNITY): Payer: Self-pay | Admitting: Internal Medicine

## 2020-02-16 DIAGNOSIS — D492 Neoplasm of unspecified behavior of bone, soft tissue, and skin: Secondary | ICD-10-CM | POA: Diagnosis not present

## 2020-02-16 DIAGNOSIS — L719 Rosacea, unspecified: Secondary | ICD-10-CM | POA: Diagnosis not present

## 2020-02-16 DIAGNOSIS — M5416 Radiculopathy, lumbar region: Secondary | ICD-10-CM | POA: Diagnosis not present

## 2020-02-16 DIAGNOSIS — E7800 Pure hypercholesterolemia, unspecified: Secondary | ICD-10-CM | POA: Diagnosis not present

## 2020-02-16 DIAGNOSIS — I5042 Chronic combined systolic (congestive) and diastolic (congestive) heart failure: Secondary | ICD-10-CM | POA: Diagnosis not present

## 2020-02-16 DIAGNOSIS — I447 Left bundle-branch block, unspecified: Secondary | ICD-10-CM | POA: Diagnosis not present

## 2020-02-16 DIAGNOSIS — M48061 Spinal stenosis, lumbar region without neurogenic claudication: Secondary | ICD-10-CM | POA: Diagnosis not present

## 2020-02-16 DIAGNOSIS — R7301 Impaired fasting glucose: Secondary | ICD-10-CM | POA: Diagnosis not present

## 2020-02-16 DIAGNOSIS — E039 Hypothyroidism, unspecified: Secondary | ICD-10-CM | POA: Diagnosis not present

## 2020-02-16 MED FILL — ROSUVASTATIN CALCIUM 20 MG: 20 | 90 days supply | Qty: 90 | Fill #0

## 2020-02-16 MED FILL — CLONAZEPAM 1 MG TABS: 1 | 30 days supply | Qty: 30 | Fill #0

## 2020-02-16 MED FILL — AZELAIC ACID 15 % GEL: 15 | 30 days supply | Qty: 50 | Fill #0

## 2020-02-16 MED FILL — ESCITALOPRAM 20 MG TABLET: 20 | 90 days supply | Qty: 90 | Fill #0

## 2020-02-16 MED FILL — CLOBETASOL PROP 0.05% FOAM: 0.05 | 30 days supply | Qty: 100 | Fill #0

## 2020-02-17 DIAGNOSIS — H903 Sensorineural hearing loss, bilateral: Secondary | ICD-10-CM | POA: Diagnosis not present

## 2020-02-17 DIAGNOSIS — R0683 Snoring: Secondary | ICD-10-CM | POA: Diagnosis not present

## 2020-03-05 DIAGNOSIS — R5383 Other fatigue: Secondary | ICD-10-CM | POA: Diagnosis not present

## 2020-03-05 DIAGNOSIS — G479 Sleep disorder, unspecified: Secondary | ICD-10-CM | POA: Diagnosis not present

## 2020-03-05 DIAGNOSIS — G4719 Other hypersomnia: Secondary | ICD-10-CM | POA: Diagnosis not present

## 2020-03-05 DIAGNOSIS — R0681 Apnea, not elsewhere classified: Secondary | ICD-10-CM | POA: Diagnosis not present

## 2020-03-05 DIAGNOSIS — R0683 Snoring: Secondary | ICD-10-CM | POA: Diagnosis not present

## 2020-03-09 DIAGNOSIS — G4733 Obstructive sleep apnea (adult) (pediatric): Secondary | ICD-10-CM | POA: Diagnosis not present

## 2020-03-18 MED FILL — LEVOTHYROXINE SODIUM 75 MCG: 75 | 90 days supply | Qty: 90 | Fill #1

## 2020-03-24 DIAGNOSIS — M4316 Spondylolisthesis, lumbar region: Secondary | ICD-10-CM | POA: Diagnosis not present

## 2020-03-24 DIAGNOSIS — M5136 Other intervertebral disc degeneration, lumbar region: Secondary | ICD-10-CM | POA: Diagnosis not present

## 2020-03-24 DIAGNOSIS — D3610 Benign neoplasm of peripheral nerves and autonomic nervous system, unspecified: Secondary | ICD-10-CM | POA: Diagnosis not present

## 2020-03-24 DIAGNOSIS — G8929 Other chronic pain: Secondary | ICD-10-CM | POA: Diagnosis not present

## 2020-03-24 DIAGNOSIS — M47816 Spondylosis without myelopathy or radiculopathy, lumbar region: Secondary | ICD-10-CM | POA: Diagnosis not present

## 2020-03-24 DIAGNOSIS — M48061 Spinal stenosis, lumbar region without neurogenic claudication: Secondary | ICD-10-CM | POA: Diagnosis not present

## 2020-03-24 DIAGNOSIS — R0683 Snoring: Secondary | ICD-10-CM | POA: Diagnosis not present

## 2020-03-24 DIAGNOSIS — M5127 Other intervertebral disc displacement, lumbosacral region: Secondary | ICD-10-CM | POA: Diagnosis not present

## 2020-03-24 DIAGNOSIS — M5441 Lumbago with sciatica, right side: Secondary | ICD-10-CM | POA: Diagnosis not present

## 2020-03-24 DIAGNOSIS — M545 Low back pain: Secondary | ICD-10-CM | POA: Diagnosis not present

## 2020-03-24 DIAGNOSIS — G4733 Obstructive sleep apnea (adult) (pediatric): Secondary | ICD-10-CM | POA: Diagnosis not present

## 2020-03-31 ENCOUNTER — Other Ambulatory Visit (HOSPITAL_COMMUNITY): Payer: Self-pay | Admitting: Pain Medicine

## 2020-03-31 DIAGNOSIS — M5441 Lumbago with sciatica, right side: Secondary | ICD-10-CM | POA: Diagnosis not present

## 2020-03-31 DIAGNOSIS — M48061 Spinal stenosis, lumbar region without neurogenic claudication: Secondary | ICD-10-CM | POA: Diagnosis not present

## 2020-03-31 DIAGNOSIS — G2581 Restless legs syndrome: Secondary | ICD-10-CM | POA: Diagnosis not present

## 2020-03-31 DIAGNOSIS — M4316 Spondylolisthesis, lumbar region: Secondary | ICD-10-CM | POA: Diagnosis not present

## 2020-03-31 DIAGNOSIS — D3610 Benign neoplasm of peripheral nerves and autonomic nervous system, unspecified: Secondary | ICD-10-CM | POA: Diagnosis not present

## 2020-03-31 DIAGNOSIS — G8929 Other chronic pain: Secondary | ICD-10-CM | POA: Diagnosis not present

## 2020-03-31 MED FILL — rOPINIRole HCL 0.5 MG TABS: 0.5 | 30 days supply | Qty: 30 | Fill #0

## 2020-04-02 DIAGNOSIS — G4733 Obstructive sleep apnea (adult) (pediatric): Secondary | ICD-10-CM | POA: Diagnosis not present

## 2020-04-02 MED FILL — BYSTOLIC 2.5 MG TABLET: 2.5 | 90 days supply | Qty: 90 | Fill #1

## 2020-04-07 DIAGNOSIS — M4316 Spondylolisthesis, lumbar region: Secondary | ICD-10-CM | POA: Diagnosis not present

## 2020-04-07 DIAGNOSIS — M545 Low back pain: Secondary | ICD-10-CM | POA: Diagnosis not present

## 2020-04-07 DIAGNOSIS — M5441 Lumbago with sciatica, right side: Secondary | ICD-10-CM | POA: Diagnosis not present

## 2020-04-07 DIAGNOSIS — G8929 Other chronic pain: Secondary | ICD-10-CM | POA: Diagnosis not present

## 2020-04-13 DIAGNOSIS — M5441 Lumbago with sciatica, right side: Secondary | ICD-10-CM | POA: Diagnosis not present

## 2020-04-13 DIAGNOSIS — G8929 Other chronic pain: Secondary | ICD-10-CM | POA: Diagnosis not present

## 2020-04-13 DIAGNOSIS — M545 Low back pain: Secondary | ICD-10-CM | POA: Diagnosis not present

## 2020-04-13 DIAGNOSIS — M4316 Spondylolisthesis, lumbar region: Secondary | ICD-10-CM | POA: Diagnosis not present

## 2020-04-15 DIAGNOSIS — L821 Other seborrheic keratosis: Secondary | ICD-10-CM | POA: Diagnosis not present

## 2020-04-15 DIAGNOSIS — L814 Other melanin hyperpigmentation: Secondary | ICD-10-CM | POA: Diagnosis not present

## 2020-04-15 DIAGNOSIS — D1801 Hemangioma of skin and subcutaneous tissue: Secondary | ICD-10-CM | POA: Diagnosis not present

## 2020-04-15 DIAGNOSIS — Z85828 Personal history of other malignant neoplasm of skin: Secondary | ICD-10-CM | POA: Diagnosis not present

## 2020-04-15 DIAGNOSIS — L905 Scar conditions and fibrosis of skin: Secondary | ICD-10-CM | POA: Diagnosis not present

## 2020-04-16 DIAGNOSIS — M4316 Spondylolisthesis, lumbar region: Secondary | ICD-10-CM | POA: Diagnosis not present

## 2020-04-16 DIAGNOSIS — M48061 Spinal stenosis, lumbar region without neurogenic claudication: Secondary | ICD-10-CM | POA: Diagnosis not present

## 2020-04-16 DIAGNOSIS — D3610 Benign neoplasm of peripheral nerves and autonomic nervous system, unspecified: Secondary | ICD-10-CM | POA: Diagnosis not present

## 2020-04-16 DIAGNOSIS — M5126 Other intervertebral disc displacement, lumbar region: Secondary | ICD-10-CM | POA: Diagnosis not present

## 2020-04-16 DIAGNOSIS — M47816 Spondylosis without myelopathy or radiculopathy, lumbar region: Secondary | ICD-10-CM | POA: Diagnosis not present

## 2020-04-19 DIAGNOSIS — M5127 Other intervertebral disc displacement, lumbosacral region: Secondary | ICD-10-CM | POA: Diagnosis not present

## 2020-04-19 DIAGNOSIS — G8929 Other chronic pain: Secondary | ICD-10-CM | POA: Diagnosis not present

## 2020-04-19 DIAGNOSIS — M5136 Other intervertebral disc degeneration, lumbar region: Secondary | ICD-10-CM | POA: Diagnosis not present

## 2020-04-20 DIAGNOSIS — M5416 Radiculopathy, lumbar region: Secondary | ICD-10-CM | POA: Diagnosis not present

## 2020-04-22 DIAGNOSIS — G8929 Other chronic pain: Secondary | ICD-10-CM | POA: Diagnosis not present

## 2020-04-22 DIAGNOSIS — M545 Low back pain, unspecified: Secondary | ICD-10-CM | POA: Diagnosis not present

## 2020-04-22 DIAGNOSIS — M5441 Lumbago with sciatica, right side: Secondary | ICD-10-CM | POA: Diagnosis not present

## 2020-04-22 DIAGNOSIS — M4316 Spondylolisthesis, lumbar region: Secondary | ICD-10-CM | POA: Diagnosis not present

## 2020-04-28 MED FILL — CLONAZEPAM 1 MG TABS: 1 | 30 days supply | Qty: 30 | Fill #1

## 2020-04-28 MED FILL — ESOMEPRAZOLE MAG DR 40 MG C: 40 | 90 days supply | Qty: 90 | Fill #3

## 2020-04-28 MED FILL — ACYCLOVIR 400 MG TABLET: 400 | 90 days supply | Qty: 90 | Fill #3

## 2020-04-29 DIAGNOSIS — M545 Low back pain, unspecified: Secondary | ICD-10-CM | POA: Diagnosis not present

## 2020-04-29 DIAGNOSIS — M4316 Spondylolisthesis, lumbar region: Secondary | ICD-10-CM | POA: Diagnosis not present

## 2020-04-29 DIAGNOSIS — M5441 Lumbago with sciatica, right side: Secondary | ICD-10-CM | POA: Diagnosis not present

## 2020-04-29 DIAGNOSIS — G8929 Other chronic pain: Secondary | ICD-10-CM | POA: Diagnosis not present

## 2020-05-06 DIAGNOSIS — M4316 Spondylolisthesis, lumbar region: Secondary | ICD-10-CM | POA: Diagnosis not present

## 2020-05-06 DIAGNOSIS — G8929 Other chronic pain: Secondary | ICD-10-CM | POA: Diagnosis not present

## 2020-05-06 DIAGNOSIS — M545 Low back pain, unspecified: Secondary | ICD-10-CM | POA: Diagnosis not present

## 2020-05-06 DIAGNOSIS — M5441 Lumbago with sciatica, right side: Secondary | ICD-10-CM | POA: Diagnosis not present

## 2020-05-11 MED FILL — ROSUVASTATIN CALCIUM 20 MG: 20 | 90 days supply | Qty: 90 | Fill #1

## 2020-05-11 MED FILL — ESCITALOPRAM 20 MG TABLET: 20 | 90 days supply | Qty: 90 | Fill #1

## 2020-05-13 DIAGNOSIS — M4316 Spondylolisthesis, lumbar region: Secondary | ICD-10-CM | POA: Diagnosis not present

## 2020-05-13 DIAGNOSIS — M5441 Lumbago with sciatica, right side: Secondary | ICD-10-CM | POA: Diagnosis not present

## 2020-05-13 DIAGNOSIS — M545 Low back pain, unspecified: Secondary | ICD-10-CM | POA: Diagnosis not present

## 2020-05-13 DIAGNOSIS — G8929 Other chronic pain: Secondary | ICD-10-CM | POA: Diagnosis not present

## 2020-05-17 DIAGNOSIS — M5441 Lumbago with sciatica, right side: Secondary | ICD-10-CM | POA: Diagnosis not present

## 2020-05-17 DIAGNOSIS — M4316 Spondylolisthesis, lumbar region: Secondary | ICD-10-CM | POA: Diagnosis not present

## 2020-05-17 DIAGNOSIS — G8929 Other chronic pain: Secondary | ICD-10-CM | POA: Diagnosis not present

## 2020-05-17 DIAGNOSIS — M545 Low back pain, unspecified: Secondary | ICD-10-CM | POA: Diagnosis not present

## 2020-05-17 DIAGNOSIS — M5459 Other low back pain: Secondary | ICD-10-CM | POA: Diagnosis not present

## 2020-05-18 DIAGNOSIS — G4733 Obstructive sleep apnea (adult) (pediatric): Secondary | ICD-10-CM | POA: Diagnosis not present

## 2020-05-18 MED FILL — ESTRADIOL 0.05 MG/24HR PTTW: 0.05 | 84 days supply | Qty: 24 | Fill #2

## 2020-05-21 DIAGNOSIS — H40013 Open angle with borderline findings, low risk, bilateral: Secondary | ICD-10-CM | POA: Diagnosis not present

## 2020-05-28 DIAGNOSIS — M5459 Other low back pain: Secondary | ICD-10-CM | POA: Diagnosis not present

## 2020-05-28 DIAGNOSIS — Z4509 Encounter for adjustment and management of other cardiac device: Secondary | ICD-10-CM | POA: Diagnosis not present

## 2020-05-28 DIAGNOSIS — M545 Low back pain, unspecified: Secondary | ICD-10-CM | POA: Diagnosis not present

## 2020-05-28 DIAGNOSIS — M5441 Lumbago with sciatica, right side: Secondary | ICD-10-CM | POA: Diagnosis not present

## 2020-05-28 DIAGNOSIS — G8929 Other chronic pain: Secondary | ICD-10-CM | POA: Diagnosis not present

## 2020-05-28 DIAGNOSIS — M4316 Spondylolisthesis, lumbar region: Secondary | ICD-10-CM | POA: Diagnosis not present

## 2020-05-31 DIAGNOSIS — M4316 Spondylolisthesis, lumbar region: Secondary | ICD-10-CM | POA: Diagnosis not present

## 2020-05-31 DIAGNOSIS — M5441 Lumbago with sciatica, right side: Secondary | ICD-10-CM | POA: Diagnosis not present

## 2020-05-31 DIAGNOSIS — M48061 Spinal stenosis, lumbar region without neurogenic claudication: Secondary | ICD-10-CM | POA: Diagnosis not present

## 2020-05-31 DIAGNOSIS — D3610 Benign neoplasm of peripheral nerves and autonomic nervous system, unspecified: Secondary | ICD-10-CM | POA: Diagnosis not present

## 2020-05-31 DIAGNOSIS — M5127 Other intervertebral disc displacement, lumbosacral region: Secondary | ICD-10-CM | POA: Diagnosis not present

## 2020-05-31 DIAGNOSIS — G4733 Obstructive sleep apnea (adult) (pediatric): Secondary | ICD-10-CM | POA: Diagnosis not present

## 2020-05-31 DIAGNOSIS — G8929 Other chronic pain: Secondary | ICD-10-CM | POA: Diagnosis not present

## 2020-06-02 DIAGNOSIS — G4761 Periodic limb movement disorder: Secondary | ICD-10-CM | POA: Diagnosis not present

## 2020-06-02 DIAGNOSIS — Z23 Encounter for immunization: Secondary | ICD-10-CM | POA: Diagnosis not present

## 2020-06-02 DIAGNOSIS — I5042 Chronic combined systolic (congestive) and diastolic (congestive) heart failure: Secondary | ICD-10-CM | POA: Diagnosis not present

## 2020-06-02 DIAGNOSIS — E7800 Pure hypercholesterolemia, unspecified: Secondary | ICD-10-CM | POA: Diagnosis not present

## 2020-06-02 DIAGNOSIS — M4316 Spondylolisthesis, lumbar region: Secondary | ICD-10-CM | POA: Diagnosis not present

## 2020-06-02 DIAGNOSIS — M545 Low back pain, unspecified: Secondary | ICD-10-CM | POA: Diagnosis not present

## 2020-06-02 DIAGNOSIS — N200 Calculus of kidney: Secondary | ICD-10-CM | POA: Diagnosis not present

## 2020-06-02 DIAGNOSIS — E559 Vitamin D deficiency, unspecified: Secondary | ICD-10-CM | POA: Diagnosis not present

## 2020-06-02 DIAGNOSIS — M5441 Lumbago with sciatica, right side: Secondary | ICD-10-CM | POA: Diagnosis not present

## 2020-06-02 DIAGNOSIS — R7301 Impaired fasting glucose: Secondary | ICD-10-CM | POA: Diagnosis not present

## 2020-06-02 DIAGNOSIS — E039 Hypothyroidism, unspecified: Secondary | ICD-10-CM | POA: Diagnosis not present

## 2020-06-02 DIAGNOSIS — K219 Gastro-esophageal reflux disease without esophagitis: Secondary | ICD-10-CM | POA: Diagnosis not present

## 2020-06-02 DIAGNOSIS — G8929 Other chronic pain: Secondary | ICD-10-CM | POA: Diagnosis not present

## 2020-06-02 DIAGNOSIS — D3610 Benign neoplasm of peripheral nerves and autonomic nervous system, unspecified: Secondary | ICD-10-CM | POA: Diagnosis not present

## 2020-06-02 DIAGNOSIS — M5459 Other low back pain: Secondary | ICD-10-CM | POA: Diagnosis not present

## 2020-06-02 DIAGNOSIS — M5416 Radiculopathy, lumbar region: Secondary | ICD-10-CM | POA: Diagnosis not present

## 2020-06-15 MED FILL — CLONAZEPAM 1 MG TABS: 1 | 30 days supply | Qty: 30 | Fill #2

## 2020-06-15 MED FILL — LEVOTHYROXINE SODIUM 75 MCG: 75 | 90 days supply | Qty: 90 | Fill #2

## 2020-06-17 DIAGNOSIS — Z23 Encounter for immunization: Secondary | ICD-10-CM | POA: Diagnosis not present

## 2020-06-18 ENCOUNTER — Other Ambulatory Visit (HOSPITAL_COMMUNITY): Payer: Self-pay | Admitting: Internal Medicine

## 2020-06-18 MED FILL — PREGABALIN 50 MG CAPS: 50 | 30 days supply | Qty: 60 | Fill #0

## 2020-06-25 DIAGNOSIS — G4733 Obstructive sleep apnea (adult) (pediatric): Secondary | ICD-10-CM | POA: Diagnosis not present

## 2020-06-29 DIAGNOSIS — M47814 Spondylosis without myelopathy or radiculopathy, thoracic region: Secondary | ICD-10-CM | POA: Diagnosis not present

## 2020-06-29 DIAGNOSIS — M47812 Spondylosis without myelopathy or radiculopathy, cervical region: Secondary | ICD-10-CM | POA: Diagnosis not present

## 2020-06-29 DIAGNOSIS — D3610 Benign neoplasm of peripheral nerves and autonomic nervous system, unspecified: Secondary | ICD-10-CM | POA: Diagnosis not present

## 2020-06-29 DIAGNOSIS — C719 Malignant neoplasm of brain, unspecified: Secondary | ICD-10-CM | POA: Diagnosis not present

## 2020-06-30 DIAGNOSIS — M50321 Other cervical disc degeneration at C4-C5 level: Secondary | ICD-10-CM | POA: Diagnosis not present

## 2020-06-30 DIAGNOSIS — D3610 Benign neoplasm of peripheral nerves and autonomic nervous system, unspecified: Secondary | ICD-10-CM | POA: Diagnosis not present

## 2020-06-30 DIAGNOSIS — M4802 Spinal stenosis, cervical region: Secondary | ICD-10-CM | POA: Diagnosis not present

## 2020-06-30 DIAGNOSIS — M4316 Spondylolisthesis, lumbar region: Secondary | ICD-10-CM | POA: Diagnosis not present

## 2020-06-30 DIAGNOSIS — M5441 Lumbago with sciatica, right side: Secondary | ICD-10-CM | POA: Diagnosis not present

## 2020-06-30 DIAGNOSIS — M4722 Other spondylosis with radiculopathy, cervical region: Secondary | ICD-10-CM | POA: Diagnosis not present

## 2020-06-30 DIAGNOSIS — G8929 Other chronic pain: Secondary | ICD-10-CM | POA: Diagnosis not present

## 2020-06-30 DIAGNOSIS — M48061 Spinal stenosis, lumbar region without neurogenic claudication: Secondary | ICD-10-CM | POA: Diagnosis not present

## 2020-06-30 DIAGNOSIS — M5090 Cervical disc disorder, unspecified, unspecified cervical region: Secondary | ICD-10-CM | POA: Diagnosis not present

## 2020-06-30 DIAGNOSIS — M47812 Spondylosis without myelopathy or radiculopathy, cervical region: Secondary | ICD-10-CM | POA: Diagnosis not present

## 2020-07-01 DIAGNOSIS — G4733 Obstructive sleep apnea (adult) (pediatric): Secondary | ICD-10-CM | POA: Diagnosis not present

## 2020-07-05 ENCOUNTER — Other Ambulatory Visit (HOSPITAL_COMMUNITY): Payer: Self-pay | Admitting: Internal Medicine

## 2020-07-05 DIAGNOSIS — N3281 Overactive bladder: Secondary | ICD-10-CM | POA: Diagnosis not present

## 2020-07-05 DIAGNOSIS — N3946 Mixed incontinence: Secondary | ICD-10-CM | POA: Diagnosis not present

## 2020-07-05 DIAGNOSIS — N952 Postmenopausal atrophic vaginitis: Secondary | ICD-10-CM | POA: Diagnosis not present

## 2020-07-05 MED FILL — ESTRADIOL 0.1 MG/GM CREA: 0.1 | 90 days supply | Qty: 43 | Fill #0

## 2020-07-06 ENCOUNTER — Other Ambulatory Visit (HOSPITAL_COMMUNITY): Payer: Self-pay | Admitting: Internal Medicine

## 2020-07-06 MED FILL — NEBIVOLOL HCL 2.5 MG TABS: 2.5 | 90 days supply | Qty: 90 | Fill #0

## 2020-07-12 DIAGNOSIS — H903 Sensorineural hearing loss, bilateral: Secondary | ICD-10-CM | POA: Diagnosis not present

## 2020-07-26 DIAGNOSIS — G4733 Obstructive sleep apnea (adult) (pediatric): Secondary | ICD-10-CM | POA: Diagnosis not present

## 2020-08-03 DIAGNOSIS — G4733 Obstructive sleep apnea (adult) (pediatric): Secondary | ICD-10-CM | POA: Diagnosis not present

## 2020-08-04 DIAGNOSIS — G8929 Other chronic pain: Secondary | ICD-10-CM | POA: Diagnosis not present

## 2020-08-04 DIAGNOSIS — M48061 Spinal stenosis, lumbar region without neurogenic claudication: Secondary | ICD-10-CM | POA: Diagnosis not present

## 2020-08-04 DIAGNOSIS — M5441 Lumbago with sciatica, right side: Secondary | ICD-10-CM | POA: Diagnosis not present

## 2020-08-04 DIAGNOSIS — D3610 Benign neoplasm of peripheral nerves and autonomic nervous system, unspecified: Secondary | ICD-10-CM | POA: Diagnosis not present

## 2020-08-04 DIAGNOSIS — M47816 Spondylosis without myelopathy or radiculopathy, lumbar region: Secondary | ICD-10-CM | POA: Diagnosis not present

## 2020-08-04 DIAGNOSIS — M4316 Spondylolisthesis, lumbar region: Secondary | ICD-10-CM | POA: Diagnosis not present

## 2020-08-06 MED FILL — ROSUVASTATIN CALCIUM 20 MG: 20 | 90 days supply | Qty: 90 | Fill #2

## 2020-08-06 MED FILL — ESOMEPRAZOLE MAG DR 40 MG C: 40 | 90 days supply | Qty: 90 | Fill #0

## 2020-08-06 MED FILL — ROPINIROLE HCL 0.5 MG TABS: 0.5 | 30 days supply | Qty: 30 | Fill #1

## 2020-08-09 ENCOUNTER — Other Ambulatory Visit (HOSPITAL_COMMUNITY): Payer: Self-pay | Admitting: Internal Medicine

## 2020-08-09 MED FILL — ACYCLOVIR 400 MG TABLET: 400 | 90 days supply | Qty: 90 | Fill #0

## 2020-08-12 DIAGNOSIS — G4733 Obstructive sleep apnea (adult) (pediatric): Secondary | ICD-10-CM | POA: Diagnosis not present

## 2020-08-12 DIAGNOSIS — R0683 Snoring: Secondary | ICD-10-CM | POA: Diagnosis not present

## 2020-08-17 DIAGNOSIS — Z4509 Encounter for adjustment and management of other cardiac device: Secondary | ICD-10-CM | POA: Diagnosis not present

## 2020-08-18 DIAGNOSIS — H524 Presbyopia: Secondary | ICD-10-CM | POA: Diagnosis not present

## 2020-08-19 ENCOUNTER — Other Ambulatory Visit: Payer: Self-pay | Admitting: Internal Medicine

## 2020-08-19 MED FILL — AZELAIC ACID 15 % GEL: 15 | 30 days supply | Qty: 50 | Fill #1

## 2020-08-23 DIAGNOSIS — M5136 Other intervertebral disc degeneration, lumbar region: Secondary | ICD-10-CM | POA: Diagnosis not present

## 2020-08-23 DIAGNOSIS — G8929 Other chronic pain: Secondary | ICD-10-CM | POA: Diagnosis not present

## 2020-08-23 DIAGNOSIS — M25512 Pain in left shoulder: Secondary | ICD-10-CM | POA: Diagnosis not present

## 2020-08-25 DIAGNOSIS — M5090 Cervical disc disorder, unspecified, unspecified cervical region: Secondary | ICD-10-CM | POA: Diagnosis not present

## 2020-08-25 DIAGNOSIS — M542 Cervicalgia: Secondary | ICD-10-CM | POA: Diagnosis not present

## 2020-08-25 DIAGNOSIS — M4722 Other spondylosis with radiculopathy, cervical region: Secondary | ICD-10-CM | POA: Diagnosis not present

## 2020-08-31 ENCOUNTER — Other Ambulatory Visit (HOSPITAL_COMMUNITY): Payer: Self-pay | Admitting: Internal Medicine

## 2020-08-31 DIAGNOSIS — I447 Left bundle-branch block, unspecified: Secondary | ICD-10-CM | POA: Diagnosis not present

## 2020-08-31 DIAGNOSIS — R350 Frequency of micturition: Secondary | ICD-10-CM | POA: Diagnosis not present

## 2020-08-31 DIAGNOSIS — M5412 Radiculopathy, cervical region: Secondary | ICD-10-CM | POA: Diagnosis not present

## 2020-08-31 DIAGNOSIS — M542 Cervicalgia: Secondary | ICD-10-CM | POA: Diagnosis not present

## 2020-08-31 DIAGNOSIS — M4722 Other spondylosis with radiculopathy, cervical region: Secondary | ICD-10-CM | POA: Diagnosis not present

## 2020-08-31 DIAGNOSIS — M5090 Cervical disc disorder, unspecified, unspecified cervical region: Secondary | ICD-10-CM | POA: Diagnosis not present

## 2020-08-31 DIAGNOSIS — T380X5A Adverse effect of glucocorticoids and synthetic analogues, initial encounter: Secondary | ICD-10-CM | POA: Diagnosis not present

## 2020-08-31 DIAGNOSIS — I471 Supraventricular tachycardia: Secondary | ICD-10-CM | POA: Diagnosis not present

## 2020-08-31 MED FILL — LINZESS 72 MCG CAPSULE: 72 | 30 days supply | Qty: 30 | Fill #0

## 2020-09-01 ENCOUNTER — Other Ambulatory Visit (HOSPITAL_COMMUNITY): Payer: Self-pay | Admitting: Internal Medicine

## 2020-09-01 MED FILL — ESCITALOPRAM 20 MG TABLET: 20 | 90 days supply | Qty: 90 | Fill #0

## 2020-09-07 DIAGNOSIS — M5090 Cervical disc disorder, unspecified, unspecified cervical region: Secondary | ICD-10-CM | POA: Diagnosis not present

## 2020-09-07 DIAGNOSIS — M4722 Other spondylosis with radiculopathy, cervical region: Secondary | ICD-10-CM | POA: Diagnosis not present

## 2020-09-07 DIAGNOSIS — M542 Cervicalgia: Secondary | ICD-10-CM | POA: Diagnosis not present

## 2020-09-09 MED FILL — ROPINIROLE HCL 0.5 MG TABS: 0.5 | 30 days supply | Qty: 30 | Fill #2

## 2020-09-09 MED FILL — LEVOTHYROXINE SODIUM 75 MCG: 75 | 90 days supply | Qty: 90 | Fill #3

## 2020-09-10 DIAGNOSIS — M5090 Cervical disc disorder, unspecified, unspecified cervical region: Secondary | ICD-10-CM | POA: Diagnosis not present

## 2020-09-10 DIAGNOSIS — M4722 Other spondylosis with radiculopathy, cervical region: Secondary | ICD-10-CM | POA: Diagnosis not present

## 2020-09-10 DIAGNOSIS — M542 Cervicalgia: Secondary | ICD-10-CM | POA: Diagnosis not present

## 2020-09-14 DIAGNOSIS — M4722 Other spondylosis with radiculopathy, cervical region: Secondary | ICD-10-CM | POA: Diagnosis not present

## 2020-09-14 DIAGNOSIS — M542 Cervicalgia: Secondary | ICD-10-CM | POA: Diagnosis not present

## 2020-09-14 DIAGNOSIS — M5090 Cervical disc disorder, unspecified, unspecified cervical region: Secondary | ICD-10-CM | POA: Diagnosis not present

## 2020-09-15 DIAGNOSIS — M4722 Other spondylosis with radiculopathy, cervical region: Secondary | ICD-10-CM | POA: Diagnosis not present

## 2020-09-15 DIAGNOSIS — M542 Cervicalgia: Secondary | ICD-10-CM | POA: Diagnosis not present

## 2020-09-15 DIAGNOSIS — M5090 Cervical disc disorder, unspecified, unspecified cervical region: Secondary | ICD-10-CM | POA: Diagnosis not present

## 2020-09-16 DIAGNOSIS — M5136 Other intervertebral disc degeneration, lumbar region: Secondary | ICD-10-CM | POA: Diagnosis not present

## 2020-09-16 DIAGNOSIS — M25512 Pain in left shoulder: Secondary | ICD-10-CM | POA: Diagnosis not present

## 2020-09-16 DIAGNOSIS — G8929 Other chronic pain: Secondary | ICD-10-CM | POA: Diagnosis not present

## 2020-09-24 MED FILL — NEBIVOLOL HCL 2.5 MG TABS: 2.5 | 90 days supply | Qty: 90 | Fill #1

## 2020-09-28 DIAGNOSIS — Z20822 Contact with and (suspected) exposure to covid-19: Secondary | ICD-10-CM | POA: Diagnosis not present

## 2020-09-28 DIAGNOSIS — J22 Unspecified acute lower respiratory infection: Secondary | ICD-10-CM | POA: Diagnosis not present

## 2020-09-28 DIAGNOSIS — Z1159 Encounter for screening for other viral diseases: Secondary | ICD-10-CM | POA: Diagnosis not present

## 2020-09-28 DIAGNOSIS — Z20828 Contact with and (suspected) exposure to other viral communicable diseases: Secondary | ICD-10-CM | POA: Diagnosis not present

## 2020-09-28 DIAGNOSIS — R059 Cough, unspecified: Secondary | ICD-10-CM | POA: Diagnosis not present

## 2020-10-08 ENCOUNTER — Other Ambulatory Visit (HOSPITAL_BASED_OUTPATIENT_CLINIC_OR_DEPARTMENT_OTHER): Payer: Self-pay

## 2020-10-11 DIAGNOSIS — R059 Cough, unspecified: Secondary | ICD-10-CM | POA: Diagnosis not present

## 2020-10-11 DIAGNOSIS — H903 Sensorineural hearing loss, bilateral: Secondary | ICD-10-CM | POA: Diagnosis not present

## 2020-10-11 DIAGNOSIS — L719 Rosacea, unspecified: Secondary | ICD-10-CM | POA: Diagnosis not present

## 2020-10-11 DIAGNOSIS — J309 Allergic rhinitis, unspecified: Secondary | ICD-10-CM | POA: Diagnosis not present

## 2020-10-13 DIAGNOSIS — L730 Acne keloid: Secondary | ICD-10-CM | POA: Diagnosis not present

## 2020-10-13 DIAGNOSIS — L700 Acne vulgaris: Secondary | ICD-10-CM | POA: Diagnosis not present

## 2020-10-13 DIAGNOSIS — Z20822 Contact with and (suspected) exposure to covid-19: Secondary | ICD-10-CM | POA: Diagnosis not present

## 2020-10-18 DIAGNOSIS — R55 Syncope and collapse: Secondary | ICD-10-CM | POA: Diagnosis not present

## 2020-10-19 ENCOUNTER — Other Ambulatory Visit (HOSPITAL_COMMUNITY): Payer: Self-pay

## 2020-10-19 MED ORDER — FLUDROCORTISONE ACETATE 0.1 MG PO TABS
ORAL_TABLET | ORAL | 11 refills | Status: AC
  Filled 2020-10-19: qty 30, 30d supply, fill #0
  Filled 2021-01-13: qty 30, 30d supply, fill #1
  Filled 2021-03-04: qty 30, 30d supply, fill #2

## 2020-10-25 ENCOUNTER — Other Ambulatory Visit (HOSPITAL_COMMUNITY): Payer: Self-pay

## 2020-10-25 MED FILL — Nebivolol HCl Tab 2.5 MG (Base Equivalent): ORAL | 90 days supply | Qty: 90 | Fill #0 | Status: CN

## 2020-10-25 MED FILL — Acyclovir Tab 400 MG: ORAL | 90 days supply | Qty: 90 | Fill #0 | Status: AC

## 2020-10-29 ENCOUNTER — Other Ambulatory Visit (HOSPITAL_COMMUNITY): Payer: Self-pay

## 2020-10-29 DIAGNOSIS — Z760 Encounter for issue of repeat prescription: Secondary | ICD-10-CM | POA: Diagnosis not present

## 2020-11-01 DIAGNOSIS — L718 Other rosacea: Secondary | ICD-10-CM | POA: Diagnosis not present

## 2020-11-02 ENCOUNTER — Other Ambulatory Visit (HOSPITAL_COMMUNITY): Payer: Self-pay

## 2020-11-02 MED ORDER — ESTRADIOL 0.05 MG/24HR TD PTTW
MEDICATED_PATCH | TRANSDERMAL | 3 refills | Status: AC
  Filled 2020-11-02: qty 24, 90d supply, fill #0

## 2020-11-02 MED ORDER — ALBUTEROL SULFATE HFA 108 (90 BASE) MCG/ACT IN AERS
INHALATION_SPRAY | RESPIRATORY_TRACT | 3 refills | Status: AC
  Filled 2020-11-02: qty 25.5, 48d supply, fill #0
  Filled 2021-04-28: qty 54, 50d supply, fill #1

## 2020-11-02 MED ORDER — ESTRADIOL 0.05 MG/24HR TD PTTW
MEDICATED_PATCH | TRANSDERMAL | 3 refills | Status: DC
  Filled 2020-11-02: qty 8, 28d supply, fill #0
  Filled 2020-11-02: qty 16, 56d supply, fill #0
  Filled 2021-02-03 (×2): qty 24, 84d supply, fill #1
  Filled 2021-08-28: qty 24, 84d supply, fill #2

## 2020-11-02 MED ORDER — MAG GLYCINATE 100 MG PO TABS: ORAL_TABLET | ORAL | 3 refills | Status: AC

## 2020-11-02 MED FILL — Rosuvastatin Calcium Tab 20 MG: ORAL | 90 days supply | Qty: 90 | Fill #0 | Status: AC

## 2020-11-02 MED FILL — Escitalopram Oxalate Tab 20 MG (Base Equiv): ORAL | 90 days supply | Qty: 90 | Fill #0 | Status: CN

## 2020-11-02 MED FILL — Esomeprazole Magnesium Cap Delayed Release 40 MG (Base Eq): ORAL | 90 days supply | Qty: 90 | Fill #0 | Status: AC

## 2020-11-02 MED FILL — Nebivolol HCl Tab 2.5 MG (Base Equivalent): ORAL | 90 days supply | Qty: 90 | Fill #0 | Status: CN

## 2020-11-03 ENCOUNTER — Other Ambulatory Visit (HOSPITAL_COMMUNITY): Payer: Self-pay

## 2020-11-04 ENCOUNTER — Other Ambulatory Visit (HOSPITAL_COMMUNITY): Payer: Self-pay

## 2020-11-04 MED FILL — Nebivolol HCl Tab 2.5 MG (Base Equivalent): ORAL | 90 days supply | Qty: 90 | Fill #0 | Status: CN

## 2020-11-05 ENCOUNTER — Other Ambulatory Visit (HOSPITAL_COMMUNITY): Payer: Self-pay

## 2020-11-05 MED ORDER — NEBIVOLOL HCL 5 MG PO TABS
ORAL_TABLET | ORAL | 3 refills | Status: AC
  Filled 2020-11-05: qty 90, 90d supply, fill #0
  Filled 2021-02-16: qty 90, 90d supply, fill #1
  Filled 2021-05-19: qty 90, 90d supply, fill #2

## 2020-11-11 ENCOUNTER — Other Ambulatory Visit (HOSPITAL_COMMUNITY): Payer: Self-pay

## 2020-11-13 ENCOUNTER — Other Ambulatory Visit (HOSPITAL_COMMUNITY): Payer: Self-pay

## 2020-11-16 ENCOUNTER — Other Ambulatory Visit (HOSPITAL_COMMUNITY): Payer: Self-pay

## 2020-11-16 MED ORDER — ROPINIROLE HCL 0.5 MG PO TABS
0.5000 mg | ORAL_TABLET | Freq: Every day | ORAL | 2 refills | Status: AC
  Filled 2020-11-16: qty 30, 30d supply, fill #0
  Filled 2020-12-27: qty 30, 30d supply, fill #1
  Filled 2021-02-03: qty 30, 30d supply, fill #2

## 2020-11-18 ENCOUNTER — Other Ambulatory Visit (HOSPITAL_COMMUNITY): Payer: Self-pay

## 2020-11-23 DIAGNOSIS — Z4509 Encounter for adjustment and management of other cardiac device: Secondary | ICD-10-CM | POA: Diagnosis not present

## 2020-11-24 DIAGNOSIS — H04123 Dry eye syndrome of bilateral lacrimal glands: Secondary | ICD-10-CM | POA: Diagnosis not present

## 2020-12-02 DIAGNOSIS — H04123 Dry eye syndrome of bilateral lacrimal glands: Secondary | ICD-10-CM | POA: Diagnosis not present

## 2020-12-06 ENCOUNTER — Other Ambulatory Visit (HOSPITAL_COMMUNITY): Payer: Self-pay

## 2020-12-06 MED ORDER — LEVOTHYROXINE SODIUM 75 MCG PO TABS
ORAL_TABLET | ORAL | 3 refills | Status: DC
  Filled 2020-12-06: qty 90, 90d supply, fill #0
  Filled 2021-03-04: qty 90, 90d supply, fill #1
  Filled 2021-05-19 – 2021-05-30 (×2): qty 90, 90d supply, fill #2
  Filled 2021-08-28: qty 90, 90d supply, fill #3

## 2020-12-07 ENCOUNTER — Other Ambulatory Visit (HOSPITAL_COMMUNITY): Payer: Self-pay

## 2020-12-07 MED FILL — Escitalopram Oxalate Tab 20 MG (Base Equiv): ORAL | 90 days supply | Qty: 90 | Fill #0 | Status: AC

## 2020-12-08 ENCOUNTER — Other Ambulatory Visit (HOSPITAL_COMMUNITY): Payer: Self-pay

## 2020-12-08 MED ORDER — ESCITALOPRAM OXALATE 20 MG PO TABS
ORAL_TABLET | ORAL | 2 refills | Status: DC
  Filled 2020-12-08 – 2021-09-07 (×2): qty 90, 90d supply, fill #0
  Filled 2021-11-21: qty 90, 90d supply, fill #1

## 2020-12-09 DIAGNOSIS — R7301 Impaired fasting glucose: Secondary | ICD-10-CM | POA: Diagnosis not present

## 2020-12-09 DIAGNOSIS — K219 Gastro-esophageal reflux disease without esophagitis: Secondary | ICD-10-CM | POA: Diagnosis not present

## 2020-12-09 DIAGNOSIS — E7800 Pure hypercholesterolemia, unspecified: Secondary | ICD-10-CM | POA: Diagnosis not present

## 2020-12-09 DIAGNOSIS — E559 Vitamin D deficiency, unspecified: Secondary | ICD-10-CM | POA: Diagnosis not present

## 2020-12-09 DIAGNOSIS — E039 Hypothyroidism, unspecified: Secondary | ICD-10-CM | POA: Diagnosis not present

## 2020-12-09 DIAGNOSIS — E782 Mixed hyperlipidemia: Secondary | ICD-10-CM | POA: Diagnosis not present

## 2020-12-09 DIAGNOSIS — I471 Supraventricular tachycardia: Secondary | ICD-10-CM | POA: Diagnosis not present

## 2020-12-09 DIAGNOSIS — I5042 Chronic combined systolic (congestive) and diastolic (congestive) heart failure: Secondary | ICD-10-CM | POA: Diagnosis not present

## 2020-12-09 DIAGNOSIS — H02409 Unspecified ptosis of unspecified eyelid: Secondary | ICD-10-CM | POA: Diagnosis not present

## 2020-12-14 DIAGNOSIS — F331 Major depressive disorder, recurrent, moderate: Secondary | ICD-10-CM | POA: Diagnosis not present

## 2020-12-16 DIAGNOSIS — H40013 Open angle with borderline findings, low risk, bilateral: Secondary | ICD-10-CM | POA: Diagnosis not present

## 2020-12-27 ENCOUNTER — Other Ambulatory Visit (HOSPITAL_COMMUNITY): Payer: Self-pay

## 2020-12-28 DIAGNOSIS — F331 Major depressive disorder, recurrent, moderate: Secondary | ICD-10-CM | POA: Diagnosis not present

## 2020-12-30 DIAGNOSIS — Z8541 Personal history of malignant neoplasm of cervix uteri: Secondary | ICD-10-CM | POA: Diagnosis not present

## 2020-12-30 DIAGNOSIS — R102 Pelvic and perineal pain: Secondary | ICD-10-CM | POA: Diagnosis not present

## 2020-12-30 DIAGNOSIS — R300 Dysuria: Secondary | ICD-10-CM | POA: Diagnosis not present

## 2020-12-30 DIAGNOSIS — N898 Other specified noninflammatory disorders of vagina: Secondary | ICD-10-CM | POA: Diagnosis not present

## 2020-12-30 DIAGNOSIS — N951 Menopausal and female climacteric states: Secondary | ICD-10-CM | POA: Diagnosis not present

## 2021-01-07 ENCOUNTER — Other Ambulatory Visit (HOSPITAL_COMMUNITY): Payer: Self-pay

## 2021-01-10 DIAGNOSIS — R55 Syncope and collapse: Secondary | ICD-10-CM | POA: Diagnosis not present

## 2021-01-12 DIAGNOSIS — N83292 Other ovarian cyst, left side: Secondary | ICD-10-CM | POA: Diagnosis not present

## 2021-01-12 DIAGNOSIS — R102 Pelvic and perineal pain: Secondary | ICD-10-CM | POA: Diagnosis not present

## 2021-01-12 DIAGNOSIS — R300 Dysuria: Secondary | ICD-10-CM | POA: Diagnosis not present

## 2021-01-12 DIAGNOSIS — Z87410 Personal history of cervical dysplasia: Secondary | ICD-10-CM | POA: Diagnosis not present

## 2021-01-12 DIAGNOSIS — N951 Menopausal and female climacteric states: Secondary | ICD-10-CM | POA: Diagnosis not present

## 2021-01-12 DIAGNOSIS — N898 Other specified noninflammatory disorders of vagina: Secondary | ICD-10-CM | POA: Diagnosis not present

## 2021-01-12 DIAGNOSIS — Z9189 Other specified personal risk factors, not elsewhere classified: Secondary | ICD-10-CM | POA: Diagnosis not present

## 2021-01-13 ENCOUNTER — Other Ambulatory Visit (HOSPITAL_COMMUNITY): Payer: Self-pay

## 2021-01-18 DIAGNOSIS — F331 Major depressive disorder, recurrent, moderate: Secondary | ICD-10-CM | POA: Diagnosis not present

## 2021-01-19 DIAGNOSIS — R350 Frequency of micturition: Secondary | ICD-10-CM | POA: Diagnosis not present

## 2021-01-19 DIAGNOSIS — Z1231 Encounter for screening mammogram for malignant neoplasm of breast: Secondary | ICD-10-CM | POA: Diagnosis not present

## 2021-01-19 DIAGNOSIS — R102 Pelvic and perineal pain: Secondary | ICD-10-CM | POA: Diagnosis not present

## 2021-01-19 DIAGNOSIS — N898 Other specified noninflammatory disorders of vagina: Secondary | ICD-10-CM | POA: Diagnosis not present

## 2021-02-02 DIAGNOSIS — H02831 Dermatochalasis of right upper eyelid: Secondary | ICD-10-CM | POA: Diagnosis not present

## 2021-02-02 DIAGNOSIS — H02834 Dermatochalasis of left upper eyelid: Secondary | ICD-10-CM | POA: Diagnosis not present

## 2021-02-03 ENCOUNTER — Other Ambulatory Visit (HOSPITAL_COMMUNITY): Payer: Self-pay

## 2021-02-03 MED FILL — Esomeprazole Magnesium Cap Delayed Release 40 MG (Base Eq): ORAL | 90 days supply | Qty: 90 | Fill #1 | Status: AC

## 2021-02-04 ENCOUNTER — Other Ambulatory Visit (HOSPITAL_COMMUNITY): Payer: Self-pay

## 2021-02-14 DIAGNOSIS — F331 Major depressive disorder, recurrent, moderate: Secondary | ICD-10-CM | POA: Diagnosis not present

## 2021-02-16 ENCOUNTER — Other Ambulatory Visit (HOSPITAL_COMMUNITY): Payer: Self-pay

## 2021-02-16 MED FILL — Esomeprazole Magnesium Cap Delayed Release 40 MG (Base Eq): ORAL | 90 days supply | Qty: 90 | Fill #2 | Status: CN

## 2021-02-17 ENCOUNTER — Other Ambulatory Visit (HOSPITAL_COMMUNITY): Payer: Self-pay

## 2021-02-25 ENCOUNTER — Other Ambulatory Visit (HOSPITAL_COMMUNITY): Payer: Self-pay

## 2021-02-28 ENCOUNTER — Other Ambulatory Visit (HOSPITAL_COMMUNITY): Payer: Self-pay

## 2021-03-04 ENCOUNTER — Other Ambulatory Visit (HOSPITAL_COMMUNITY): Payer: Self-pay

## 2021-03-04 MED FILL — Escitalopram Oxalate Tab 20 MG (Base Equiv): ORAL | 90 days supply | Qty: 90 | Fill #1 | Status: AC

## 2021-03-04 MED FILL — Acyclovir Tab 400 MG: ORAL | 90 days supply | Qty: 90 | Fill #1 | Status: AC

## 2021-03-05 ENCOUNTER — Other Ambulatory Visit (HOSPITAL_COMMUNITY): Payer: Self-pay

## 2021-03-07 ENCOUNTER — Other Ambulatory Visit (HOSPITAL_COMMUNITY): Payer: Self-pay

## 2021-03-08 ENCOUNTER — Other Ambulatory Visit (HOSPITAL_COMMUNITY): Payer: Self-pay

## 2021-03-10 ENCOUNTER — Other Ambulatory Visit (HOSPITAL_COMMUNITY): Payer: Self-pay

## 2021-03-10 DIAGNOSIS — N898 Other specified noninflammatory disorders of vagina: Secondary | ICD-10-CM | POA: Diagnosis not present

## 2021-03-22 DIAGNOSIS — H02831 Dermatochalasis of right upper eyelid: Secondary | ICD-10-CM | POA: Diagnosis not present

## 2021-03-22 DIAGNOSIS — H02834 Dermatochalasis of left upper eyelid: Secondary | ICD-10-CM | POA: Diagnosis not present

## 2021-03-22 DIAGNOSIS — Z01818 Encounter for other preprocedural examination: Secondary | ICD-10-CM | POA: Diagnosis not present

## 2021-03-22 DIAGNOSIS — H0259 Other disorders affecting eyelid function: Secondary | ICD-10-CM | POA: Diagnosis not present

## 2021-03-24 ENCOUNTER — Other Ambulatory Visit (HOSPITAL_COMMUNITY): Payer: Self-pay

## 2021-03-24 MED ORDER — ROSUVASTATIN CALCIUM 20 MG PO TABS
20.0000 mg | ORAL_TABLET | Freq: Every day | ORAL | 1 refills | Status: DC
  Filled 2021-03-24: qty 90, 90d supply, fill #0
  Filled 2021-06-06: qty 90, 90d supply, fill #1

## 2021-03-25 ENCOUNTER — Other Ambulatory Visit (HOSPITAL_COMMUNITY): Payer: Self-pay

## 2021-04-07 DIAGNOSIS — M2011 Hallux valgus (acquired), right foot: Secondary | ICD-10-CM | POA: Diagnosis not present

## 2021-04-07 DIAGNOSIS — M2012 Hallux valgus (acquired), left foot: Secondary | ICD-10-CM | POA: Diagnosis not present

## 2021-04-07 DIAGNOSIS — M2010 Hallux valgus (acquired), unspecified foot: Secondary | ICD-10-CM | POA: Diagnosis not present

## 2021-04-15 DIAGNOSIS — Z4509 Encounter for adjustment and management of other cardiac device: Secondary | ICD-10-CM | POA: Diagnosis not present

## 2021-04-15 DIAGNOSIS — L821 Other seborrheic keratosis: Secondary | ICD-10-CM | POA: Diagnosis not present

## 2021-04-15 DIAGNOSIS — L718 Other rosacea: Secondary | ICD-10-CM | POA: Diagnosis not present

## 2021-04-15 DIAGNOSIS — L570 Actinic keratosis: Secondary | ICD-10-CM | POA: Diagnosis not present

## 2021-04-15 DIAGNOSIS — L0212 Furuncle of neck: Secondary | ICD-10-CM | POA: Diagnosis not present

## 2021-04-15 DIAGNOSIS — L814 Other melanin hyperpigmentation: Secondary | ICD-10-CM | POA: Diagnosis not present

## 2021-04-17 DIAGNOSIS — Z4509 Encounter for adjustment and management of other cardiac device: Secondary | ICD-10-CM | POA: Diagnosis not present

## 2021-04-27 DIAGNOSIS — M2012 Hallux valgus (acquired), left foot: Secondary | ICD-10-CM | POA: Diagnosis not present

## 2021-04-27 DIAGNOSIS — M2011 Hallux valgus (acquired), right foot: Secondary | ICD-10-CM | POA: Diagnosis not present

## 2021-04-27 DIAGNOSIS — M2010 Hallux valgus (acquired), unspecified foot: Secondary | ICD-10-CM | POA: Diagnosis not present

## 2021-04-28 ENCOUNTER — Other Ambulatory Visit (HOSPITAL_COMMUNITY): Payer: Self-pay

## 2021-04-29 DIAGNOSIS — M47816 Spondylosis without myelopathy or radiculopathy, lumbar region: Secondary | ICD-10-CM | POA: Diagnosis not present

## 2021-04-29 DIAGNOSIS — M25562 Pain in left knee: Secondary | ICD-10-CM | POA: Diagnosis not present

## 2021-04-29 DIAGNOSIS — S8002XA Contusion of left knee, initial encounter: Secondary | ICD-10-CM | POA: Diagnosis not present

## 2021-04-29 DIAGNOSIS — S7002XA Contusion of left hip, initial encounter: Secondary | ICD-10-CM | POA: Diagnosis not present

## 2021-04-29 DIAGNOSIS — M25552 Pain in left hip: Secondary | ICD-10-CM | POA: Diagnosis not present

## 2021-05-13 ENCOUNTER — Other Ambulatory Visit (HOSPITAL_COMMUNITY): Payer: Self-pay

## 2021-05-13 MED FILL — Linaclotide Cap 72 MCG: ORAL | 30 days supply | Qty: 30 | Fill #0 | Status: AC

## 2021-05-19 ENCOUNTER — Other Ambulatory Visit (HOSPITAL_COMMUNITY): Payer: Self-pay

## 2021-05-30 ENCOUNTER — Other Ambulatory Visit (HOSPITAL_COMMUNITY): Payer: Self-pay

## 2021-05-30 DIAGNOSIS — M76821 Posterior tibial tendinitis, right leg: Secondary | ICD-10-CM | POA: Diagnosis not present

## 2021-05-30 DIAGNOSIS — M2011 Hallux valgus (acquired), right foot: Secondary | ICD-10-CM | POA: Diagnosis not present

## 2021-05-30 MED FILL — Escitalopram Oxalate Tab 20 MG (Base Equiv): ORAL | 90 days supply | Qty: 90 | Fill #2 | Status: AC

## 2021-06-06 ENCOUNTER — Other Ambulatory Visit (HOSPITAL_COMMUNITY): Payer: Self-pay

## 2021-06-06 MED FILL — Acyclovir Tab 400 MG: ORAL | 90 days supply | Qty: 90 | Fill #2 | Status: AC

## 2021-07-04 DIAGNOSIS — M76821 Posterior tibial tendinitis, right leg: Secondary | ICD-10-CM | POA: Diagnosis not present

## 2021-07-04 DIAGNOSIS — M2012 Hallux valgus (acquired), left foot: Secondary | ICD-10-CM | POA: Diagnosis not present

## 2021-07-04 DIAGNOSIS — M2010 Hallux valgus (acquired), unspecified foot: Secondary | ICD-10-CM | POA: Diagnosis not present

## 2021-07-04 DIAGNOSIS — M2011 Hallux valgus (acquired), right foot: Secondary | ICD-10-CM | POA: Diagnosis not present

## 2021-07-05 DIAGNOSIS — M1612 Unilateral primary osteoarthritis, left hip: Secondary | ICD-10-CM | POA: Diagnosis not present

## 2021-07-05 DIAGNOSIS — M79606 Pain in leg, unspecified: Secondary | ICD-10-CM | POA: Diagnosis not present

## 2021-07-05 DIAGNOSIS — S5002XA Contusion of left elbow, initial encounter: Secondary | ICD-10-CM | POA: Diagnosis not present

## 2021-07-05 DIAGNOSIS — M79604 Pain in right leg: Secondary | ICD-10-CM | POA: Diagnosis not present

## 2021-07-05 DIAGNOSIS — M25552 Pain in left hip: Secondary | ICD-10-CM | POA: Diagnosis not present

## 2021-07-14 ENCOUNTER — Other Ambulatory Visit (HOSPITAL_COMMUNITY): Payer: Self-pay

## 2021-07-14 DIAGNOSIS — R55 Syncope and collapse: Secondary | ICD-10-CM | POA: Diagnosis not present

## 2021-07-14 DIAGNOSIS — Z959 Presence of cardiac and vascular implant and graft, unspecified: Secondary | ICD-10-CM | POA: Diagnosis not present

## 2021-07-14 DIAGNOSIS — R002 Palpitations: Secondary | ICD-10-CM | POA: Diagnosis not present

## 2021-07-14 DIAGNOSIS — R61 Generalized hyperhidrosis: Secondary | ICD-10-CM | POA: Diagnosis not present

## 2021-07-14 DIAGNOSIS — G909 Disorder of the autonomic nervous system, unspecified: Secondary | ICD-10-CM | POA: Diagnosis not present

## 2021-07-14 MED ORDER — GLYCOPYRROLATE 1 MG PO TABS
1.0000 mg | ORAL_TABLET | Freq: Every day | ORAL | 3 refills | Status: AC
  Filled 2021-07-14 (×2): qty 90, 90d supply, fill #0
  Filled 2021-09-26 – 2021-10-21 (×3): qty 90, 90d supply, fill #1

## 2021-07-14 MED ORDER — NEBIVOLOL HCL 10 MG PO TABS
10.0000 mg | ORAL_TABLET | Freq: Every day | ORAL | 3 refills | Status: AC
  Filled 2021-07-14 (×2): qty 90, 90d supply, fill #0
  Filled 2021-10-09 – 2021-10-21 (×2): qty 90, 90d supply, fill #1
  Filled 2022-02-02: qty 90, 90d supply, fill #2

## 2021-07-28 ENCOUNTER — Other Ambulatory Visit (HOSPITAL_COMMUNITY): Payer: Self-pay

## 2021-07-28 DIAGNOSIS — I5042 Chronic combined systolic (congestive) and diastolic (congestive) heart failure: Secondary | ICD-10-CM | POA: Diagnosis not present

## 2021-07-28 DIAGNOSIS — G4761 Periodic limb movement disorder: Secondary | ICD-10-CM | POA: Diagnosis not present

## 2021-07-28 DIAGNOSIS — G8929 Other chronic pain: Secondary | ICD-10-CM | POA: Diagnosis not present

## 2021-07-28 DIAGNOSIS — R7301 Impaired fasting glucose: Secondary | ICD-10-CM | POA: Diagnosis not present

## 2021-07-28 DIAGNOSIS — M5416 Radiculopathy, lumbar region: Secondary | ICD-10-CM | POA: Diagnosis not present

## 2021-07-28 DIAGNOSIS — K219 Gastro-esophageal reflux disease without esophagitis: Secondary | ICD-10-CM | POA: Diagnosis not present

## 2021-07-28 DIAGNOSIS — E039 Hypothyroidism, unspecified: Secondary | ICD-10-CM | POA: Diagnosis not present

## 2021-07-28 DIAGNOSIS — E559 Vitamin D deficiency, unspecified: Secondary | ICD-10-CM | POA: Diagnosis not present

## 2021-07-28 DIAGNOSIS — D3610 Benign neoplasm of peripheral nerves and autonomic nervous system, unspecified: Secondary | ICD-10-CM | POA: Diagnosis not present

## 2021-07-28 DIAGNOSIS — Z85828 Personal history of other malignant neoplasm of skin: Secondary | ICD-10-CM | POA: Diagnosis not present

## 2021-07-28 MED ORDER — TRIAMCINOLONE ACETONIDE 0.1 % EX CREA
TOPICAL_CREAM | CUTANEOUS | 1 refills | Status: AC
  Filled 2021-07-28: qty 30, 14d supply, fill #0

## 2021-07-29 DIAGNOSIS — S93491A Sprain of other ligament of right ankle, initial encounter: Secondary | ICD-10-CM | POA: Diagnosis not present

## 2021-08-04 DIAGNOSIS — M47816 Spondylosis without myelopathy or radiculopathy, lumbar region: Secondary | ICD-10-CM | POA: Diagnosis not present

## 2021-08-04 DIAGNOSIS — M5127 Other intervertebral disc displacement, lumbosacral region: Secondary | ICD-10-CM | POA: Diagnosis not present

## 2021-08-04 DIAGNOSIS — D3610 Benign neoplasm of peripheral nerves and autonomic nervous system, unspecified: Secondary | ICD-10-CM | POA: Diagnosis not present

## 2021-08-04 DIAGNOSIS — M48061 Spinal stenosis, lumbar region without neurogenic claudication: Secondary | ICD-10-CM | POA: Diagnosis not present

## 2021-08-04 DIAGNOSIS — M4316 Spondylolisthesis, lumbar region: Secondary | ICD-10-CM | POA: Diagnosis not present

## 2021-08-10 ENCOUNTER — Other Ambulatory Visit (HOSPITAL_COMMUNITY): Payer: Self-pay

## 2021-08-10 DIAGNOSIS — M48061 Spinal stenosis, lumbar region without neurogenic claudication: Secondary | ICD-10-CM | POA: Diagnosis not present

## 2021-08-10 DIAGNOSIS — M5442 Lumbago with sciatica, left side: Secondary | ICD-10-CM | POA: Diagnosis not present

## 2021-08-10 DIAGNOSIS — M5441 Lumbago with sciatica, right side: Secondary | ICD-10-CM | POA: Diagnosis not present

## 2021-08-10 DIAGNOSIS — D3610 Benign neoplasm of peripheral nerves and autonomic nervous system, unspecified: Secondary | ICD-10-CM | POA: Diagnosis not present

## 2021-08-10 DIAGNOSIS — M4316 Spondylolisthesis, lumbar region: Secondary | ICD-10-CM | POA: Diagnosis not present

## 2021-08-10 DIAGNOSIS — M47816 Spondylosis without myelopathy or radiculopathy, lumbar region: Secondary | ICD-10-CM | POA: Diagnosis not present

## 2021-08-10 DIAGNOSIS — M545 Low back pain, unspecified: Secondary | ICD-10-CM | POA: Diagnosis not present

## 2021-08-10 DIAGNOSIS — M5136 Other intervertebral disc degeneration, lumbar region: Secondary | ICD-10-CM | POA: Diagnosis not present

## 2021-08-10 MED ORDER — METHYLPREDNISOLONE 4 MG PO TBPK
ORAL_TABLET | ORAL | 0 refills | Status: AC
  Filled 2021-08-10: qty 21, 6d supply, fill #0

## 2021-08-10 MED ORDER — GABAPENTIN 300 MG PO CAPS
ORAL_CAPSULE | ORAL | 1 refills | Status: AC
  Filled 2021-08-10: qty 90, 30d supply, fill #0
  Filled 2021-09-26: qty 90, 30d supply, fill #1

## 2021-08-11 DIAGNOSIS — M25571 Pain in right ankle and joints of right foot: Secondary | ICD-10-CM | POA: Diagnosis not present

## 2021-08-11 DIAGNOSIS — M25561 Pain in right knee: Secondary | ICD-10-CM | POA: Diagnosis not present

## 2021-08-11 DIAGNOSIS — S93401D Sprain of unspecified ligament of right ankle, subsequent encounter: Secondary | ICD-10-CM | POA: Diagnosis not present

## 2021-08-11 DIAGNOSIS — M25552 Pain in left hip: Secondary | ICD-10-CM | POA: Diagnosis not present

## 2021-08-12 ENCOUNTER — Other Ambulatory Visit (HOSPITAL_COMMUNITY): Payer: Self-pay

## 2021-08-12 DIAGNOSIS — S93491D Sprain of other ligament of right ankle, subsequent encounter: Secondary | ICD-10-CM | POA: Diagnosis not present

## 2021-08-18 DIAGNOSIS — M25561 Pain in right knee: Secondary | ICD-10-CM | POA: Diagnosis not present

## 2021-08-18 DIAGNOSIS — M25552 Pain in left hip: Secondary | ICD-10-CM | POA: Diagnosis not present

## 2021-08-18 DIAGNOSIS — S93401D Sprain of unspecified ligament of right ankle, subsequent encounter: Secondary | ICD-10-CM | POA: Diagnosis not present

## 2021-08-18 DIAGNOSIS — M25571 Pain in right ankle and joints of right foot: Secondary | ICD-10-CM | POA: Diagnosis not present

## 2021-08-19 DIAGNOSIS — S83281A Other tear of lateral meniscus, current injury, right knee, initial encounter: Secondary | ICD-10-CM | POA: Diagnosis not present

## 2021-08-22 DIAGNOSIS — M25561 Pain in right knee: Secondary | ICD-10-CM | POA: Diagnosis not present

## 2021-08-22 DIAGNOSIS — S93401D Sprain of unspecified ligament of right ankle, subsequent encounter: Secondary | ICD-10-CM | POA: Diagnosis not present

## 2021-08-22 DIAGNOSIS — M25571 Pain in right ankle and joints of right foot: Secondary | ICD-10-CM | POA: Diagnosis not present

## 2021-08-22 DIAGNOSIS — M25552 Pain in left hip: Secondary | ICD-10-CM | POA: Diagnosis not present

## 2021-08-24 DIAGNOSIS — M5442 Lumbago with sciatica, left side: Secondary | ICD-10-CM | POA: Diagnosis not present

## 2021-08-24 DIAGNOSIS — D3610 Benign neoplasm of peripheral nerves and autonomic nervous system, unspecified: Secondary | ICD-10-CM | POA: Diagnosis not present

## 2021-08-24 DIAGNOSIS — M5441 Lumbago with sciatica, right side: Secondary | ICD-10-CM | POA: Diagnosis not present

## 2021-08-24 DIAGNOSIS — M4316 Spondylolisthesis, lumbar region: Secondary | ICD-10-CM | POA: Diagnosis not present

## 2021-08-28 ENCOUNTER — Other Ambulatory Visit (HOSPITAL_COMMUNITY): Payer: Self-pay

## 2021-08-29 ENCOUNTER — Other Ambulatory Visit (HOSPITAL_COMMUNITY): Payer: Self-pay

## 2021-08-29 DIAGNOSIS — M25552 Pain in left hip: Secondary | ICD-10-CM | POA: Diagnosis not present

## 2021-08-29 DIAGNOSIS — M25561 Pain in right knee: Secondary | ICD-10-CM | POA: Diagnosis not present

## 2021-08-29 DIAGNOSIS — S93401D Sprain of unspecified ligament of right ankle, subsequent encounter: Secondary | ICD-10-CM | POA: Diagnosis not present

## 2021-08-29 DIAGNOSIS — M25571 Pain in right ankle and joints of right foot: Secondary | ICD-10-CM | POA: Diagnosis not present

## 2021-08-29 MED ORDER — ROSUVASTATIN CALCIUM 20 MG PO TABS
ORAL_TABLET | ORAL | 0 refills | Status: DC
  Filled 2021-08-29: qty 90, 90d supply, fill #0

## 2021-08-30 ENCOUNTER — Other Ambulatory Visit (HOSPITAL_COMMUNITY): Payer: Self-pay

## 2021-08-30 DIAGNOSIS — M25521 Pain in right elbow: Secondary | ICD-10-CM | POA: Diagnosis not present

## 2021-08-30 DIAGNOSIS — R609 Edema, unspecified: Secondary | ICD-10-CM | POA: Diagnosis not present

## 2021-08-30 DIAGNOSIS — M25461 Effusion, right knee: Secondary | ICD-10-CM | POA: Diagnosis not present

## 2021-08-30 DIAGNOSIS — S83281A Other tear of lateral meniscus, current injury, right knee, initial encounter: Secondary | ICD-10-CM | POA: Diagnosis not present

## 2021-08-31 ENCOUNTER — Other Ambulatory Visit (HOSPITAL_COMMUNITY): Payer: Self-pay

## 2021-09-01 DIAGNOSIS — M5451 Vertebrogenic low back pain: Secondary | ICD-10-CM | POA: Diagnosis not present

## 2021-09-01 DIAGNOSIS — M5416 Radiculopathy, lumbar region: Secondary | ICD-10-CM | POA: Diagnosis not present

## 2021-09-01 DIAGNOSIS — M47816 Spondylosis without myelopathy or radiculopathy, lumbar region: Secondary | ICD-10-CM | POA: Diagnosis not present

## 2021-09-05 DIAGNOSIS — M1711 Unilateral primary osteoarthritis, right knee: Secondary | ICD-10-CM | POA: Diagnosis not present

## 2021-09-05 DIAGNOSIS — M222X9 Patellofemoral disorders, unspecified knee: Secondary | ICD-10-CM | POA: Diagnosis not present

## 2021-09-06 ENCOUNTER — Other Ambulatory Visit (HOSPITAL_COMMUNITY): Payer: Self-pay

## 2021-09-06 MED ORDER — ESOMEPRAZOLE MAGNESIUM 40 MG PO CPDR
DELAYED_RELEASE_CAPSULE | ORAL | 0 refills | Status: DC
  Filled 2021-09-06: qty 90, 90d supply, fill #0

## 2021-09-07 ENCOUNTER — Other Ambulatory Visit (HOSPITAL_COMMUNITY): Payer: Self-pay

## 2021-09-08 ENCOUNTER — Other Ambulatory Visit (HOSPITAL_COMMUNITY): Payer: Self-pay

## 2021-09-08 MED ORDER — LEVOTHYROXINE SODIUM 75 MCG PO TABS
ORAL_TABLET | ORAL | 1 refills | Status: DC
  Filled 2021-09-08: qty 90, fill #0
  Filled 2021-11-09: qty 90, 90d supply, fill #0
  Filled 2022-02-02: qty 90, 90d supply, fill #1

## 2021-09-09 DIAGNOSIS — M25552 Pain in left hip: Secondary | ICD-10-CM | POA: Diagnosis not present

## 2021-09-09 DIAGNOSIS — S93401D Sprain of unspecified ligament of right ankle, subsequent encounter: Secondary | ICD-10-CM | POA: Diagnosis not present

## 2021-09-09 DIAGNOSIS — M25561 Pain in right knee: Secondary | ICD-10-CM | POA: Diagnosis not present

## 2021-09-09 DIAGNOSIS — M25571 Pain in right ankle and joints of right foot: Secondary | ICD-10-CM | POA: Diagnosis not present

## 2021-09-13 DIAGNOSIS — M25571 Pain in right ankle and joints of right foot: Secondary | ICD-10-CM | POA: Diagnosis not present

## 2021-09-13 DIAGNOSIS — S93401D Sprain of unspecified ligament of right ankle, subsequent encounter: Secondary | ICD-10-CM | POA: Diagnosis not present

## 2021-09-13 DIAGNOSIS — M25561 Pain in right knee: Secondary | ICD-10-CM | POA: Diagnosis not present

## 2021-09-13 DIAGNOSIS — M25552 Pain in left hip: Secondary | ICD-10-CM | POA: Diagnosis not present

## 2021-09-16 DIAGNOSIS — Z760 Encounter for issue of repeat prescription: Secondary | ICD-10-CM | POA: Diagnosis not present

## 2021-09-19 ENCOUNTER — Other Ambulatory Visit (HOSPITAL_COMMUNITY): Payer: Self-pay

## 2021-09-19 DIAGNOSIS — M25561 Pain in right knee: Secondary | ICD-10-CM | POA: Diagnosis not present

## 2021-09-19 DIAGNOSIS — M25571 Pain in right ankle and joints of right foot: Secondary | ICD-10-CM | POA: Diagnosis not present

## 2021-09-19 DIAGNOSIS — S93401D Sprain of unspecified ligament of right ankle, subsequent encounter: Secondary | ICD-10-CM | POA: Diagnosis not present

## 2021-09-19 DIAGNOSIS — M25552 Pain in left hip: Secondary | ICD-10-CM | POA: Diagnosis not present

## 2021-09-22 DIAGNOSIS — L981 Factitial dermatitis: Secondary | ICD-10-CM | POA: Diagnosis not present

## 2021-09-22 DIAGNOSIS — L089 Local infection of the skin and subcutaneous tissue, unspecified: Secondary | ICD-10-CM | POA: Diagnosis not present

## 2021-09-22 DIAGNOSIS — D485 Neoplasm of uncertain behavior of skin: Secondary | ICD-10-CM | POA: Diagnosis not present

## 2021-09-26 ENCOUNTER — Other Ambulatory Visit (HOSPITAL_COMMUNITY): Payer: Self-pay

## 2021-09-26 DIAGNOSIS — M25571 Pain in right ankle and joints of right foot: Secondary | ICD-10-CM | POA: Diagnosis not present

## 2021-09-26 DIAGNOSIS — G8929 Other chronic pain: Secondary | ICD-10-CM | POA: Diagnosis not present

## 2021-09-26 DIAGNOSIS — M25552 Pain in left hip: Secondary | ICD-10-CM | POA: Diagnosis not present

## 2021-09-26 DIAGNOSIS — D3610 Benign neoplasm of peripheral nerves and autonomic nervous system, unspecified: Secondary | ICD-10-CM | POA: Diagnosis not present

## 2021-09-26 DIAGNOSIS — S93401D Sprain of unspecified ligament of right ankle, subsequent encounter: Secondary | ICD-10-CM | POA: Diagnosis not present

## 2021-09-26 DIAGNOSIS — M48061 Spinal stenosis, lumbar region without neurogenic claudication: Secondary | ICD-10-CM | POA: Diagnosis not present

## 2021-09-26 DIAGNOSIS — M25561 Pain in right knee: Secondary | ICD-10-CM | POA: Diagnosis not present

## 2021-09-26 DIAGNOSIS — M4316 Spondylolisthesis, lumbar region: Secondary | ICD-10-CM | POA: Diagnosis not present

## 2021-09-26 DIAGNOSIS — M5441 Lumbago with sciatica, right side: Secondary | ICD-10-CM | POA: Diagnosis not present

## 2021-09-27 ENCOUNTER — Other Ambulatory Visit (HOSPITAL_COMMUNITY): Payer: Self-pay

## 2021-09-28 ENCOUNTER — Other Ambulatory Visit (HOSPITAL_COMMUNITY): Payer: Self-pay

## 2021-10-05 ENCOUNTER — Other Ambulatory Visit (HOSPITAL_COMMUNITY): Payer: Self-pay

## 2021-10-05 DIAGNOSIS — S93401D Sprain of unspecified ligament of right ankle, subsequent encounter: Secondary | ICD-10-CM | POA: Diagnosis not present

## 2021-10-05 DIAGNOSIS — M25561 Pain in right knee: Secondary | ICD-10-CM | POA: Diagnosis not present

## 2021-10-05 DIAGNOSIS — M76821 Posterior tibial tendinitis, right leg: Secondary | ICD-10-CM | POA: Diagnosis not present

## 2021-10-05 DIAGNOSIS — M25552 Pain in left hip: Secondary | ICD-10-CM | POA: Diagnosis not present

## 2021-10-05 DIAGNOSIS — M25571 Pain in right ankle and joints of right foot: Secondary | ICD-10-CM | POA: Diagnosis not present

## 2021-10-06 DIAGNOSIS — M5416 Radiculopathy, lumbar region: Secondary | ICD-10-CM | POA: Diagnosis not present

## 2021-10-10 ENCOUNTER — Other Ambulatory Visit (HOSPITAL_COMMUNITY): Payer: Self-pay

## 2021-10-13 DIAGNOSIS — L814 Other melanin hyperpigmentation: Secondary | ICD-10-CM | POA: Diagnosis not present

## 2021-10-13 DIAGNOSIS — M25571 Pain in right ankle and joints of right foot: Secondary | ICD-10-CM | POA: Diagnosis not present

## 2021-10-13 DIAGNOSIS — D225 Melanocytic nevi of trunk: Secondary | ICD-10-CM | POA: Diagnosis not present

## 2021-10-13 DIAGNOSIS — M25561 Pain in right knee: Secondary | ICD-10-CM | POA: Diagnosis not present

## 2021-10-13 DIAGNOSIS — S93401D Sprain of unspecified ligament of right ankle, subsequent encounter: Secondary | ICD-10-CM | POA: Diagnosis not present

## 2021-10-13 DIAGNOSIS — L821 Other seborrheic keratosis: Secondary | ICD-10-CM | POA: Diagnosis not present

## 2021-10-13 DIAGNOSIS — L578 Other skin changes due to chronic exposure to nonionizing radiation: Secondary | ICD-10-CM | POA: Diagnosis not present

## 2021-10-13 DIAGNOSIS — L810 Postinflammatory hyperpigmentation: Secondary | ICD-10-CM | POA: Diagnosis not present

## 2021-10-13 DIAGNOSIS — M25552 Pain in left hip: Secondary | ICD-10-CM | POA: Diagnosis not present

## 2021-10-13 DIAGNOSIS — L815 Leukoderma, not elsewhere classified: Secondary | ICD-10-CM | POA: Diagnosis not present

## 2021-10-14 DIAGNOSIS — M5416 Radiculopathy, lumbar region: Secondary | ICD-10-CM | POA: Diagnosis not present

## 2021-10-14 DIAGNOSIS — M4310 Spondylolisthesis, site unspecified: Secondary | ICD-10-CM | POA: Diagnosis not present

## 2021-10-21 ENCOUNTER — Other Ambulatory Visit (HOSPITAL_COMMUNITY): Payer: Self-pay

## 2021-10-24 ENCOUNTER — Other Ambulatory Visit (HOSPITAL_COMMUNITY): Payer: Self-pay

## 2021-10-24 DIAGNOSIS — M4807 Spinal stenosis, lumbosacral region: Secondary | ICD-10-CM | POA: Diagnosis not present

## 2021-10-24 DIAGNOSIS — M5416 Radiculopathy, lumbar region: Secondary | ICD-10-CM | POA: Diagnosis not present

## 2021-10-24 DIAGNOSIS — M4310 Spondylolisthesis, site unspecified: Secondary | ICD-10-CM | POA: Diagnosis not present

## 2021-10-24 MED ORDER — ACYCLOVIR 400 MG PO TABS
400.0000 mg | ORAL_TABLET | Freq: Every day | ORAL | 0 refills | Status: DC
  Filled 2021-10-24: qty 90, 90d supply, fill #0

## 2021-11-07 DIAGNOSIS — Z959 Presence of cardiac and vascular implant and graft, unspecified: Secondary | ICD-10-CM | POA: Diagnosis not present

## 2021-11-08 DIAGNOSIS — D492 Neoplasm of unspecified behavior of bone, soft tissue, and skin: Secondary | ICD-10-CM | POA: Diagnosis not present

## 2021-11-08 DIAGNOSIS — M4316 Spondylolisthesis, lumbar region: Secondary | ICD-10-CM | POA: Diagnosis not present

## 2021-11-09 ENCOUNTER — Other Ambulatory Visit (HOSPITAL_COMMUNITY): Payer: Self-pay

## 2021-11-09 MED ORDER — ESOMEPRAZOLE MAGNESIUM 40 MG PO CPDR
DELAYED_RELEASE_CAPSULE | ORAL | 0 refills | Status: DC
  Filled 2021-11-09 – 2021-11-21 (×2): qty 90, 90d supply, fill #0

## 2021-11-13 DIAGNOSIS — M25561 Pain in right knee: Secondary | ICD-10-CM | POA: Diagnosis not present

## 2021-11-14 ENCOUNTER — Other Ambulatory Visit (HOSPITAL_COMMUNITY): Payer: Self-pay

## 2021-11-14 DIAGNOSIS — M76821 Posterior tibial tendinitis, right leg: Secondary | ICD-10-CM | POA: Diagnosis not present

## 2021-11-14 DIAGNOSIS — Z959 Presence of cardiac and vascular implant and graft, unspecified: Secondary | ICD-10-CM | POA: Diagnosis not present

## 2021-11-17 DIAGNOSIS — M1711 Unilateral primary osteoarthritis, right knee: Secondary | ICD-10-CM | POA: Diagnosis not present

## 2021-11-21 ENCOUNTER — Other Ambulatory Visit (HOSPITAL_COMMUNITY): Payer: Self-pay

## 2021-11-22 ENCOUNTER — Other Ambulatory Visit (HOSPITAL_COMMUNITY): Payer: Self-pay

## 2021-11-22 DIAGNOSIS — M25571 Pain in right ankle and joints of right foot: Secondary | ICD-10-CM | POA: Diagnosis not present

## 2021-11-22 DIAGNOSIS — M25552 Pain in left hip: Secondary | ICD-10-CM | POA: Diagnosis not present

## 2021-11-22 DIAGNOSIS — M25561 Pain in right knee: Secondary | ICD-10-CM | POA: Diagnosis not present

## 2021-11-22 DIAGNOSIS — S93401D Sprain of unspecified ligament of right ankle, subsequent encounter: Secondary | ICD-10-CM | POA: Diagnosis not present

## 2021-11-22 MED ORDER — ACYCLOVIR 400 MG PO TABS
400.0000 mg | ORAL_TABLET | Freq: Every day | ORAL | 0 refills | Status: DC
  Filled 2021-11-22 – 2022-02-02 (×2): qty 90, 90d supply, fill #0

## 2021-11-23 DIAGNOSIS — M79604 Pain in right leg: Secondary | ICD-10-CM | POA: Diagnosis not present

## 2021-11-24 DIAGNOSIS — M25552 Pain in left hip: Secondary | ICD-10-CM | POA: Diagnosis not present

## 2021-11-24 DIAGNOSIS — M25561 Pain in right knee: Secondary | ICD-10-CM | POA: Diagnosis not present

## 2021-11-24 DIAGNOSIS — M25571 Pain in right ankle and joints of right foot: Secondary | ICD-10-CM | POA: Diagnosis not present

## 2021-11-24 DIAGNOSIS — M1711 Unilateral primary osteoarthritis, right knee: Secondary | ICD-10-CM | POA: Diagnosis not present

## 2021-11-24 DIAGNOSIS — S93401D Sprain of unspecified ligament of right ankle, subsequent encounter: Secondary | ICD-10-CM | POA: Diagnosis not present

## 2021-11-29 DIAGNOSIS — M25561 Pain in right knee: Secondary | ICD-10-CM | POA: Diagnosis not present

## 2021-11-29 DIAGNOSIS — M1711 Unilateral primary osteoarthritis, right knee: Secondary | ICD-10-CM | POA: Diagnosis not present

## 2021-11-29 DIAGNOSIS — M25571 Pain in right ankle and joints of right foot: Secondary | ICD-10-CM | POA: Diagnosis not present

## 2021-11-29 DIAGNOSIS — M25552 Pain in left hip: Secondary | ICD-10-CM | POA: Diagnosis not present

## 2021-11-29 DIAGNOSIS — S93401D Sprain of unspecified ligament of right ankle, subsequent encounter: Secondary | ICD-10-CM | POA: Diagnosis not present

## 2022-01-01 ENCOUNTER — Other Ambulatory Visit (HOSPITAL_COMMUNITY): Payer: Self-pay

## 2022-01-02 ENCOUNTER — Other Ambulatory Visit (HOSPITAL_COMMUNITY): Payer: Self-pay

## 2022-01-02 MED ORDER — ROSUVASTATIN CALCIUM 20 MG PO TABS
20.0000 mg | ORAL_TABLET | Freq: Every day | ORAL | 3 refills | Status: AC
  Filled 2022-01-02: qty 90, 90d supply, fill #0
  Filled 2022-04-19: qty 90, 90d supply, fill #1

## 2022-01-03 DIAGNOSIS — M1711 Unilateral primary osteoarthritis, right knee: Secondary | ICD-10-CM | POA: Diagnosis not present

## 2022-01-04 DIAGNOSIS — D0462 Carcinoma in situ of skin of left upper limb, including shoulder: Secondary | ICD-10-CM | POA: Diagnosis not present

## 2022-01-04 DIAGNOSIS — D692 Other nonthrombocytopenic purpura: Secondary | ICD-10-CM | POA: Diagnosis not present

## 2022-01-04 DIAGNOSIS — L718 Other rosacea: Secondary | ICD-10-CM | POA: Diagnosis not present

## 2022-01-04 DIAGNOSIS — D485 Neoplasm of uncertain behavior of skin: Secondary | ICD-10-CM | POA: Diagnosis not present

## 2022-01-10 DIAGNOSIS — M1711 Unilateral primary osteoarthritis, right knee: Secondary | ICD-10-CM | POA: Diagnosis not present

## 2022-01-18 DIAGNOSIS — C44629 Squamous cell carcinoma of skin of left upper limb, including shoulder: Secondary | ICD-10-CM | POA: Diagnosis not present

## 2022-01-18 DIAGNOSIS — L821 Other seborrheic keratosis: Secondary | ICD-10-CM | POA: Diagnosis not present

## 2022-01-18 DIAGNOSIS — L814 Other melanin hyperpigmentation: Secondary | ICD-10-CM | POA: Diagnosis not present

## 2022-01-24 DIAGNOSIS — M1711 Unilateral primary osteoarthritis, right knee: Secondary | ICD-10-CM | POA: Diagnosis not present

## 2022-02-01 DIAGNOSIS — M4310 Spondylolisthesis, site unspecified: Secondary | ICD-10-CM | POA: Diagnosis not present

## 2022-02-01 DIAGNOSIS — M5416 Radiculopathy, lumbar region: Secondary | ICD-10-CM | POA: Diagnosis not present

## 2022-02-02 ENCOUNTER — Other Ambulatory Visit (HOSPITAL_COMMUNITY): Payer: Self-pay

## 2022-02-02 ENCOUNTER — Other Ambulatory Visit (HOSPITAL_BASED_OUTPATIENT_CLINIC_OR_DEPARTMENT_OTHER): Payer: Self-pay

## 2022-02-03 ENCOUNTER — Other Ambulatory Visit (HOSPITAL_COMMUNITY): Payer: Self-pay

## 2022-02-03 MED ORDER — ESTRADIOL 0.05 MG/24HR TD PTTW
MEDICATED_PATCH | TRANSDERMAL | 3 refills | Status: AC
  Filled 2022-02-03: qty 24, 84d supply, fill #0
  Filled 2022-04-19: qty 24, 84d supply, fill #1

## 2022-02-06 ENCOUNTER — Other Ambulatory Visit (HOSPITAL_COMMUNITY): Payer: Self-pay

## 2022-02-07 ENCOUNTER — Other Ambulatory Visit (HOSPITAL_COMMUNITY): Payer: Self-pay

## 2022-02-07 DIAGNOSIS — M25552 Pain in left hip: Secondary | ICD-10-CM | POA: Diagnosis not present

## 2022-02-07 DIAGNOSIS — M2550 Pain in unspecified joint: Secondary | ICD-10-CM | POA: Diagnosis not present

## 2022-02-07 DIAGNOSIS — M1711 Unilateral primary osteoarthritis, right knee: Secondary | ICD-10-CM | POA: Diagnosis not present

## 2022-02-15 DIAGNOSIS — L982 Febrile neutrophilic dermatosis [Sweet]: Secondary | ICD-10-CM | POA: Diagnosis not present

## 2022-02-15 DIAGNOSIS — M2550 Pain in unspecified joint: Secondary | ICD-10-CM | POA: Diagnosis not present

## 2022-02-16 ENCOUNTER — Other Ambulatory Visit (HOSPITAL_COMMUNITY): Payer: Self-pay

## 2022-02-16 DIAGNOSIS — L905 Scar conditions and fibrosis of skin: Secondary | ICD-10-CM | POA: Diagnosis not present

## 2022-02-16 DIAGNOSIS — Z48817 Encounter for surgical aftercare following surgery on the skin and subcutaneous tissue: Secondary | ICD-10-CM | POA: Diagnosis not present

## 2022-02-17 DIAGNOSIS — Z1231 Encounter for screening mammogram for malignant neoplasm of breast: Secondary | ICD-10-CM | POA: Diagnosis not present

## 2022-02-20 ENCOUNTER — Other Ambulatory Visit (HOSPITAL_COMMUNITY): Payer: Self-pay

## 2022-02-21 ENCOUNTER — Other Ambulatory Visit (HOSPITAL_COMMUNITY): Payer: Self-pay

## 2022-02-22 ENCOUNTER — Other Ambulatory Visit (HOSPITAL_COMMUNITY): Payer: Self-pay

## 2022-02-27 ENCOUNTER — Other Ambulatory Visit (HOSPITAL_COMMUNITY): Payer: Self-pay

## 2022-02-27 MED ORDER — ESCITALOPRAM OXALATE 20 MG PO TABS
ORAL_TABLET | ORAL | 2 refills | Status: DC
  Filled 2022-02-27: qty 90, 90d supply, fill #0
  Filled 2022-03-04: qty 90, 90d supply, fill #1

## 2022-02-28 ENCOUNTER — Other Ambulatory Visit (HOSPITAL_COMMUNITY): Payer: Self-pay

## 2022-02-28 DIAGNOSIS — Z959 Presence of cardiac and vascular implant and graft, unspecified: Secondary | ICD-10-CM | POA: Diagnosis not present

## 2022-02-28 DIAGNOSIS — G909 Disorder of the autonomic nervous system, unspecified: Secondary | ICD-10-CM | POA: Diagnosis not present

## 2022-02-28 DIAGNOSIS — R002 Palpitations: Secondary | ICD-10-CM | POA: Diagnosis not present

## 2022-02-28 MED ORDER — ESOMEPRAZOLE MAGNESIUM 40 MG PO CPDR
DELAYED_RELEASE_CAPSULE | ORAL | 3 refills | Status: AC
  Filled 2022-02-28: qty 90, 90d supply, fill #0
  Filled 2022-05-23: qty 90, 90d supply, fill #1

## 2022-03-03 ENCOUNTER — Other Ambulatory Visit (HOSPITAL_COMMUNITY): Payer: Self-pay

## 2022-03-04 ENCOUNTER — Other Ambulatory Visit (HOSPITAL_COMMUNITY): Payer: Self-pay

## 2022-03-10 ENCOUNTER — Other Ambulatory Visit (HOSPITAL_COMMUNITY): Payer: Self-pay

## 2022-03-24 DIAGNOSIS — M25552 Pain in left hip: Secondary | ICD-10-CM | POA: Diagnosis not present

## 2022-03-24 DIAGNOSIS — M1711 Unilateral primary osteoarthritis, right knee: Secondary | ICD-10-CM | POA: Diagnosis not present

## 2022-03-29 ENCOUNTER — Other Ambulatory Visit (HOSPITAL_COMMUNITY): Payer: Self-pay

## 2022-04-11 ENCOUNTER — Other Ambulatory Visit (HOSPITAL_COMMUNITY): Payer: Self-pay

## 2022-04-17 ENCOUNTER — Other Ambulatory Visit (HOSPITAL_COMMUNITY): Payer: Self-pay

## 2022-04-17 DIAGNOSIS — E669 Obesity, unspecified: Secondary | ICD-10-CM | POA: Diagnosis not present

## 2022-04-17 DIAGNOSIS — R635 Abnormal weight gain: Secondary | ICD-10-CM | POA: Diagnosis not present

## 2022-04-17 DIAGNOSIS — F064 Anxiety disorder due to known physiological condition: Secondary | ICD-10-CM | POA: Diagnosis not present

## 2022-04-17 MED ORDER — ESCITALOPRAM OXALATE 5 MG PO TABS
5.0000 mg | ORAL_TABLET | Freq: Every day | ORAL | 0 refills | Status: AC
  Filled 2022-04-17: qty 30, 30d supply, fill #0

## 2022-04-19 ENCOUNTER — Other Ambulatory Visit (HOSPITAL_COMMUNITY): Payer: Self-pay

## 2022-04-24 ENCOUNTER — Other Ambulatory Visit (HOSPITAL_COMMUNITY): Payer: Self-pay

## 2022-04-24 ENCOUNTER — Other Ambulatory Visit: Payer: Self-pay

## 2022-04-26 DIAGNOSIS — I447 Left bundle-branch block, unspecified: Secondary | ICD-10-CM | POA: Diagnosis not present

## 2022-04-26 DIAGNOSIS — Z4509 Encounter for adjustment and management of other cardiac device: Secondary | ICD-10-CM | POA: Diagnosis not present

## 2022-04-26 DIAGNOSIS — Z95818 Presence of other cardiac implants and grafts: Secondary | ICD-10-CM | POA: Diagnosis not present

## 2022-04-26 DIAGNOSIS — R55 Syncope and collapse: Secondary | ICD-10-CM | POA: Diagnosis not present

## 2022-05-16 DIAGNOSIS — Z6829 Body mass index (BMI) 29.0-29.9, adult: Secondary | ICD-10-CM | POA: Diagnosis not present

## 2022-05-16 DIAGNOSIS — E669 Obesity, unspecified: Secondary | ICD-10-CM | POA: Diagnosis not present

## 2022-05-23 ENCOUNTER — Other Ambulatory Visit (HOSPITAL_COMMUNITY): Payer: Self-pay

## 2022-05-23 DIAGNOSIS — M25551 Pain in right hip: Secondary | ICD-10-CM | POA: Diagnosis not present

## 2022-05-23 DIAGNOSIS — M79641 Pain in right hand: Secondary | ICD-10-CM | POA: Diagnosis not present

## 2022-05-23 DIAGNOSIS — M19041 Primary osteoarthritis, right hand: Secondary | ICD-10-CM | POA: Diagnosis not present

## 2022-05-23 DIAGNOSIS — M19042 Primary osteoarthritis, left hand: Secondary | ICD-10-CM | POA: Diagnosis not present

## 2022-05-23 DIAGNOSIS — M19071 Primary osteoarthritis, right ankle and foot: Secondary | ICD-10-CM | POA: Diagnosis not present

## 2022-05-23 DIAGNOSIS — R21 Rash and other nonspecific skin eruption: Secondary | ICD-10-CM | POA: Diagnosis not present

## 2022-05-23 DIAGNOSIS — D804 Selective deficiency of immunoglobulin M [IgM]: Secondary | ICD-10-CM | POA: Diagnosis not present

## 2022-05-23 DIAGNOSIS — M47816 Spondylosis without myelopathy or radiculopathy, lumbar region: Secondary | ICD-10-CM | POA: Diagnosis not present

## 2022-05-23 DIAGNOSIS — M79642 Pain in left hand: Secondary | ICD-10-CM | POA: Diagnosis not present

## 2022-05-23 DIAGNOSIS — D802 Selective deficiency of immunoglobulin A [IgA]: Secondary | ICD-10-CM | POA: Diagnosis not present

## 2022-05-23 DIAGNOSIS — M25552 Pain in left hip: Secondary | ICD-10-CM | POA: Diagnosis not present

## 2022-05-23 DIAGNOSIS — M19032 Primary osteoarthritis, left wrist: Secondary | ICD-10-CM | POA: Diagnosis not present

## 2022-05-23 DIAGNOSIS — M1711 Unilateral primary osteoarthritis, right knee: Secondary | ICD-10-CM | POA: Diagnosis not present

## 2022-05-23 DIAGNOSIS — M19049 Primary osteoarthritis, unspecified hand: Secondary | ICD-10-CM | POA: Diagnosis not present

## 2022-05-23 DIAGNOSIS — M7072 Other bursitis of hip, left hip: Secondary | ICD-10-CM | POA: Diagnosis not present

## 2022-05-23 MED ORDER — LEVOTHYROXINE SODIUM 75 MCG PO TABS
75.0000 ug | ORAL_TABLET | Freq: Every morning | ORAL | 1 refills | Status: AC
  Filled 2022-05-23: qty 90, 90d supply, fill #0

## 2022-05-24 ENCOUNTER — Other Ambulatory Visit (HOSPITAL_COMMUNITY): Payer: Self-pay

## 2022-05-25 DIAGNOSIS — H40013 Open angle with borderline findings, low risk, bilateral: Secondary | ICD-10-CM | POA: Diagnosis not present

## 2022-05-26 ENCOUNTER — Other Ambulatory Visit (HOSPITAL_COMMUNITY): Payer: Self-pay

## 2022-05-29 ENCOUNTER — Other Ambulatory Visit (HOSPITAL_COMMUNITY): Payer: Self-pay

## 2022-05-29 DIAGNOSIS — L821 Other seborrheic keratosis: Secondary | ICD-10-CM | POA: Diagnosis not present

## 2022-05-29 DIAGNOSIS — Z85828 Personal history of other malignant neoplasm of skin: Secondary | ICD-10-CM | POA: Diagnosis not present

## 2022-05-29 DIAGNOSIS — L570 Actinic keratosis: Secondary | ICD-10-CM | POA: Diagnosis not present

## 2022-05-29 DIAGNOSIS — L814 Other melanin hyperpigmentation: Secondary | ICD-10-CM | POA: Diagnosis not present

## 2022-05-29 DIAGNOSIS — L309 Dermatitis, unspecified: Secondary | ICD-10-CM | POA: Diagnosis not present

## 2022-05-29 DIAGNOSIS — D225 Melanocytic nevi of trunk: Secondary | ICD-10-CM | POA: Diagnosis not present

## 2022-05-29 DIAGNOSIS — L298 Other pruritus: Secondary | ICD-10-CM | POA: Diagnosis not present

## 2022-05-29 DIAGNOSIS — Z08 Encounter for follow-up examination after completed treatment for malignant neoplasm: Secondary | ICD-10-CM | POA: Diagnosis not present

## 2022-05-29 DIAGNOSIS — L578 Other skin changes due to chronic exposure to nonionizing radiation: Secondary | ICD-10-CM | POA: Diagnosis not present

## 2022-05-29 MED ORDER — ACYCLOVIR 400 MG PO TABS
400.0000 mg | ORAL_TABLET | Freq: Every day | ORAL | 0 refills | Status: AC
  Filled 2022-05-29: qty 90, 90d supply, fill #0

## 2022-05-30 DIAGNOSIS — E559 Vitamin D deficiency, unspecified: Secondary | ICD-10-CM | POA: Diagnosis not present

## 2022-05-30 DIAGNOSIS — M159 Polyosteoarthritis, unspecified: Secondary | ICD-10-CM | POA: Diagnosis not present

## 2022-05-30 DIAGNOSIS — F411 Generalized anxiety disorder: Secondary | ICD-10-CM | POA: Diagnosis not present

## 2022-05-30 DIAGNOSIS — Z23 Encounter for immunization: Secondary | ICD-10-CM | POA: Diagnosis not present

## 2022-05-30 DIAGNOSIS — Z01818 Encounter for other preprocedural examination: Secondary | ICD-10-CM | POA: Diagnosis not present

## 2022-05-30 DIAGNOSIS — N952 Postmenopausal atrophic vaginitis: Secondary | ICD-10-CM | POA: Diagnosis not present

## 2022-05-30 DIAGNOSIS — E785 Hyperlipidemia, unspecified: Secondary | ICD-10-CM | POA: Diagnosis not present

## 2022-05-30 DIAGNOSIS — E039 Hypothyroidism, unspecified: Secondary | ICD-10-CM | POA: Diagnosis not present

## 2022-05-30 DIAGNOSIS — I10 Essential (primary) hypertension: Secondary | ICD-10-CM | POA: Diagnosis not present

## 2022-05-31 ENCOUNTER — Other Ambulatory Visit (HOSPITAL_COMMUNITY): Payer: Self-pay

## 2022-06-01 ENCOUNTER — Other Ambulatory Visit (HOSPITAL_COMMUNITY): Payer: Self-pay

## 2022-06-05 DIAGNOSIS — R000 Tachycardia, unspecified: Secondary | ICD-10-CM | POA: Diagnosis not present

## 2022-06-05 DIAGNOSIS — R6889 Other general symptoms and signs: Secondary | ICD-10-CM | POA: Diagnosis not present

## 2022-06-05 DIAGNOSIS — R55 Syncope and collapse: Secondary | ICD-10-CM | POA: Diagnosis not present

## 2022-06-05 DIAGNOSIS — I471 Supraventricular tachycardia, unspecified: Secondary | ICD-10-CM | POA: Diagnosis not present

## 2022-06-06 ENCOUNTER — Other Ambulatory Visit (HOSPITAL_COMMUNITY): Payer: Self-pay

## 2022-06-21 DIAGNOSIS — R350 Frequency of micturition: Secondary | ICD-10-CM | POA: Diagnosis not present

## 2022-06-21 DIAGNOSIS — N898 Other specified noninflammatory disorders of vagina: Secondary | ICD-10-CM | POA: Diagnosis not present

## 2022-06-26 DIAGNOSIS — R350 Frequency of micturition: Secondary | ICD-10-CM | POA: Diagnosis not present

## 2022-06-27 ENCOUNTER — Other Ambulatory Visit (HOSPITAL_COMMUNITY): Payer: Self-pay

## 2022-06-29 DIAGNOSIS — M76821 Posterior tibial tendinitis, right leg: Secondary | ICD-10-CM | POA: Diagnosis not present

## 2022-07-03 ENCOUNTER — Other Ambulatory Visit (HOSPITAL_COMMUNITY): Payer: Self-pay

## 2022-07-03 MED ORDER — PROMETHAZINE HCL 25 MG PO TABS
25.0000 mg | ORAL_TABLET | Freq: Four times a day (QID) | ORAL | 0 refills | Status: AC | PRN
  Filled 2022-07-03: qty 20, 5d supply, fill #0

## 2022-07-03 MED ORDER — IBUPROFEN 800 MG PO TABS
800.0000 mg | ORAL_TABLET | Freq: Three times a day (TID) | ORAL | 1 refills | Status: AC | PRN
  Filled 2022-07-03: qty 30, 10d supply, fill #0

## 2022-07-03 MED ORDER — HYDROMORPHONE HCL 2 MG PO TABS
2.0000 mg | ORAL_TABLET | Freq: Four times a day (QID) | ORAL | 0 refills | Status: AC | PRN
  Filled 2022-07-03: qty 20, 3d supply, fill #0

## 2022-07-04 ENCOUNTER — Other Ambulatory Visit: Payer: Self-pay

## 2022-07-05 ENCOUNTER — Other Ambulatory Visit (HOSPITAL_COMMUNITY): Payer: Self-pay

## 2022-07-07 DIAGNOSIS — M19049 Primary osteoarthritis, unspecified hand: Secondary | ICD-10-CM | POA: Diagnosis not present

## 2022-07-07 DIAGNOSIS — D802 Selective deficiency of immunoglobulin A [IgA]: Secondary | ICD-10-CM | POA: Diagnosis not present

## 2022-07-07 DIAGNOSIS — M1711 Unilateral primary osteoarthritis, right knee: Secondary | ICD-10-CM | POA: Diagnosis not present

## 2022-07-07 DIAGNOSIS — M7072 Other bursitis of hip, left hip: Secondary | ICD-10-CM | POA: Diagnosis not present

## 2022-07-07 DIAGNOSIS — H6992 Unspecified Eustachian tube disorder, left ear: Secondary | ICD-10-CM | POA: Diagnosis not present

## 2022-07-18 ENCOUNTER — Other Ambulatory Visit (HOSPITAL_COMMUNITY): Payer: Self-pay

## 2022-07-18 DIAGNOSIS — H6992 Unspecified Eustachian tube disorder, left ear: Secondary | ICD-10-CM | POA: Diagnosis not present

## 2022-07-18 DIAGNOSIS — J0100 Acute maxillary sinusitis, unspecified: Secondary | ICD-10-CM | POA: Diagnosis not present

## 2022-07-18 DIAGNOSIS — H0289 Other specified disorders of eyelid: Secondary | ICD-10-CM | POA: Diagnosis not present

## 2022-07-18 DIAGNOSIS — H02843 Edema of right eye, unspecified eyelid: Secondary | ICD-10-CM | POA: Diagnosis not present

## 2022-08-04 ENCOUNTER — Other Ambulatory Visit (HOSPITAL_COMMUNITY): Payer: Self-pay

## 2022-08-05 DIAGNOSIS — R000 Tachycardia, unspecified: Secondary | ICD-10-CM | POA: Diagnosis not present

## 2022-08-05 DIAGNOSIS — R109 Unspecified abdominal pain: Secondary | ICD-10-CM | POA: Diagnosis not present

## 2022-08-05 DIAGNOSIS — R918 Other nonspecific abnormal finding of lung field: Secondary | ICD-10-CM | POA: Diagnosis not present

## 2022-08-06 DIAGNOSIS — R509 Fever, unspecified: Secondary | ICD-10-CM | POA: Diagnosis not present

## 2022-08-06 DIAGNOSIS — R519 Headache, unspecified: Secondary | ICD-10-CM | POA: Diagnosis not present

## 2022-08-06 DIAGNOSIS — R112 Nausea with vomiting, unspecified: Secondary | ICD-10-CM | POA: Diagnosis not present

## 2022-08-07 DIAGNOSIS — R109 Unspecified abdominal pain: Secondary | ICD-10-CM | POA: Diagnosis not present

## 2022-08-07 DIAGNOSIS — R2243 Localized swelling, mass and lump, lower limb, bilateral: Secondary | ICD-10-CM | POA: Diagnosis not present

## 2022-08-10 DIAGNOSIS — R109 Unspecified abdominal pain: Secondary | ICD-10-CM | POA: Diagnosis not present

## 2022-08-12 DIAGNOSIS — R935 Abnormal findings on diagnostic imaging of other abdominal regions, including retroperitoneum: Secondary | ICD-10-CM | POA: Diagnosis not present

## 2022-09-15 ENCOUNTER — Other Ambulatory Visit (HOSPITAL_BASED_OUTPATIENT_CLINIC_OR_DEPARTMENT_OTHER): Payer: Self-pay

## 2022-10-24 DIAGNOSIS — H40013 Open angle with borderline findings, low risk, bilateral: Secondary | ICD-10-CM | POA: Diagnosis not present

## 2022-11-13 ENCOUNTER — Other Ambulatory Visit (HOSPITAL_COMMUNITY): Payer: Self-pay
